# Patient Record
Sex: Male | Born: 1970
Health system: Southern US, Community
[De-identification: ages and names within clinical notes are randomized; demographics above are authoritative.]

## PROBLEM LIST (undated history)

## (undated) DIAGNOSIS — K219 Gastro-esophageal reflux disease without esophagitis: Secondary | ICD-10-CM

## (undated) DIAGNOSIS — E785 Hyperlipidemia, unspecified: Secondary | ICD-10-CM

## (undated) DIAGNOSIS — E669 Obesity, unspecified: Secondary | ICD-10-CM

## (undated) DIAGNOSIS — K5790 Diverticulosis of intestine, part unspecified, without perforation or abscess without bleeding: Secondary | ICD-10-CM

## (undated) HISTORY — DX: Hyperlipidemia, unspecified: E78.5

## (undated) HISTORY — DX: Gastro-esophageal reflux disease without esophagitis: K21.9

## (undated) HISTORY — DX: Obesity, unspecified: E66.9

## (undated) HISTORY — DX: Diverticulosis of intestine, part unspecified, without perforation or abscess without bleeding: K57.90

---

## 1997-05-28 ENCOUNTER — Emergency Department (HOSPITAL_COMMUNITY): Admission: EM | Admit: 1997-05-28 | Discharge: 1997-05-28 | Payer: Self-pay | Admitting: Emergency Medicine

## 1997-10-09 ENCOUNTER — Emergency Department (HOSPITAL_COMMUNITY): Admission: EM | Admit: 1997-10-09 | Discharge: 1997-10-09 | Payer: Self-pay | Admitting: Emergency Medicine

## 1997-11-03 ENCOUNTER — Emergency Department (HOSPITAL_COMMUNITY): Admission: EM | Admit: 1997-11-03 | Discharge: 1997-11-03 | Payer: Self-pay | Admitting: Emergency Medicine

## 1998-07-13 ENCOUNTER — Emergency Department (HOSPITAL_COMMUNITY): Admission: EM | Admit: 1998-07-13 | Discharge: 1998-07-13 | Payer: Self-pay | Admitting: Emergency Medicine

## 1998-12-21 ENCOUNTER — Emergency Department (HOSPITAL_COMMUNITY): Admission: EM | Admit: 1998-12-21 | Discharge: 1998-12-21 | Payer: Self-pay | Admitting: Emergency Medicine

## 1998-12-21 ENCOUNTER — Encounter: Payer: Self-pay | Admitting: Emergency Medicine

## 2000-01-08 ENCOUNTER — Encounter: Payer: Self-pay | Admitting: Emergency Medicine

## 2000-01-08 ENCOUNTER — Emergency Department (HOSPITAL_COMMUNITY): Admission: EM | Admit: 2000-01-08 | Discharge: 2000-01-08 | Payer: Self-pay | Admitting: Emergency Medicine

## 2000-02-10 ENCOUNTER — Emergency Department (HOSPITAL_COMMUNITY): Admission: EM | Admit: 2000-02-10 | Discharge: 2000-02-10 | Payer: Self-pay | Admitting: Emergency Medicine

## 2001-04-08 ENCOUNTER — Emergency Department (HOSPITAL_COMMUNITY): Admission: EM | Admit: 2001-04-08 | Discharge: 2001-04-08 | Payer: Self-pay | Admitting: Emergency Medicine

## 2001-12-03 ENCOUNTER — Emergency Department (HOSPITAL_COMMUNITY): Admission: EM | Admit: 2001-12-03 | Discharge: 2001-12-03 | Payer: Self-pay | Admitting: *Deleted

## 2002-10-25 ENCOUNTER — Emergency Department (HOSPITAL_COMMUNITY): Admission: EM | Admit: 2002-10-25 | Discharge: 2002-10-25 | Payer: Self-pay | Admitting: Emergency Medicine

## 2003-04-02 ENCOUNTER — Emergency Department (HOSPITAL_COMMUNITY): Admission: EM | Admit: 2003-04-02 | Discharge: 2003-04-02 | Payer: Self-pay | Admitting: Emergency Medicine

## 2003-06-26 ENCOUNTER — Emergency Department (HOSPITAL_COMMUNITY): Admission: EM | Admit: 2003-06-26 | Discharge: 2003-06-26 | Payer: Self-pay | Admitting: Emergency Medicine

## 2003-06-28 ENCOUNTER — Emergency Department (HOSPITAL_COMMUNITY): Admission: EM | Admit: 2003-06-28 | Discharge: 2003-06-29 | Payer: Self-pay | Admitting: Emergency Medicine

## 2004-03-17 ENCOUNTER — Emergency Department (HOSPITAL_COMMUNITY): Admission: EM | Admit: 2004-03-17 | Discharge: 2004-03-17 | Payer: Self-pay | Admitting: Emergency Medicine

## 2004-08-16 ENCOUNTER — Emergency Department (HOSPITAL_COMMUNITY): Admission: EM | Admit: 2004-08-16 | Discharge: 2004-08-16 | Payer: Self-pay | Admitting: Emergency Medicine

## 2005-07-20 ENCOUNTER — Emergency Department (HOSPITAL_COMMUNITY): Admission: EM | Admit: 2005-07-20 | Discharge: 2005-07-20 | Payer: Self-pay | Admitting: Emergency Medicine

## 2005-07-21 ENCOUNTER — Emergency Department (HOSPITAL_COMMUNITY): Admission: EM | Admit: 2005-07-21 | Discharge: 2005-07-22 | Payer: Self-pay | Admitting: *Deleted

## 2005-08-11 ENCOUNTER — Emergency Department (HOSPITAL_COMMUNITY): Admission: EM | Admit: 2005-08-11 | Discharge: 2005-08-11 | Payer: Self-pay | Admitting: Emergency Medicine

## 2005-08-16 ENCOUNTER — Emergency Department (HOSPITAL_COMMUNITY): Admission: EM | Admit: 2005-08-16 | Discharge: 2005-08-16 | Payer: Self-pay | Admitting: Emergency Medicine

## 2005-10-14 ENCOUNTER — Emergency Department (HOSPITAL_COMMUNITY): Admission: EM | Admit: 2005-10-14 | Discharge: 2005-10-15 | Payer: Self-pay | Admitting: Emergency Medicine

## 2006-01-28 ENCOUNTER — Emergency Department (HOSPITAL_COMMUNITY): Admission: EM | Admit: 2006-01-28 | Discharge: 2006-01-28 | Payer: Self-pay | Admitting: Emergency Medicine

## 2006-04-19 ENCOUNTER — Ambulatory Visit: Payer: Self-pay | Admitting: Internal Medicine

## 2006-05-01 ENCOUNTER — Ambulatory Visit: Payer: Self-pay | Admitting: *Deleted

## 2006-12-09 ENCOUNTER — Emergency Department (HOSPITAL_COMMUNITY): Admission: EM | Admit: 2006-12-09 | Discharge: 2006-12-09 | Payer: Self-pay | Admitting: Emergency Medicine

## 2010-10-26 ENCOUNTER — Emergency Department (HOSPITAL_COMMUNITY): Payer: Self-pay

## 2010-10-26 ENCOUNTER — Emergency Department (HOSPITAL_COMMUNITY)
Admission: EM | Admit: 2010-10-26 | Discharge: 2010-10-26 | Disposition: A | Discharge status: Home / Self Care | Payer: Self-pay | Attending: Emergency Medicine | Admitting: Emergency Medicine

## 2010-10-26 DIAGNOSIS — M79609 Pain in unspecified limb: Secondary | ICD-10-CM | POA: Insufficient documentation

## 2010-10-26 DIAGNOSIS — J45909 Unspecified asthma, uncomplicated: Secondary | ICD-10-CM | POA: Insufficient documentation

## 2010-11-24 LAB — GC/CHLAMYDIA PROBE AMP, GENITAL
Chlamydia, DNA Probe: NEGATIVE
GC Probe Amp, Genital: NEGATIVE

## 2010-11-24 LAB — URINALYSIS, ROUTINE W REFLEX MICROSCOPIC
Nitrite: NEGATIVE
Protein, ur: NEGATIVE
Specific Gravity, Urine: 1.027
Urobilinogen, UA: 1

## 2011-03-07 ENCOUNTER — Encounter (HOSPITAL_COMMUNITY): Payer: Self-pay | Admitting: Emergency Medicine

## 2011-03-07 ENCOUNTER — Emergency Department (HOSPITAL_COMMUNITY)
Admission: EM | Admit: 2011-03-07 | Discharge: 2011-03-07 | Disposition: A | Discharge status: Home / Self Care | Payer: Self-pay | Attending: Emergency Medicine | Admitting: Emergency Medicine

## 2011-03-07 DIAGNOSIS — N419 Inflammatory disease of prostate, unspecified: Secondary | ICD-10-CM | POA: Insufficient documentation

## 2011-03-07 DIAGNOSIS — R10819 Abdominal tenderness, unspecified site: Secondary | ICD-10-CM | POA: Insufficient documentation

## 2011-03-07 LAB — URINALYSIS, ROUTINE W REFLEX MICROSCOPIC
Bilirubin Urine: NEGATIVE
Glucose, UA: NEGATIVE mg/dL
Hgb urine dipstick: NEGATIVE
Ketones, ur: NEGATIVE mg/dL
Protein, ur: NEGATIVE mg/dL

## 2011-03-07 MED ORDER — CEFTRIAXONE SODIUM 250 MG IJ SOLR
250.0000 mg | Freq: Once | INTRAMUSCULAR | Status: AC
  Administered 2011-03-07: 250 mg via INTRAMUSCULAR
  Filled 2011-03-07: qty 250

## 2011-03-07 MED ORDER — LEVOFLOXACIN 750 MG PO TABS: 750.0000 mg | ORAL_TABLET | Freq: Every day | ORAL | Status: AC

## 2011-03-07 MED ORDER — LIDOCAINE HCL 1 % IJ SOLN
INTRAMUSCULAR | Status: AC
  Administered 2011-03-07: 20 mL
  Filled 2011-03-07: qty 20

## 2011-03-07 MED ORDER — AZITHROMYCIN 250 MG PO TABS
1000.0000 mg | ORAL_TABLET | Freq: Once | ORAL | Status: AC
  Administered 2011-03-07: 1000 mg via ORAL
  Filled 2011-03-07: qty 4

## 2011-03-07 NOTE — ED Notes (Signed)
Pt states his girlfriend tested positive for std and he wants to be checked  Pt states he has pain in his private area  Denies any discharge

## 2011-03-07 NOTE — ED Notes (Signed)
MD at bedside. EDP Ray

## 2011-03-07 NOTE — ED Notes (Signed)
Pt presents to ED w/concerns of possible STD, reports his ex girlfriend recently dx w/STD, pt reports small amt of d/c, burning w/urination, and lower abd pain. Pt unable to get in the STD clinic.

## 2011-03-07 NOTE — ED Provider Notes (Signed)
History     CSN: [id number]  Arrival date & time 03/07/11  7829   First MD Initiated Contact with Patient 03/07/11 2130      Chief Complaint  Patient presents with  . SEXUALLY TRANSMITTED DISEASE    (Consider location/radiation/quality/duration/timing/severity/associated sxs/prior treatment) HPI  Patient states she has had suprapubic pain for about 3 weeks. He has a history of prostatitis and feels that it is like this. He states that his girlfriend may have on tetanus TD and he is very concerned that he has an STD. He has not had any urethral discharge. He states that he has had some pain when he urinates. He has not had increased frequency of urination.  Past Medical History  Diagnosis Date  . Asthma     History reviewed. No pertinent past surgical history.  Family History  Problem Relation Age of Onset  . Hypertension Mother   . Diabetes Mother     History  Substance Use Topics  . Smoking status: Current Everyday Smoker    Types: Cigars  . Smokeless tobacco: Not on file  . Alcohol Use: Yes     occassional      Review of Systems  All other systems reviewed and are negative.    Allergies  Review of patient's allergies indicates no known allergies.  Home Medications  No current outpatient prescriptions on file.  BP 129/84  Pulse 78  Temp(Src) 98.4 F (36.9 C) (Oral)  Resp 18  SpO2 98%  Physical Exam  Nursing note and vitals reviewed. Constitutional: He is oriented to person, place, and time. He appears well-developed and well-nourished.  HENT:  Head: Normocephalic and atraumatic.  Eyes: Conjunctivae are normal. Pupils are equal, round, and reactive to light.  Neck: Normal range of motion.  Cardiovascular: Normal rate.   Pulmonary/Chest: Effort normal and breath sounds normal.  Abdominal: Soft. Bowel sounds are normal. There is tenderness.       Mild suprapubic tenderness  Genitourinary: Rectum normal and penis normal. Prostate is tender.    Musculoskeletal: Normal range of motion.  Neurological: He is alert and oriented to person, place, and time.  Skin: Skin is warm and dry.  Psychiatric: He has a normal mood and affect.    ED Course  Procedures (including critical care time)   Labs Reviewed  URINALYSIS, ROUTINE W REFLEX MICROSCOPIC   No results found.   No diagnosis found.    MDM  Plan to cover patient for STDs even possible exposure. He'll also be covered with Levaquin for 3 weeks given that he is 41 years old. A urine is sent and will be cultured. Will be referred to urology       Hilario Quarry, MD 03/09/11 [phone]

## 2011-03-10 NOTE — ED Notes (Signed)
Prescription called in to [facility] at [phone] for ciprofloxacin 500mg  po bid x28days per [facility], [facility]; no refills.

## 2011-12-08 ENCOUNTER — Emergency Department (HOSPITAL_COMMUNITY)
Admission: EM | Admit: 2011-12-08 | Discharge: 2011-12-09 | Disposition: A | Discharge status: Home / Self Care | Payer: Self-pay | Attending: Emergency Medicine | Admitting: Emergency Medicine

## 2011-12-08 ENCOUNTER — Emergency Department (HOSPITAL_COMMUNITY): Payer: Self-pay

## 2011-12-08 ENCOUNTER — Encounter (HOSPITAL_COMMUNITY): Payer: Self-pay | Admitting: Emergency Medicine

## 2011-12-08 DIAGNOSIS — J45901 Unspecified asthma with (acute) exacerbation: Secondary | ICD-10-CM

## 2011-12-08 DIAGNOSIS — F17200 Nicotine dependence, unspecified, uncomplicated: Secondary | ICD-10-CM | POA: Insufficient documentation

## 2011-12-08 MED ORDER — ALBUTEROL SULFATE HFA 108 (90 BASE) MCG/ACT IN AERS
1.0000 | INHALATION_SPRAY | Freq: Once | RESPIRATORY_TRACT | Status: AC
  Administered 2011-12-08: 1 via RESPIRATORY_TRACT
  Filled 2011-12-08: qty 6.7

## 2011-12-08 MED ORDER — ALBUTEROL SULFATE (5 MG/ML) 0.5% IN NEBU
5.0000 mg | INHALATION_SOLUTION | Freq: Once | RESPIRATORY_TRACT | Status: AC
  Administered 2011-12-08: 5 mg via RESPIRATORY_TRACT
  Filled 2011-12-08: qty 1

## 2011-12-08 MED ORDER — IPRATROPIUM BROMIDE 0.02 % IN SOLN
0.5000 mg | Freq: Once | RESPIRATORY_TRACT | Status: AC
  Administered 2011-12-08: 0.5 mg via RESPIRATORY_TRACT
  Filled 2011-12-08: qty 2.5

## 2011-12-08 MED ORDER — ALBUTEROL SULFATE HFA 108 (90 BASE) MCG/ACT IN AERS: 1.0000 | INHALATION_SPRAY | Freq: Four times a day (QID) | RESPIRATORY_TRACT | Status: DC | PRN

## 2011-12-08 NOTE — ED Provider Notes (Addendum)
History     CSN: [id number]  Arrival date & time 12/08/11  2049   First MD Initiated Contact with Patient 12/08/11 2100      Chief Complaint  Patient presents with  . Asthma    (Consider location/radiation/quality/duration/timing/severity/associated sxs/prior treatment) HPI Comments: Pt comes in with cc of wheezing. He has hx of asthma, but states that he hasnt had to use a breathing tx for years now. About 2 hours ago, he started having wheezing all of a sudden, with no chest pain or dib. He has certain seasonal allergies, but cant recall any specific exposures. There is no chest pain, no substance abuse, no cough.  Patient is a 41 y.o. male presenting with asthma. The history is provided by the patient.  Asthma Pertinent negatives include no chest pain, no abdominal pain and no shortness of breath.    Past Medical History  Diagnosis Date  . Asthma     History reviewed. No pertinent past surgical history.  Family History  Problem Relation Age of Onset  . Hypertension Mother   . Diabetes Mother     History  Substance Use Topics  . Smoking status: Current Every Day Smoker    Types: Cigars  . Smokeless tobacco: Not on file  . Alcohol Use: Yes     occassional      Review of Systems  Constitutional: Negative for activity change and appetite change.  Respiratory: Positive for wheezing. Negative for cough and shortness of breath.   Cardiovascular: Negative for chest pain.  Gastrointestinal: Negative for abdominal pain.  Genitourinary: Negative for dysuria.    Allergies  Review of patient's allergies indicates no known allergies.  Home Medications  No current outpatient prescriptions on file.  BP 167/98  Pulse 87  Temp 98.3 F (36.8 C) (Oral)  Resp 16  SpO2 95%  Physical Exam  Nursing note and vitals reviewed. Constitutional: He is oriented to person, place, and time. He appears well-developed.  HENT:  Head: Normocephalic and atraumatic.  Eyes:  Conjunctivae normal and EOM are normal. Pupils are equal, round, and reactive to light.  Neck: Normal range of motion. Neck supple.  Cardiovascular: Normal rate and regular rhythm.   Pulmonary/Chest: Effort normal. He has wheezes.       Bilateral lower lobe wheezing s/p 1 breathing treatment  Abdominal: Soft. Bowel sounds are normal. He exhibits no distension. There is no tenderness. There is no rebound and no guarding.  Neurological: He is alert and oriented to person, place, and time.  Skin: Skin is warm.    ED Course  Procedures (including critical care time)  Labs Reviewed - No data to display Dg Chest 2 View  12/08/2011  *RADIOLOGY REPORT*  Clinical Data: Asthma, chest pain, cough and wheezing.  CHEST - 2 VIEW  Comparison: 06/26/2003  Findings: Hypoaeration with interstitial and vascular crowding.  No confluent airspace opacity.  No pleural effusion or pneumothorax. Cardiomediastinal contours within normal range.  No acute osseous finding.  IMPRESSION: Hypoaerated lungs without focal consolidation.   Original Report Authenticated By: Waneta Martins, M.D.      No diagnosis found.    MDM  DDX: Asthma exacerbation Allergy Bronchitis CHF Pneumonia PE  Pt comes in with cc of wheezing. Pt is s/p 1 treatment, and he feels a lot better. He still has mild wheezing. Given that he never took any treatment, and his exam shows no extremis, IF HE IS WHEEZING FREE AFTER 2 TREATMENTS, we will not call him asthma exacerbation  and he will not get steroids. PE ruled out per [facility] criteria.  Derwood Kaplan, MD 12/08/11 2240  Xray negative, post 2nd neb treatment, he is wheezing free. Has no insurance, will give him albuterol inhaler.  Derwood Kaplan, MD 12/08/11 2242

## 2011-12-08 NOTE — ED Notes (Signed)
PT. REPORTS ASTHMA ATTACK TODAY WITH SOB AND OCCASIONAL DRY COUGH / FAINT WHEEZING .

## 2011-12-12 ENCOUNTER — Encounter (HOSPITAL_COMMUNITY): Payer: Self-pay | Admitting: Cardiology

## 2011-12-12 ENCOUNTER — Emergency Department (HOSPITAL_COMMUNITY)
Admission: EM | Admit: 2011-12-12 | Discharge: 2011-12-12 | Disposition: A | Discharge status: Home / Self Care | Payer: Self-pay | Attending: Emergency Medicine | Admitting: Emergency Medicine

## 2011-12-12 DIAGNOSIS — F17200 Nicotine dependence, unspecified, uncomplicated: Secondary | ICD-10-CM | POA: Insufficient documentation

## 2011-12-12 DIAGNOSIS — J45901 Unspecified asthma with (acute) exacerbation: Secondary | ICD-10-CM | POA: Insufficient documentation

## 2011-12-12 MED ORDER — IPRATROPIUM BROMIDE 0.02 % IN SOLN
0.5000 mg | Freq: Once | RESPIRATORY_TRACT | Status: AC
  Administered 2011-12-12: 0.5 mg via RESPIRATORY_TRACT
  Filled 2011-12-12: qty 2.5

## 2011-12-12 MED ORDER — DEXAMETHASONE 4 MG PO TABS: 16.0000 mg | ORAL_TABLET | Freq: Once | ORAL | Status: DC

## 2011-12-12 MED ORDER — ALBUTEROL SULFATE (5 MG/ML) 0.5% IN NEBU
5.0000 mg | INHALATION_SOLUTION | Freq: Once | RESPIRATORY_TRACT | Status: AC
  Administered 2011-12-12: 5 mg via RESPIRATORY_TRACT
  Filled 2011-12-12: qty 1

## 2011-12-12 MED ORDER — ALBUTEROL SULFATE HFA 108 (90 BASE) MCG/ACT IN AERS
2.0000 | INHALATION_SPRAY | Freq: Once | RESPIRATORY_TRACT | Status: AC
  Administered 2011-12-12: 2 via RESPIRATORY_TRACT
  Filled 2011-12-12: qty 6.7

## 2011-12-12 MED ORDER — DEXAMETHASONE 2 MG PO TABS
16.0000 mg | ORAL_TABLET | Freq: Once | ORAL | Status: AC
  Administered 2011-12-12: 16 mg via ORAL
  Filled 2011-12-12: qty 8

## 2011-12-12 NOTE — ED Notes (Signed)
Pt reports increased SOB that started over the night. Reports he uses an inhaler but it has not been working. Pt speaking in complete sentences. No distress noted. Pt ambulatory at triage.

## 2011-12-12 NOTE — ED Provider Notes (Signed)
History  This chart was scribed for Raeford Razor, MD by Shari Heritage. The patient was seen in room TR10C/TR10C. Patient's care was started at 1716.    CSN: [id number]  Arrival date & time 12/12/11  1716   First MD Initiated Contact with Patient 12/12/11 1716      Chief Complaint  Patient presents with  . Asthma    Patient is a 41 y.o. male presenting with shortness of breath. The history is provided by the patient. No language interpreter was used.  Shortness of Breath  The current episode started yesterday. The onset was sudden. The problem occurs frequently. The problem is moderate. Associated symptoms include shortness of breath. Pertinent negatives include no fever. His past medical history is significant for asthma.    Jacob Williamson is a 41 y.o. male with a history of asthma who presents to the Emergency Department complaining of moderate, intermittent SOB. He denies fever, chills, cough, leg pain, or leg swelling onset last night. He states that he has not had asthmatic symptoms in the past several  years. He was seen here on 10/24 for complaints of wheezing and was given an inhaler.. After treatment in the ED patient had relief for a few hours but symptoms returned. He reports no other significant past medical history. Patient has a history of smoking.       Past Medical History  Diagnosis Date  . Asthma     History reviewed. No pertinent past surgical history.  Family History  Problem Relation Age of Onset  . Hypertension Mother   . Diabetes Mother     History  Substance Use Topics  . Smoking status: Current Every Day Smoker    Types: Cigars  . Smokeless tobacco: Not on file  . Alcohol Use: Yes     occassional      Review of Systems  Constitutional: Negative for fever, chills and fatigue.  Respiratory: Positive for shortness of breath.   All other systems reviewed and are negative.    Allergies  Catfish and Other  Home Medications   Current  Outpatient Rx  Name Route Sig Dispense Refill  . ALBUTEROL SULFATE HFA 108 (90 BASE) MCG/ACT IN AERS Inhalation Inhale 1-2 puffs into the lungs every 6 (six) hours as needed. For shortness of breath/wheezing      BP 125/102  Pulse 110  Temp 98 F (36.7 C) (Oral)  Resp 18  SpO2 100%  Physical Exam  Nursing note and vitals reviewed. Constitutional: He appears well-developed and well-nourished. No distress.  HENT:  Head: Normocephalic and atraumatic.  Eyes: Conjunctivae normal are normal. Right eye exhibits no discharge. Left eye exhibits no discharge.  Neck: Neck supple.  Cardiovascular: Normal rate, regular rhythm and normal heart sounds.  Exam reveals no gallop and no friction rub.   No murmur heard. Pulmonary/Chest: Effort normal. No respiratory distress. He has wheezes (Mild ).       Mild expiratory wheezes. Patient is speaking normally.   Abdominal: Soft. He exhibits no distension. There is no tenderness.  Musculoskeletal: He exhibits no edema and no tenderness.  Neurological: He is alert.  Skin: Skin is warm and dry.  Psychiatric: He has a normal mood and affect. His behavior is normal. Thought content normal.    ED Course  Procedures (including critical care time) DIAGNOSTIC STUDIES: Oxygen Saturation is 100% on room air, normal by my interpretation.    COORDINATION OF CARE:  5:30pm-Patient informed of current plan for treatment and evaluation and  agrees with plan at this time.    Labs Reviewed - No data to display No results found.   1. Asthma exacerbation       MDM  41yM with mild dyspnea and wheezing on exam. Consistent with mild asthma exacerbation. Seen recently for same on not started on steroids. Will give course given ongoing symptoms. Afebrile and no respiratory distress. Imaging and labs deferred. PE considered but doubt. Pt somewhat anxious because wheezing not completely cleared. Remains in no respiratory distress on repeat exam. Given reassurance.  Return precautions discussed.  I personally preformed the services scribed in my presence. The recorded information has been reviewed and considered. Raeford Razor, MD.       Raeford Razor, MD 12/12/11 [phone]

## 2012-02-28 ENCOUNTER — Emergency Department (HOSPITAL_COMMUNITY)
Admission: EM | Admit: 2012-02-28 | Discharge: 2012-02-28 | Disposition: A | Discharge status: Home / Self Care | Payer: Self-pay | Attending: Emergency Medicine | Admitting: Emergency Medicine

## 2012-02-28 ENCOUNTER — Encounter (HOSPITAL_COMMUNITY): Payer: Self-pay | Admitting: *Deleted

## 2012-02-28 DIAGNOSIS — J45901 Unspecified asthma with (acute) exacerbation: Secondary | ICD-10-CM | POA: Insufficient documentation

## 2012-02-28 DIAGNOSIS — R062 Wheezing: Secondary | ICD-10-CM

## 2012-02-28 DIAGNOSIS — F17200 Nicotine dependence, unspecified, uncomplicated: Secondary | ICD-10-CM | POA: Insufficient documentation

## 2012-02-28 DIAGNOSIS — R0602 Shortness of breath: Secondary | ICD-10-CM | POA: Insufficient documentation

## 2012-02-28 MED ORDER — ALBUTEROL SULFATE (5 MG/ML) 0.5% IN NEBU
INHALATION_SOLUTION | RESPIRATORY_TRACT | Status: AC
  Administered 2012-02-28: 21:00:00
  Filled 2012-02-28: qty 1

## 2012-02-28 MED ORDER — ALBUTEROL SULFATE (5 MG/ML) 0.5% IN NEBU
5.0000 mg | INHALATION_SOLUTION | Freq: Once | RESPIRATORY_TRACT | Status: AC
  Administered 2012-02-28: 5 mg via RESPIRATORY_TRACT

## 2012-02-28 MED ORDER — ALBUTEROL SULFATE HFA 108 (90 BASE) MCG/ACT IN AERS: 1.0000 | INHALATION_SPRAY | Freq: Four times a day (QID) | RESPIRATORY_TRACT | Status: DC | PRN

## 2012-02-28 NOTE — ED Notes (Signed)
Called in lobby at 19:40 with no response.

## 2012-02-28 NOTE — ED Notes (Signed)
Pt with hx of asthma to ED c/o acute onset sob and wheezing while at work d/t dust.  Pt o2 sats of 97, but with audible wheezing.

## 2012-02-28 NOTE — ED Provider Notes (Signed)
History   This chart was scribed for non-physician practitioner working with Dione Booze, MD by Sofie Rower, ED Scribe. This patient was seen in room TR11C/TR11C and the patient's care was started at 9:06PM.   CSN: [id number]  Arrival date & time 02/28/12  1821   First MD Initiated Contact with Patient 02/28/12 2106      Chief Complaint  Patient presents with  . Asthma    (Consider location/radiation/quality/duration/timing/severity/associated sxs/prior treatment) Patient is a 42 y.o. male presenting with asthma and general illness. The history is provided by the patient. No language interpreter was used.  Asthma This is a recurrent problem. The problem has been gradually worsening. Associated symptoms include shortness of breath. The symptoms are aggravated by exertion. The symptoms are relieved by medications (benadryl). He has tried nothing for the symptoms. The treatment provided no relief.  Illness  The current episode started today. The onset was sudden. The problem occurs rarely. The problem has been gradually worsening. The problem is moderate. The symptoms are relieved by one or more OTC medications. The symptoms are aggravated by activity. Associated symptoms include wheezing.    Jacob Williamson is a 42 y.o. male , with a hx of asthma, who presents to the Emergency Department complaining of sudden, progressively worsening, asthma, onset today (02/28/12).  Associated symptoms include wheezing. The pt reports he was at his place of employment this afternoon, where he believes he experienced an asthma attack. This event prompted the pt's concern and desire to recieve medical evaluation at [facility] this evening. The pt has taken benadryl in the past, which he informs, he believes has prevented his asthma attacks.   The pt denies taking any applications of albuterol inhaler rescue treatments due to financial restrictions.   The pt is a current everyday smoker, in addition to drinking  alcohol occasionally.   PCP is Dr. Bruna Potter.     Past Medical History  Diagnosis Date  . Asthma     History reviewed. No pertinent past surgical history.  Family History  Problem Relation Age of Onset  . Hypertension Mother   . Diabetes Mother     History  Substance Use Topics  . Smoking status: Current Every Day Smoker    Types: Cigars  . Smokeless tobacco: Not on file  . Alcohol Use: Yes     Comment: occassional      Review of Systems  Respiratory: Positive for shortness of breath and wheezing.   All other systems reviewed and are negative.    Allergies  Shellfish allergy; Catfish; and Other  Home Medications   Current Outpatient Rx  Name  Route  Sig  Dispense  Refill  . DIPHENHYDRAMINE HCL 25 MG PO TABS   Oral   Take 25 mg by mouth 2 (two) times daily as needed. For reaction-inducing asthma           BP 138/110  Pulse 93  Temp 98.5 F (36.9 C) (Oral)  Resp 24  SpO2 97%  Physical Exam  Nursing note and vitals reviewed. Constitutional: He is oriented to person, place, and time. He appears well-developed and well-nourished.  HENT:  Head: Atraumatic.  Nose: Nose normal.  Neck: Normal range of motion.  Cardiovascular: Normal rate, regular rhythm and normal heart sounds.   Pulmonary/Chest: Effort normal and breath sounds normal. He has no wheezes.  Abdominal: Soft. Bowel sounds are normal.  Musculoskeletal: Normal range of motion.  Neurological: He is alert and oriented to person, place, and time.  Skin: Skin is warm and dry.  Psychiatric: He has a normal mood and affect. His behavior is normal.    ED Course  Procedures (including critical care time)  DIAGNOSTIC STUDIES: Oxygen Saturation is 97% on room air, normal by my interpretation.    COORDINATION OF CARE:  9:14 PM- Treatment plan concerning application of albuterol inhaler discussed with patient. Pt agrees with treatment.      Labs Reviewed - No data to display No results  found.   No diagnosis found.  Episode of wheezing after exposure to dust at work.  Initial symptoms have totally resolve.  Patient is followed by the [facility] for ongoing care.  Is currently out of his inhaler.  Refill prescription provided.  MDM    I personally performed the services described in this documentation, which was scribed in my presence. The recorded information has been reviewed and is accurate.        Jimmye Norman, NP 02/29/12 0010

## 2012-02-28 NOTE — ED Notes (Signed)
Pt returned from getting food.  Pt ambulatory back to [facility]

## 2012-02-29 NOTE — ED Provider Notes (Signed)
Medical screening examination/treatment/procedure(s) were performed by non-physician practitioner and as supervising physician I was immediately available for consultation/collaboration.   Dione Booze, MD 02/29/12 [phone]

## 2012-03-01 ENCOUNTER — Emergency Department (HOSPITAL_COMMUNITY): Payer: Self-pay

## 2012-03-01 ENCOUNTER — Encounter (HOSPITAL_COMMUNITY): Payer: Self-pay | Admitting: Adult Health

## 2012-03-01 ENCOUNTER — Emergency Department (HOSPITAL_COMMUNITY)
Admission: EM | Admit: 2012-03-01 | Discharge: 2012-03-01 | Disposition: A | Discharge status: Home / Self Care | Payer: Self-pay | Attending: Emergency Medicine | Admitting: Emergency Medicine

## 2012-03-01 DIAGNOSIS — R062 Wheezing: Secondary | ICD-10-CM | POA: Insufficient documentation

## 2012-03-01 DIAGNOSIS — Z79899 Other long term (current) drug therapy: Secondary | ICD-10-CM | POA: Insufficient documentation

## 2012-03-01 DIAGNOSIS — J45901 Unspecified asthma with (acute) exacerbation: Secondary | ICD-10-CM | POA: Insufficient documentation

## 2012-03-01 DIAGNOSIS — F17200 Nicotine dependence, unspecified, uncomplicated: Secondary | ICD-10-CM | POA: Insufficient documentation

## 2012-03-01 MED ORDER — ALBUTEROL (5 MG/ML) CONTINUOUS INHALATION SOLN: 20.0000 mg | INHALATION_SOLUTION | Freq: Once | RESPIRATORY_TRACT | Status: DC

## 2012-03-01 MED ORDER — ALBUTEROL SULFATE (5 MG/ML) 0.5% IN NEBU: 2.5000 mg | INHALATION_SOLUTION | Freq: Once | RESPIRATORY_TRACT | Status: DC

## 2012-03-01 MED ORDER — IPRATROPIUM BROMIDE 0.02 % IN SOLN
0.5000 mg | Freq: Once | RESPIRATORY_TRACT | Status: AC
  Administered 2012-03-01: 0.5 mg via RESPIRATORY_TRACT

## 2012-03-01 MED ORDER — ALBUTEROL (5 MG/ML) CONTINUOUS INHALATION SOLN
20.0000 mg | INHALATION_SOLUTION | Freq: Once | RESPIRATORY_TRACT | Status: AC
  Administered 2012-03-01: 20 mg via RESPIRATORY_TRACT
  Filled 2012-03-01: qty 20

## 2012-03-01 MED ORDER — METHYLPREDNISOLONE SODIUM SUCC 125 MG IJ SOLR
125.0000 mg | Freq: Once | INTRAMUSCULAR | Status: AC
  Administered 2012-03-01: 125 mg via INTRAVENOUS
  Filled 2012-03-01: qty 2

## 2012-03-01 MED ORDER — IPRATROPIUM BROMIDE 0.02 % IN SOLN
RESPIRATORY_TRACT | Status: AC
  Filled 2012-03-01: qty 2.5

## 2012-03-01 MED ORDER — ALBUTEROL SULFATE (5 MG/ML) 0.5% IN NEBU
INHALATION_SOLUTION | RESPIRATORY_TRACT | Status: AC
  Filled 2012-03-01: qty 1

## 2012-03-01 MED ORDER — ALBUTEROL SULFATE HFA 108 (90 BASE) MCG/ACT IN AERS
2.0000 | INHALATION_SPRAY | Freq: Once | RESPIRATORY_TRACT | Status: AC
  Administered 2012-03-01: 2 via RESPIRATORY_TRACT
  Filled 2012-03-01: qty 6.7

## 2012-03-01 MED ORDER — ALBUTEROL SULFATE (5 MG/ML) 0.5% IN NEBU
5.0000 mg | INHALATION_SOLUTION | Freq: Once | RESPIRATORY_TRACT | Status: AC
  Administered 2012-03-01: 5 mg via RESPIRATORY_TRACT

## 2012-03-01 MED ORDER — PREDNISONE 20 MG PO TABS: ORAL_TABLET | ORAL | Status: DC

## 2012-03-01 NOTE — ED Provider Notes (Signed)
History     CSN: [id number]  Arrival date & time 03/01/12  0054   First MD Initiated Contact with Patient 03/01/12 [phone]      Chief Complaint  Patient presents with  . Asthma    (Consider location/radiation/quality/duration/timing/severity/associated sxs/prior treatment) Patient is a 42 y.o. male presenting with asthma. The history is provided by the patient. No language interpreter was used.  Asthma This is a recurrent problem. The current episode started 3 to 5 hours ago. The problem occurs constantly. The problem has not changed since onset.Pertinent negatives include no chest pain, no abdominal pain and no headaches. Nothing aggravates the symptoms. Nothing relieves the symptoms. He has tried nothing for the symptoms. The treatment provided no relief.    Past Medical History  Diagnosis Date  . Asthma     History reviewed. No pertinent past surgical history.  Family History  Problem Relation Age of Onset  . Hypertension Mother   . Diabetes Mother     History  Substance Use Topics  . Smoking status: Current Every Day Smoker    Types: Cigars  . Smokeless tobacco: Not on file  . Alcohol Use: Yes     Comment: occassional      Review of Systems  Constitutional: Negative for fever.  Respiratory: Positive for wheezing. Negative for cough and stridor.   Cardiovascular: Negative for chest pain.  Gastrointestinal: Negative for abdominal pain.  Neurological: Negative for headaches.  All other systems reviewed and are negative.    Allergies  Shellfish allergy; Catfish; and Other  Home Medications   Current Outpatient Rx  Name  Route  Sig  Dispense  Refill  . DIPHENHYDRAMINE HCL 25 MG PO TABS   Oral   Take 25 mg by mouth 2 (two) times daily as needed. For reaction-inducing asthma         . ALBUTEROL SULFATE HFA 108 (90 BASE) MCG/ACT IN AERS   Inhalation   Inhale 1-2 puffs into the lungs every 6 (six) hours as needed for wheezing.   1 Inhaler   0     BP  146/103  Pulse 95  Temp 98.2 F (36.8 C) (Oral)  Resp 20  SpO2 98%  Physical Exam  Constitutional: He is oriented to person, place, and time. He appears well-developed and well-nourished.  HENT:  Head: Normocephalic and atraumatic.  Mouth/Throat: Oropharynx is clear and moist.  Eyes: Conjunctivae normal are normal. Pupils are equal, round, and reactive to light.  Neck: Normal range of motion.  Cardiovascular: Normal rate, regular rhythm and intact distal pulses.   Pulmonary/Chest: No stridor. He has wheezes. He has no rales.  Abdominal: Soft. Bowel sounds are normal. There is no tenderness. There is no rebound and no guarding.  Musculoskeletal: Normal range of motion. He exhibits no edema.  Lymphadenopathy:    He has no cervical adenopathy.  Neurological: He is alert and oriented to person, place, and time.  Skin: Skin is warm and dry. He is not diaphoretic.  Psychiatric: He has a normal mood and affect.    ED Course  Procedures (including critical care time)  Labs Reviewed - No data to display Dg Chest [facility] 1 View  03/01/2012  *RADIOLOGY REPORT*  Clinical Data: Chest tightness, cough, wheezing.  PORTABLE CHEST - 1 VIEW  Comparison: 12/08/2011  Findings: Lungs clear.  Heart size and pulmonary vascularity normal.  No effusion.  Visualized bones unremarkable.  IMPRESSION: No acute disease   Original Report Authenticated By: D. Andria Rhein, MD  No diagnosis found.    MDM  Asthma exacerbation. Clear post nebs teach and treat done with spacer.  Will d/c with same and steroids        Jefferie Holston K Madden Garron-Rasch, MD 03/01/12 [phone]

## 2012-03-01 NOTE — ED Notes (Signed)
Respiratory in with pt

## 2012-03-01 NOTE — ED Notes (Signed)
Presents with Asthma, bilateral breath sounds diminished, faint expiratory wheezes, pt is tripoding and using abdominal muscles to breath. Able to speak in short phrases.

## 2012-06-18 ENCOUNTER — Emergency Department (HOSPITAL_COMMUNITY)
Admission: EM | Admit: 2012-06-18 | Discharge: 2012-06-18 | Disposition: A | Discharge status: Home / Self Care | Payer: 59 | Attending: Emergency Medicine | Admitting: Emergency Medicine

## 2012-06-18 ENCOUNTER — Encounter (HOSPITAL_COMMUNITY): Payer: Self-pay | Admitting: Emergency Medicine

## 2012-06-18 DIAGNOSIS — J3489 Other specified disorders of nose and nasal sinuses: Secondary | ICD-10-CM | POA: Insufficient documentation

## 2012-06-18 DIAGNOSIS — F17200 Nicotine dependence, unspecified, uncomplicated: Secondary | ICD-10-CM | POA: Insufficient documentation

## 2012-06-18 DIAGNOSIS — H5789 Other specified disorders of eye and adnexa: Secondary | ICD-10-CM | POA: Insufficient documentation

## 2012-06-18 DIAGNOSIS — J45901 Unspecified asthma with (acute) exacerbation: Secondary | ICD-10-CM

## 2012-06-18 DIAGNOSIS — Z79899 Other long term (current) drug therapy: Secondary | ICD-10-CM | POA: Insufficient documentation

## 2012-06-18 DIAGNOSIS — R0602 Shortness of breath: Secondary | ICD-10-CM | POA: Insufficient documentation

## 2012-06-18 DIAGNOSIS — J302 Other seasonal allergic rhinitis: Secondary | ICD-10-CM

## 2012-06-18 DIAGNOSIS — IMO0002 Reserved for concepts with insufficient information to code with codable children: Secondary | ICD-10-CM | POA: Insufficient documentation

## 2012-06-18 MED ORDER — ALBUTEROL SULFATE (5 MG/ML) 0.5% IN NEBU
5.0000 mg | INHALATION_SOLUTION | Freq: Once | RESPIRATORY_TRACT | Status: AC
  Administered 2012-06-18: 5 mg via RESPIRATORY_TRACT

## 2012-06-18 MED ORDER — LORATADINE 10 MG PO TABS: 10.0000 mg | ORAL_TABLET | Freq: Every day | ORAL | Status: DC

## 2012-06-18 MED ORDER — PREDNISONE 20 MG PO TABS
60.0000 mg | ORAL_TABLET | Freq: Once | ORAL | Status: AC
  Administered 2012-06-18: 60 mg via ORAL
  Filled 2012-06-18: qty 3

## 2012-06-18 MED ORDER — ALBUTEROL SULFATE HFA 108 (90 BASE) MCG/ACT IN AERS
2.0000 | INHALATION_SPRAY | RESPIRATORY_TRACT | Status: DC | PRN
  Administered 2012-06-18: 2 via RESPIRATORY_TRACT
  Filled 2012-06-18: qty 6.7

## 2012-06-18 MED ORDER — LORATADINE 10 MG PO TABS
10.0000 mg | ORAL_TABLET | Freq: Every day | ORAL | Status: DC
  Administered 2012-06-18: 10 mg via ORAL
  Filled 2012-06-18: qty 1

## 2012-06-18 MED ORDER — PREDNISONE 50 MG PO TABS: 50.0000 mg | ORAL_TABLET | Freq: Every day | ORAL | Status: DC

## 2012-06-18 MED ORDER — ALBUTEROL SULFATE HFA 108 (90 BASE) MCG/ACT IN AERS: 1.0000 | INHALATION_SPRAY | Freq: Four times a day (QID) | RESPIRATORY_TRACT | Status: DC | PRN

## 2012-06-18 MED ORDER — AEROCHAMBER PLUS W/MASK MISC
1.0000 | Freq: Once | Status: AC
  Administered 2012-06-18: 1
  Filled 2012-06-18: qty 1

## 2012-06-18 MED ORDER — IPRATROPIUM BROMIDE 0.02 % IN SOLN
0.5000 mg | RESPIRATORY_TRACT | Status: DC
  Administered 2012-06-18: 0.5 mg via RESPIRATORY_TRACT
  Filled 2012-06-18: qty 2.5

## 2012-06-18 MED ORDER — ALBUTEROL SULFATE (5 MG/ML) 0.5% IN NEBU
5.0000 mg | INHALATION_SOLUTION | RESPIRATORY_TRACT | Status: DC
  Administered 2012-06-18: 5 mg via RESPIRATORY_TRACT
  Filled 2012-06-18 (×2): qty 0.5

## 2012-06-18 MED ORDER — ALBUTEROL SULFATE (5 MG/ML) 0.5% IN NEBU
INHALATION_SOLUTION | RESPIRATORY_TRACT | Status: AC
  Administered 2012-06-18: 5 mg via RESPIRATORY_TRACT
  Filled 2012-06-18: qty 1

## 2012-06-18 NOTE — ED Notes (Signed)
PT. REPORTS SOB WITH AUDIBLE WHEEZING ONSET THIS EVENING , PT. STATES HE RAN OUT OF [facility] . NO COUGH OR CONGESTION , DENIES FEVER OR CHILLS.

## 2012-06-18 NOTE — ED Provider Notes (Signed)
History     CSN: [id number]  Arrival date & time 06/18/12  0025   First MD Initiated Contact with Patient 06/18/12 0231      Chief Complaint  Patient presents with  . Asthma    HPI Jacob Williamson is a 42 y.o. male with a history of asthma who says he ran out of his asthma inhaler. Patient does have a followup appointment with Dr. Bruna Potter but not for another week. Patient says he notices his symptoms are worse in seasons change he also associated with rhinorrhea, itchy watery eyes. Patient has been taking Allegra and Claritin sporadically.  Shortness of breath and wheezing acutely worsened tonight, moderate, not associated with chest pain, fevers or chills, nausea vomiting diarrhea, abdominal pain, myalgias or arthralgias.   Past Medical History  Diagnosis Date  . Asthma     History reviewed. No pertinent past surgical history.  Family History  Problem Relation Age of Onset  . Hypertension Mother   . Diabetes Mother     History  Substance Use Topics  . Smoking status: Current Every Day Smoker    Types: Cigars  . Smokeless tobacco: Not on file  . Alcohol Use: Yes     Comment: occassional      Review of Systems At least 10pt or greater review of systems completed and are negative except where specified in the HPI.  Allergies  Shellfish allergy; Catfish; and Other  Home Medications   Current Outpatient Rx  Name  Route  Sig  Dispense  Refill  . albuterol (PROVENTIL HFA;VENTOLIN HFA) 108 (90 BASE) MCG/ACT inhaler   Inhalation   Inhale 1-2 puffs into the lungs every 6 (six) hours as needed for wheezing.   1 Inhaler   0   . loratadine (CLARITIN) 10 MG tablet   Oral   Take 1 tablet (10 mg total) by mouth daily.   30 tablet   0   . predniSONE (DELTASONE) 50 MG tablet   Oral   Take 1 tablet (50 mg total) by mouth daily.   5 tablet   0     BP 157/113  Pulse 93  Temp(Src) 98 F (36.7 C) (Oral)  Resp 14  SpO2 94%  Physical Exam  Nursing notes  reviewed.  Electronic medical record reviewed. VITAL SIGNS:   Filed Vitals:   06/18/12 0054 06/18/12 0329  BP: 157/113   Pulse: 93   Temp: 98 F (36.7 C)   TempSrc: Oral   Resp: 14   SpO2: 96% 94%   CONSTITUTIONAL: Awake, oriented, appears non-toxic HENT: Atraumatic, normocephalic, oral mucosa pink and moist, airway patent. Nares patent without drainage. External ears normal. EYES: Conjunctiva clear, EOMI, PERRLA NECK: Trachea midline, non-tender, supple CARDIOVASCULAR: Normal heart rate, Normal rhythm, No murmurs, rubs, gallops PULMONARY/CHEST: Fair air movement bilaterally, wheezing throughout. Symmetrical breath sounds. Non-tender. ABDOMINAL: Non-distended, soft, non-tender - no rebound or guarding.  BS normal. NEUROLOGIC: Non-focal, moving all four extremities, no gross sensory or motor deficits. EXTREMITIES: No clubbing, cyanosis, or edema SKIN: Warm, Dry, No erythema, No rash  ED Course  Procedures (including critical care time)  Labs Reviewed - No data to display No results found.   1. Asthma exacerbation   2. Seasonal allergies       MDM  Patient presents with asthma exacerbation likely touched off by some seasonal allergies, he's also out of his medications. Advised the patient to stay on an antihistamine to prevent asthmatic cascade, we'll start him on prednisone give him  another inhaler and prescription for inhaler. Told him to discuss further treatment with Dr.[facility], as he will need to be on a long acting beta agonist, likely inhaled steroid controller medication. No evidence for pneumonia.         Jones Skene, MD 06/18/12 [phone]

## 2013-11-20 ENCOUNTER — Emergency Department (HOSPITAL_BASED_OUTPATIENT_CLINIC_OR_DEPARTMENT_OTHER)
Admission: EM | Admit: 2013-11-20 | Discharge: 2013-11-20 | Disposition: A | Discharge status: Home / Self Care | Payer: 59 | Attending: Emergency Medicine | Admitting: Emergency Medicine

## 2013-11-20 ENCOUNTER — Encounter (HOSPITAL_BASED_OUTPATIENT_CLINIC_OR_DEPARTMENT_OTHER): Payer: Self-pay | Admitting: Emergency Medicine

## 2013-11-20 DIAGNOSIS — Z720 Tobacco use: Secondary | ICD-10-CM | POA: Insufficient documentation

## 2013-11-20 DIAGNOSIS — J45901 Unspecified asthma with (acute) exacerbation: Secondary | ICD-10-CM | POA: Insufficient documentation

## 2013-11-20 DIAGNOSIS — Z7952 Long term (current) use of systemic steroids: Secondary | ICD-10-CM | POA: Insufficient documentation

## 2013-11-20 DIAGNOSIS — Z79899 Other long term (current) drug therapy: Secondary | ICD-10-CM | POA: Insufficient documentation

## 2013-11-20 MED ORDER — ALBUTEROL SULFATE HFA 108 (90 BASE) MCG/ACT IN AERS
2.0000 | INHALATION_SPRAY | RESPIRATORY_TRACT | Status: DC
  Administered 2013-11-20: 2 via RESPIRATORY_TRACT
  Filled 2013-11-20: qty 6.7

## 2013-11-20 MED ORDER — ALBUTEROL SULFATE (2.5 MG/3ML) 0.083% IN NEBU
5.0000 mg | INHALATION_SOLUTION | Freq: Once | RESPIRATORY_TRACT | Status: AC
  Administered 2013-11-20: 5 mg via RESPIRATORY_TRACT
  Filled 2013-11-20: qty 6

## 2013-11-20 MED ORDER — ALBUTEROL SULFATE HFA 108 (90 BASE) MCG/ACT IN AERS: 1.0000 | INHALATION_SPRAY | RESPIRATORY_TRACT | Status: DC | PRN

## 2013-11-20 MED ORDER — PREDNISONE 50 MG PO TABS
60.0000 mg | ORAL_TABLET | Freq: Once | ORAL | Status: AC
  Administered 2013-11-20: 60 mg via ORAL
  Filled 2013-11-20 (×2): qty 1

## 2013-11-20 MED ORDER — PREDNISONE 10 MG PO TABS: 60.0000 mg | ORAL_TABLET | Freq: Every day | ORAL | Status: DC

## 2013-11-20 NOTE — ED Notes (Signed)
Pt started developing a cough yesterday evening, reports history of asthma, does not have any refills for inhalers at home

## 2013-11-20 NOTE — Patient Instructions (Signed)
Instructed patient on the proper use of administering albuterol mdi via aerochamber patient tolerated well 

## 2013-11-20 NOTE — Discharge Instructions (Signed)
Asthma Asthma is a recurring condition in which the airways tighten and narrow. Asthma can make it difficult to breathe. It can cause coughing, wheezing, and shortness of breath. Asthma episodes, also called asthma attacks, range from minor to life-threatening. Asthma cannot be cured, but medicines and lifestyle changes can help control it. CAUSES Asthma is believed to be caused by inherited (genetic) and environmental factors, but its exact cause is unknown. Asthma may be triggered by allergens, lung infections, or irritants in the air. Asthma triggers are different for each person. Common triggers include:   Animal dander.  Dust mites.  Cockroaches.  Pollen from trees or grass.  Mold.  Smoke.  Air pollutants such as dust, household cleaners, hair sprays, aerosol sprays, paint fumes, strong chemicals, or strong odors.  Cold air, weather changes, and winds (which increase molds and pollens in the air).  Strong emotional expressions such as crying or laughing hard.  Stress.  Certain medicines (such as aspirin) or types of drugs (such as beta-blockers).  Sulfites in foods and drinks. Foods and drinks that may contain sulfites include dried fruit, potato chips, and sparkling grape juice.  Infections or inflammatory conditions such as the flu, a cold, or an inflammation of the nasal membranes (rhinitis).  Gastroesophageal reflux disease (GERD).  Exercise or strenuous activity. SYMPTOMS Symptoms may occur immediately after asthma is triggered or many hours later. Symptoms include:  Wheezing.  Excessive nighttime or early morning coughing.  Frequent or severe coughing with a common cold.  Chest tightness.  Shortness of breath. DIAGNOSIS  The diagnosis of asthma is made by a review of your medical history and a physical exam. Tests may also be performed. These may include:  Lung function studies. These tests show how much air you breathe in and out.  Allergy  tests.  Imaging tests such as X-rays. TREATMENT  Asthma cannot be cured, but it can usually be controlled. Treatment involves identifying and avoiding your asthma triggers. It also involves medicines. There are 2 classes of medicine used for asthma treatment:   Controller medicines. These prevent asthma symptoms from occurring. They are usually taken every day.  Reliever or rescue medicines. These quickly relieve asthma symptoms. They are used as needed and provide short-term relief. Your health care provider will help you create an asthma action plan. An asthma action plan is a written plan for managing and treating your asthma attacks. It includes a list of your asthma triggers and how they may be avoided. It also includes information on when medicines should be taken and when their dosage should be changed. An action plan may also involve the use of a device called a peak flow meter. A peak flow meter measures how well the lungs are working. It helps you monitor your condition. HOME CARE INSTRUCTIONS   Take medicines only as directed by your health care provider. Speak with your health care provider if you have questions about how or when to take the medicines.  Use a peak flow meter as directed by your health care provider. Record and keep track of readings.  Understand and use the action plan to help minimize or stop an asthma attack without needing to seek medical care.  Control your home environment in the following ways to help prevent asthma attacks:  Do not smoke. Avoid being exposed to secondhand smoke.  Change your heating and air conditioning filter regularly.  Limit your use of fireplaces and wood stoves.  Get rid of pests (such as roaches and   mice) and their droppings.  Throw away plants if you see mold on them.  Clean your floors and dust regularly. Use unscented cleaning products.  Try to have someone else vacuum for you regularly. Stay out of rooms while they are  being vacuumed and for a short while afterward. If you vacuum, use a dust mask from a hardware store, a double-layered or microfilter vacuum cleaner bag, or a vacuum cleaner with a HEPA filter.  Replace carpet with wood, tile, or vinyl flooring. Carpet can trap dander and dust.  Use allergy-proof pillows, mattress covers, and box spring covers.  Wash bed sheets and blankets every week in hot water and dry them in a dryer.  Use blankets that are made of polyester or cotton.  Clean bathrooms and kitchens with bleach. If possible, have someone repaint the walls in these rooms with mold-resistant paint. Keep out of the rooms that are being cleaned and painted.  Wash hands frequently. SEEK MEDICAL CARE IF:   You have wheezing, shortness of breath, or a cough even if taking medicine to prevent attacks.  The colored mucus you cough up (sputum) is thicker than usual.  Your sputum changes from clear or white to yellow, green, gray, or bloody.  You have any problems that may be related to the medicines you are taking (such as a rash, itching, swelling, or trouble breathing).  You are using a reliever medicine more than 2-3 times per week.  Your peak flow is still at 50-79% of your personal best after following your action plan for 1 hour.  You have a fever. SEEK IMMEDIATE MEDICAL CARE IF:   You seem to be getting worse and are unresponsive to treatment during an asthma attack.  You are short of breath even at rest.  You get short of breath when doing very little physical activity.  You have difficulty eating, drinking, or talking due to asthma symptoms.  You develop chest pain.  You develop a fast heartbeat.  You have a bluish color to your lips or fingernails.  You are light-headed, dizzy, or faint.  Your peak flow is less than 50% of your personal best. MAKE SURE YOU:   Understand these instructions.  Will watch your condition.  Will get help right away if you are not  doing well or get worse. Document Released: 01/31/2005 Document Revised: 06/17/2013 Document Reviewed: 08/30/2012 [employer] Patient Information 2015 [employer]. This information is not intended to replace advice given to you by your health care provider. Make sure you discuss any questions you have with your health care provider.   Asthma Attack Prevention Although there is no way to prevent asthma from starting, you can take steps to control the disease and reduce its symptoms. Learn about your asthma and how to control it. Take an active role to control your asthma by working with your health care provider to create and follow an asthma action plan. An asthma action plan guides you in:  Taking your medicines properly.  Avoiding things that set off your asthma or make your asthma worse (asthma triggers).  Tracking your level of asthma control.  Responding to worsening asthma.  Seeking emergency care when needed. To track your asthma, keep records of your symptoms, check your peak flow number using a handheld device that shows how well air moves out of your lungs (peak flow meter), and get regular asthma checkups.  WHAT ARE SOME WAYS TO PREVENT AN ASTHMA ATTACK?  Take medicines as directed by your   health care provider.  Keep track of your asthma symptoms and level of control.  With your health care provider, write a detailed plan for taking medicines and managing an asthma attack. Then be sure to follow your action plan. Asthma is an ongoing condition that needs regular monitoring and treatment.  Identify and avoid asthma triggers. Many outdoor allergens and irritants (such as pollen, mold, cold air, and air pollution) can trigger asthma attacks. Find out what your asthma triggers are and take steps to avoid them.  Monitor your breathing. Learn to recognize warning signs of an attack, such as coughing, wheezing, or shortness of breath. Your lung function may decrease before you notice  any signs or symptoms, so regularly measure and record your peak airflow with a home peak flow meter.  Identify and treat attacks early. If you act quickly, you are less likely to have a severe attack. You will also need less medicine to control your symptoms. When your peak flow measurements decrease and alert you to an upcoming attack, take your medicine as instructed and immediately stop any activity that may have triggered the attack. If your symptoms do not improve, get medical help.  Pay attention to increasing quick-relief inhaler use. If you find yourself relying on your quick-relief inhaler, your asthma is not under control. See your health care provider about adjusting your treatment. WHAT CAN MAKE MY SYMPTOMS WORSE? A number of common things can set off or make your asthma symptoms worse and cause temporary increased inflammation of your airways. Keep track of your asthma symptoms for several weeks, detailing all the environmental and emotional factors that are linked with your asthma. When you have an asthma attack, go back to your asthma diary to see which factor, or combination of factors, might have contributed to it. Once you know what these factors are, you can take steps to control many of them. If you have allergies and asthma, it is important to take asthma prevention steps at home. Minimizing contact with the substance to which you are allergic will help prevent an asthma attack. Some triggers and ways to avoid these triggers are: Animal Dander:  Some people are allergic to the flakes of skin or dried saliva from animals with fur or feathers.   There is no such thing as a hypoallergenic dog or cat breed. All dogs or cats can cause allergies, even if they don't shed.  Keep these pets out of your home.  If you are not able to keep a pet outdoors, keep the pet out of your bedroom and other sleeping areas at all times, and keep the door closed.  Remove carpets and furniture covered  with cloth from your home. If that is not possible, keep the pet away from fabric-covered furniture and carpets. Dust Mites: Many people with asthma are allergic to dust mites. Dust mites are tiny bugs that are found in every home in mattresses, pillows, carpets, fabric-covered furniture, bedcovers, clothes, stuffed toys, and other fabric-covered items.   Cover your mattress in a special dust-proof cover.  Cover your pillow in a special dust-proof cover, or wash the pillow each week in hot water. Water must be hotter than 130 F (54.4 C) to kill dust mites. Cold or warm water used with detergent and bleach can also be effective.  Wash the sheets and blankets on your bed each week in hot water.  Try not to sleep or lie on cloth-covered cushions.  Call ahead when traveling and ask for a   smoke-free hotel room. Bring your own bedding and pillows in case the hotel only supplies feather pillows and down comforters, which may contain dust mites and cause asthma symptoms.  Remove carpets from your bedroom and those laid on concrete, if you can.  Keep stuffed toys out of the bed, or wash the toys weekly in hot water or cooler water with detergent and bleach. Cockroaches: Many people with asthma are allergic to the droppings and remains of cockroaches.   Keep food and garbage in closed containers. Never leave food out.  Use poison baits, traps, powders, gels, or paste (for example, boric acid).  If a spray is used to kill cockroaches, stay out of the room until the odor goes away. Indoor Mold:  Fix leaky faucets, pipes, or other sources of water that have mold around them.  Clean floors and moldy surfaces with a fungicide or diluted bleach.  Avoid using humidifiers, vaporizers, or swamp coolers. These can spread molds through the air. Pollen and Outdoor Mold:  When pollen or mold spore counts are high, try to keep your windows closed.  Stay indoors with windows closed from late morning to  afternoon. Pollen and some mold spore counts are highest at that time.  Ask your health care provider whether you need to take anti-inflammatory medicine or increase your dose of the medicine before your allergy season starts. Other Irritants to Avoid:  Tobacco smoke is an irritant. If you smoke, ask your health care provider how you can quit. Ask family members to quit smoking, too. Do not allow smoking in your home or car.  If possible, do not use a wood-burning stove, kerosene heater, or fireplace. Minimize exposure to all sources of smoke, including incense, candles, fires, and fireworks.  Try to stay away from strong odors and sprays, such as perfume, talcum powder, hair spray, and paints.  Decrease humidity in your home and use an indoor air cleaning device. Reduce indoor humidity to below 60%. Dehumidifiers or central air conditioners can do this.  Decrease house dust exposure by changing furnace and air cooler filters frequently.  Try to have someone else vacuum for you once or twice a week. Stay out of rooms while they are being vacuumed and for a short while afterward.  If you vacuum, use a dust mask from a hardware store, a double-layered or microfilter vacuum cleaner bag, or a vacuum cleaner with a HEPA filter.  Sulfites in foods and beverages can be irritants. Do not drink beer or wine or eat dried fruit, processed potatoes, or shrimp if they cause asthma symptoms.  Cold air can trigger an asthma attack. Cover your nose and mouth with a scarf on cold or windy days.  Several health conditions can make asthma more difficult to manage, including a runny nose, sinus infections, reflux disease, psychological stress, and sleep apnea. Work with your health care provider to manage these conditions.  Avoid close contact with people who have a respiratory infection such as a cold or the flu, since your asthma symptoms may get worse if you catch the infection. Wash your hands thoroughly  after touching items that may have been handled by people with a respiratory infection.  Get a flu shot every year to protect against the flu virus, which often makes asthma worse for days or weeks. Also get a pneumonia shot if you have not previously had one. Unlike the flu shot, the pneumonia shot does not need to be given yearly. Medicines:  Talk to your   health care provider about whether it is safe for you to take aspirin or non-steroidal anti-inflammatory medicines (NSAIDs). In a small number of people with asthma, aspirin and NSAIDs can cause asthma attacks. These medicines must be avoided by people who have known aspirin-sensitive asthma. It is important that people with aspirin-sensitive asthma read labels of all over-the-counter medicines used to treat pain, colds, coughs, and fever.  Beta-blockers and ACE inhibitors are other medicines you should discuss with your health care provider. HOW CAN I FIND OUT WHAT I AM ALLERGIC TO? Ask your asthma health care provider about allergy skin testing or blood testing (the RAST test) to identify the allergens to which you are sensitive. If you are found to have allergies, the most important thing to do is to try to avoid exposure to any allergens that you are sensitive to as much as possible. Other treatments for allergies, such as medicines and allergy shots (immunotherapy) are available.  CAN I EXERCISE? Follow your health care provider's advice regarding asthma treatment before exercising. It is important to maintain a regular exercise program, but vigorous exercise or exercise in cold, humid, or dry environments can cause asthma attacks, especially for those people who have exercise-induced asthma. Document Released: 01/19/2009 Document Revised: 02/05/2013 Document Reviewed: 08/08/2012 [employer] Patient Information 2015 [employer]. This information is not intended to replace advice given to you by your health care provider. Make sure you discuss  any questions you have with your health care provider.  

## 2013-11-20 NOTE — ED Provider Notes (Signed)
CSN: [id number]     Arrival date & time 11/20/13  0117 History   First MD Initiated Contact with Patient 11/20/13 0302     Chief Complaint  Patient presents with  . Cough     (Consider location/radiation/quality/duration/timing/severity/associated sxs/prior Treatment) Patient is a 43 y.o. male presenting with cough.  Cough Cough characteristics:  Non-productive Severity:  Moderate Onset quality:  Gradual Duration:  16 hours Timing:  Constant Progression:  Worsening Chronicity:  Recurrent Context comment:  Hx of asthma, out of inhaler, this season seems to flare up pt's symptoms.  Relieved by:  Nothing Associated symptoms: chest pain (tight) and shortness of breath   Associated symptoms: no fever     Past Medical History  Diagnosis Date  . Asthma    History reviewed. No pertinent past surgical history. Family History  Problem Relation Age of Onset  . Hypertension Mother   . Diabetes Mother    History  Substance Use Topics  . Smoking status: Current Every Day Smoker    Types: Cigars  . Smokeless tobacco: Not on file  . Alcohol Use: Yes     Comment: occassional    Review of Systems  Constitutional: Negative for fever.  Respiratory: Positive for cough and shortness of breath.   Cardiovascular: Positive for chest pain (tight).  All other systems reviewed and are negative.     Allergies  Shellfish allergy; Catfish; and Other  Home Medications   Prior to Admission medications   Medication Sig Start Date End Date Taking? Authorizing Provider  albuterol (PROVENTIL HFA;VENTOLIN HFA) 108 (90 BASE) MCG/ACT inhaler Inhale 1-2 puffs into the lungs every 6 (six) hours as needed for wheezing. 06/18/12   Jacob Mode-Adam Bonk, MD  loratadine (CLARITIN) 10 MG tablet Take 1 tablet (10 mg total) by mouth daily. 06/18/12   Jacob Menard-Adam Bonk, MD  predniSONE (DELTASONE) 50 MG tablet Take 1 tablet (50 mg total) by mouth daily. 06/18/12   Jacob Drolet-Adam Bonk, MD   BP 147/100  Pulse 66  Temp(Src)  98.1 F (36.7 C) (Oral)  Resp 24  Ht 5\' 10"  (1.778 m)  Wt 230 lb (104.327 kg)  BMI 33.00 kg/m2  SpO2 98% Physical Exam  Nursing note and vitals reviewed. Constitutional: He is oriented to person, place, and time. He appears well-developed and well-nourished. No distress.  HENT:  Head: Normocephalic and atraumatic.  Mouth/Throat: Oropharynx is clear and moist.  Eyes: Conjunctivae are normal. Pupils are equal, round, and reactive to light. No scleral icterus.  Neck: Neck supple.  Cardiovascular: Normal rate, regular rhythm, normal heart sounds and intact distal pulses.   No murmur heard. Pulmonary/Chest: Effort normal and breath sounds normal. No stridor. No respiratory distress. He has no wheezes. He has no rales.  Abdominal: Soft. He exhibits no distension. There is no tenderness.  Musculoskeletal: Normal range of motion. He exhibits no edema.  Neurological: He is alert and oriented to person, place, and time.  Skin: Skin is warm and dry. No rash noted.  Psychiatric: He has a normal mood and affect. His behavior is normal.    ED Course  Procedures (including critical care time) Labs Review Labs Reviewed - No data to display  Imaging Review No results found.   EKG Interpretation None      MDM   Final diagnoses:  Acute asthma exacerbation, unspecified asthma severity    43 yo male with hx of asthma presenting with cough and SOB.  Neb treatment initiated by triage protocol resolved pt's symptoms.  On my  exam, well appearing, not hypoxic, no respiratory distress, no wheezing.  Will treat as mild asthma exacerbation.  He will follow up closely with PCP.      Warnell Forester, MD 11/20/13 0400

## 2013-12-28 ENCOUNTER — Emergency Department (HOSPITAL_COMMUNITY): Admission: EM | Admit: 2013-12-28 | Discharge: 2013-12-28 | Discharge status: Absent without leave | Payer: 59
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
## 2013-12-28 NOTE — ED Notes (Signed)
Bed: WTR5 Expected date:  Expected time:  Means of arrival:  Comments: 

## 2014-02-17 ENCOUNTER — Emergency Department (HOSPITAL_BASED_OUTPATIENT_CLINIC_OR_DEPARTMENT_OTHER): Payer: Self-pay

## 2014-02-17 ENCOUNTER — Encounter (HOSPITAL_BASED_OUTPATIENT_CLINIC_OR_DEPARTMENT_OTHER): Payer: Self-pay | Admitting: *Deleted

## 2014-02-17 DIAGNOSIS — J45901 Unspecified asthma with (acute) exacerbation: Secondary | ICD-10-CM | POA: Insufficient documentation

## 2014-02-17 DIAGNOSIS — Z79899 Other long term (current) drug therapy: Secondary | ICD-10-CM | POA: Insufficient documentation

## 2014-02-17 DIAGNOSIS — Z720 Tobacco use: Secondary | ICD-10-CM | POA: Insufficient documentation

## 2014-02-17 NOTE — ED Notes (Signed)
Pt reports shortness of breath that started today.  Hx of asthma.  Reports that his inhaler is not helping.

## 2014-02-18 ENCOUNTER — Emergency Department (HOSPITAL_BASED_OUTPATIENT_CLINIC_OR_DEPARTMENT_OTHER)
Admission: EM | Admit: 2014-02-18 | Discharge: 2014-02-18 | Disposition: A | Discharge status: Home / Self Care | Payer: Self-pay | Attending: Emergency Medicine | Admitting: Emergency Medicine

## 2014-02-18 DIAGNOSIS — R0602 Shortness of breath: Secondary | ICD-10-CM

## 2014-02-18 DIAGNOSIS — J45901 Unspecified asthma with (acute) exacerbation: Secondary | ICD-10-CM

## 2014-02-18 MED ORDER — FLUTICASONE-SALMETEROL 250-50 MCG/DOSE IN AEPB: 1.0000 | INHALATION_SPRAY | Freq: Two times a day (BID) | RESPIRATORY_TRACT | Status: DC

## 2014-02-18 MED ORDER — IPRATROPIUM BROMIDE 0.02 % IN SOLN
0.5000 mg | Freq: Once | RESPIRATORY_TRACT | Status: AC
  Administered 2014-02-18: 0.5 mg via RESPIRATORY_TRACT
  Filled 2014-02-18: qty 2.5

## 2014-02-18 MED ORDER — ALBUTEROL SULFATE (2.5 MG/3ML) 0.083% IN NEBU
5.0000 mg | INHALATION_SOLUTION | Freq: Once | RESPIRATORY_TRACT | Status: AC
  Administered 2014-02-18: 5 mg via RESPIRATORY_TRACT
  Filled 2014-02-18: qty 6

## 2014-02-18 MED ORDER — DEXAMETHASONE SODIUM PHOSPHATE 10 MG/ML IJ SOLN
10.0000 mg | Freq: Once | INTRAMUSCULAR | Status: DC
  Filled 2014-02-18: qty 1

## 2014-02-18 MED ORDER — ALBUTEROL SULFATE HFA 108 (90 BASE) MCG/ACT IN AERS
2.0000 | INHALATION_SPRAY | RESPIRATORY_TRACT | Status: DC | PRN
  Administered 2014-02-18: 2 via RESPIRATORY_TRACT
  Filled 2014-02-18: qty 6.7

## 2014-02-18 NOTE — ED Notes (Signed)
States has been wheezing today,  Had cleared up but now is coming back  Pt in no distress

## 2014-02-18 NOTE — Discharge Instructions (Signed)

## 2014-02-18 NOTE — ED Provider Notes (Signed)
CSN: [id number]     Arrival date & time 02/17/14  2235 History  This chart was scribed for Hanley SeamenJohn L Elan Mcelvain, MD by [place] Peace, ED Scribe. The patient was seen in [facility]09/[facility]09. The patient's care was started at 12:56 AM.      Chief Complaint  Patient presents with  . Shortness of Breath    Patient is a 44 y.o. male presenting with shortness of breath. The history is provided by the patient. No language interpreter was used.  Shortness of Breath Onset quality:  Sudden Duration:  1 day Progression:  Unchanged Chronicity:  New Associated symptoms: wheezing   Associated symptoms: no vomiting     HPI Comments: Marcina MillardMichael L Dingus is a 44 y.o. male who presents to the Emergency Department complaining of SOB onset yesterday with wheezing.  Pt reports transient relief with inhaler but adds he thinks he has exhausted his inhaler. No complaints of nausea, vomiting, or diarrhea. History of asthma. Symptoms have been moderate to severe at times.  Past Medical History  Diagnosis Date  . Asthma    History reviewed. No pertinent past surgical history. Family History  Problem Relation Age of Onset  . Hypertension Mother   . Diabetes Mother    History  Substance Use Topics  . Smoking status: Current Every Day Smoker    Types: Cigars  . Smokeless tobacco: Not on file  . Alcohol Use: Yes     Comment: occassional    Review of Systems  Respiratory: Positive for shortness of breath and wheezing.   Gastrointestinal: Negative for nausea, vomiting and diarrhea.  All other systems reviewed and are negative.   Allergies  Shellfish allergy; Catfish; and Other  Home Medications   Prior to Admission medications   Medication Sig Start Date End Date Taking? Authorizing Provider  albuterol (PROVENTIL HFA;VENTOLIN HFA) 108 (90 BASE) MCG/ACT inhaler Inhale 1-2 puffs into the lungs every 6 (six) hours as needed for wheezing. 06/18/12   Sarahann Horrell-Adam Bonk, MD  albuterol (PROVENTIL HFA;VENTOLIN HFA) 108 (90 BASE)  MCG/ACT inhaler Inhale 1-2 puffs into the lungs every 4 (four) hours as needed for wheezing or shortness of breath. 11/20/13   Candyce ChurnJohn David Wofford III, MD  loratadine (CLARITIN) 10 MG tablet Take 1 tablet (10 mg total) by mouth daily. 06/18/12   Shaleigh Laubscher-Adam Bonk, MD   BP 126/98 mmHg  Pulse 82  Temp(Src) 97.7 F (36.5 C) (Oral)  Resp 18  Ht 5\' 10"  (1.778 m)  Wt 235 lb (106.595 kg)  BMI 33.72 kg/m2  SpO2 96%  Physical Exam  General: Well-developed, well-nourished male in no acute distress; appearance consistent with age of record HENT: normocephalic; atraumatic. Pharynx normal.  Eyes: pupils equal, round and reactive to light; extraocular muscles intact Neck: supple Heart: regular rate and rhythm; no murmurs, rubs or gallops Lungs: clear to auscultation bilaterally; diminished air movement bilaterally; faint expiratory wheezes anteriorly Abdomen: soft; nondistended; nontender; no masses or hepatosplenomegaly; bowel sounds present Extremities: No deformity; full range of motion; pulses normal Neurologic: Awake, alert and oriented; motor function intact in all extremities and symmetric; no facial droop Skin: Warm and dry Psychiatric: Normal mood and affect    ED Course  Procedures (including critical care time)  12:59 AM- Treatment plan was discussed with patient who verbalizes understanding and agrees.   MDM  Nursing notes and vitals signs, including pulse oximetry, reviewed.  Summary of this visit's results, reviewed by myself:  Imaging Studies: Dg Chest 2 View  02/17/2014   CLINICAL DATA:  Wheezing since this morning. Shortness of breath. History of asthma.  EXAM: CHEST  2 VIEW  COMPARISON:  03/01/2012  FINDINGS: The heart size and mediastinal contours are within normal limits. Both lungs are clear. The visualized skeletal structures are unremarkable.  IMPRESSION: No active cardiopulmonary disease.   Electronically Signed   By: Burman Nieves M.D.   On: 02/17/2014 23:09   2:09  AM Patient feels better after albuterol and Atrovent neb treatment. He is requesting a refill of his Advair Diskus.   I personally performed the services described in this documentation, which was scribed in my presence. The recorded information has been reviewed and is accurate.  Hanley Seamen, MD 02/18/14 [id number]

## 2014-02-18 NOTE — ED Notes (Signed)
Pt refused decadron  States has bad side effect

## 2014-05-14 ENCOUNTER — Encounter (HOSPITAL_BASED_OUTPATIENT_CLINIC_OR_DEPARTMENT_OTHER): Payer: Self-pay | Admitting: Emergency Medicine

## 2014-05-14 ENCOUNTER — Emergency Department (HOSPITAL_BASED_OUTPATIENT_CLINIC_OR_DEPARTMENT_OTHER)
Admission: EM | Admit: 2014-05-14 | Discharge: 2014-05-14 | Disposition: A | Discharge status: Home / Self Care | Payer: Self-pay | Attending: Emergency Medicine | Admitting: Emergency Medicine

## 2014-05-14 ENCOUNTER — Emergency Department (HOSPITAL_BASED_OUTPATIENT_CLINIC_OR_DEPARTMENT_OTHER): Payer: Self-pay

## 2014-05-14 DIAGNOSIS — J45901 Unspecified asthma with (acute) exacerbation: Secondary | ICD-10-CM | POA: Insufficient documentation

## 2014-05-14 DIAGNOSIS — Z79899 Other long term (current) drug therapy: Secondary | ICD-10-CM | POA: Insufficient documentation

## 2014-05-14 DIAGNOSIS — Z7951 Long term (current) use of inhaled steroids: Secondary | ICD-10-CM | POA: Insufficient documentation

## 2014-05-14 DIAGNOSIS — Z87891 Personal history of nicotine dependence: Secondary | ICD-10-CM | POA: Insufficient documentation

## 2014-05-14 MED ORDER — ALBUTEROL SULFATE HFA 108 (90 BASE) MCG/ACT IN AERS
2.0000 | INHALATION_SPRAY | RESPIRATORY_TRACT | Status: DC | PRN
  Administered 2014-05-14: 2 via RESPIRATORY_TRACT
  Filled 2014-05-14: qty 6.7

## 2014-05-14 MED ORDER — PREDNISONE 20 MG PO TABS: 60.0000 mg | ORAL_TABLET | Freq: Every day | ORAL | Status: DC

## 2014-05-14 MED ORDER — FLUTICASONE-SALMETEROL 250-50 MCG/DOSE IN AEPB: 1.0000 | INHALATION_SPRAY | Freq: Two times a day (BID) | RESPIRATORY_TRACT | Status: DC

## 2014-05-14 MED ORDER — PREDNISONE 50 MG PO TABS
60.0000 mg | ORAL_TABLET | Freq: Once | ORAL | Status: AC
  Administered 2014-05-14: 60 mg via ORAL
  Filled 2014-05-14 (×2): qty 1

## 2014-05-14 MED ORDER — ALBUTEROL SULFATE HFA 108 (90 BASE) MCG/ACT IN AERS: 1.0000 | INHALATION_SPRAY | RESPIRATORY_TRACT | Status: DC | PRN

## 2014-05-14 MED ORDER — ALBUTEROL SULFATE (2.5 MG/3ML) 0.083% IN NEBU
5.0000 mg | INHALATION_SOLUTION | Freq: Once | RESPIRATORY_TRACT | Status: AC
  Administered 2014-05-14: 5 mg via RESPIRATORY_TRACT
  Filled 2014-05-14: qty 6

## 2014-05-14 NOTE — Discharge Instructions (Signed)
Asthma °Asthma is a recurring condition in which the airways tighten and narrow. Asthma can make it difficult to breathe. It can cause coughing, wheezing, and shortness of breath. Asthma episodes, also called asthma attacks, range from minor to life-threatening. Asthma cannot be cured, but medicines and lifestyle changes can help control it. °CAUSES °Asthma is believed to be caused by inherited (genetic) and environmental factors, but its exact cause is unknown. Asthma may be triggered by allergens, lung infections, or irritants in the air. Asthma triggers are different for each person. Common triggers include:  °· Animal dander. °· Dust mites. °· Cockroaches. °· Pollen from trees or grass. °· Mold. °· Smoke. °· Air pollutants such as dust, household cleaners, hair sprays, aerosol sprays, paint fumes, strong chemicals, or strong odors. °· Cold air, weather changes, and winds (which increase molds and pollens in the air). °· Strong emotional expressions such as crying or laughing hard. °· Stress. °· Certain medicines (such as aspirin) or types of drugs (such as beta-blockers). °· Sulfites in foods and drinks. Foods and drinks that may contain sulfites include dried fruit, potato chips, and sparkling grape juice. °· Infections or inflammatory conditions such as the flu, a cold, or an inflammation of the nasal membranes (rhinitis). °· Gastroesophageal reflux disease (GERD). °· Exercise or strenuous activity. °SYMPTOMS °Symptoms may occur immediately after asthma is triggered or many hours later. Symptoms include: °· Wheezing. °· Excessive nighttime or early morning coughing. °· Frequent or severe coughing with a common cold. °· Chest tightness. °· Shortness of breath. °DIAGNOSIS  °The diagnosis of asthma is made by a review of your medical history and a physical exam. Tests may also be performed. These may include: °· Lung function studies. These tests show how much air you breathe in and out. °· Allergy  tests. °· Imaging tests such as X-rays. °TREATMENT  °Asthma cannot be cured, but it can usually be controlled. Treatment involves identifying and avoiding your asthma triggers. It also involves medicines. There are 2 classes of medicine used for asthma treatment:  °· Controller medicines. These prevent asthma symptoms from occurring. They are usually taken every day. °· Reliever or rescue medicines. These quickly relieve asthma symptoms. They are used as needed and provide short-term relief. °Your health care provider will help you create an asthma action plan. An asthma action plan is a written plan for managing and treating your asthma attacks. It includes a list of your asthma triggers and how they may be avoided. It also includes information on when medicines should be taken and when their dosage should be changed. An action plan may also involve the use of a device called a peak flow meter. A peak flow meter measures how well the lungs are working. It helps you monitor your condition. °HOME CARE INSTRUCTIONS  °· Take medicines only as directed by your health care provider. Speak with your health care provider if you have questions about how or when to take the medicines. °· Use a peak flow meter as directed by your health care provider. Record and keep track of readings. °· Understand and use the action plan to help minimize or stop an asthma attack without needing to seek medical care. °· Control your home environment in the following ways to help prevent asthma attacks: °¨ Do not smoke. Avoid being exposed to secondhand smoke. °¨ Change your heating and air conditioning filter regularly. °¨ Limit your use of fireplaces and wood stoves. °¨ Get rid of pests (such as roaches and   mice) and their droppings. °¨ Throw away plants if you see mold on them. °¨ Clean your floors and dust regularly. Use unscented cleaning products. °¨ Try to have someone else vacuum for you regularly. Stay out of rooms while they are  being vacuumed and for a short while afterward. If you vacuum, use a dust mask from a hardware store, a double-layered or microfilter vacuum cleaner bag, or a vacuum cleaner with a HEPA filter. °¨ Replace carpet with wood, tile, or vinyl flooring. Carpet can trap dander and dust. °¨ Use allergy-proof pillows, mattress covers, and box spring covers. °¨ Wash bed sheets and blankets every week in hot water and dry them in a dryer. °¨ Use blankets that are made of polyester or cotton. °¨ Clean bathrooms and kitchens with bleach. If possible, have someone repaint the walls in these rooms with mold-resistant paint. Keep out of the rooms that are being cleaned and painted. °¨ Wash hands frequently. °SEEK MEDICAL CARE IF:  °· You have wheezing, shortness of breath, or a cough even if taking medicine to prevent attacks. °· The colored mucus you cough up (sputum) is thicker than usual. °· Your sputum changes from clear or white to yellow, green, gray, or bloody. °· You have any problems that may be related to the medicines you are taking (such as a rash, itching, swelling, or trouble breathing). °· You are using a reliever medicine more than 2-3 times per week. °· Your peak flow is still at 50-79% of your personal best after following your action plan for 1 hour. °· You have a fever. °SEEK IMMEDIATE MEDICAL CARE IF:  °· You seem to be getting worse and are unresponsive to treatment during an asthma attack. °· You are short of breath even at rest. °· You get short of breath when doing very little physical activity. °· You have difficulty eating, drinking, or talking due to asthma symptoms. °· You develop chest pain. °· You develop a fast heartbeat. °· You have a bluish color to your lips or fingernails. °· You are light-headed, dizzy, or faint. °· Your peak flow is less than 50% of your personal best. °MAKE SURE YOU:  °· Understand these instructions. °· Will watch your condition. °· Will get help right away if you are not  doing well or get worse. °Document Released: 01/31/2005 Document Revised: 06/17/2013 Document Reviewed: 08/30/2012 °[employer]® Patient Information ©2015 [employer]. This information is not intended to replace advice given to you by your health care provider. Make sure you discuss any questions you have with your health care provider. ° °Prednisone tablets °What is this medicine? °PREDNISONE (PRED ni sone) is a corticosteroid. It is commonly used to treat inflammation of the skin, joints, lungs, and other organs. Common conditions treated include asthma, allergies, and arthritis. It is also used for other conditions, such as blood disorders and diseases of the adrenal glands. °This medicine may be used for other purposes; ask your health care provider or pharmacist if you have questions. °COMMON BRAND NAME(S): Deltasone, Predone, Sterapred, Sterapred DS °What should I tell my health care provider before I take this medicine? °They need to know if you have any of these conditions: °-Cushing's syndrome °-diabetes °-glaucoma °-heart disease °-high blood pressure °-infection (especially a virus infection such as chickenpox, cold sores, or herpes) °-kidney disease °-liver disease °-mental illness °-myasthenia gravis °-osteoporosis °-seizures °-stomach or intestine problems °-thyroid disease °-an unusual or allergic reaction to lactose, prednisone, other medicines, foods, dyes, or preservatives °-pregnant   or trying to get pregnant -breast-feeding How should I use this medicine? Take this medicine by mouth with a glass of water. Follow the directions on the prescription label. Take this medicine with food. If you are taking this medicine once a day, take it in the morning. Do not take more medicine than you are told to take. Do not suddenly stop taking your medicine because you may develop a severe reaction. Your doctor will tell you how much medicine to take. If your doctor wants you to stop the medicine, the dose may  be slowly lowered over time to avoid any side effects. Talk to your pediatrician regarding the use of this medicine in children. Special care may be needed. Overdosage: If you think you have taken too much of this medicine contact a poison control center or emergency room at once. NOTE: This medicine is only for you. Do not share this medicine with others. What if I miss a dose? If you miss a dose, take it as soon as you can. If it is almost time for your next dose, talk to your doctor or health care professional. You may need to miss a dose or take an extra dose. Do not take double or extra doses without advice. What may interact with this medicine? Do not take this medicine with any of the following medications: -metyrapone -mifepristone This medicine may also interact with the following medications: -aminoglutethimide -amphotericin B -aspirin and aspirin-like medicines -barbiturates -certain medicines for diabetes, like glipizide or glyburide -cholestyramine -cholinesterase inhibitors -cyclosporine -digoxin -diuretics -ephedrine -male hormones, like estrogens and birth control pills -isoniazid -ketoconazole -NSAIDS, medicines for pain and inflammation, like ibuprofen or naproxen -phenytoin -rifampin -toxoids -vaccines -warfarin This list may not describe all possible interactions. Give your health care provider a list of all the medicines, herbs, non-prescription drugs, or dietary supplements you use. Also tell them if you smoke, drink alcohol, or use illegal drugs. Some items may interact with your medicine. What should I watch for while using this medicine? Visit your doctor or health care professional for regular checks on your progress. If you are taking this medicine over a prolonged period, carry an identification card with your name and address, the type and dose of your medicine, and your doctor's name and address. This medicine may increase your risk of getting an  infection. Tell your doctor or health care professional if you are around anyone with measles or chickenpox, or if you develop sores or blisters that do not heal properly. If you are going to have surgery, tell your doctor or health care professional that you have taken this medicine within the last twelve months. Ask your doctor or health care professional about your diet. You may need to lower the amount of salt you eat. This medicine may affect blood sugar levels. If you have diabetes, check with your doctor or health care professional before you change your diet or the dose of your diabetic medicine. What side effects may I notice from receiving this medicine? Side effects that you should report to your doctor or health care professional as soon as possible: -allergic reactions like skin rash, itching or hives, swelling of the face, lips, or tongue -changes in emotions or moods -changes in vision -depressed mood -eye pain -fever or chills, cough, sore throat, pain or difficulty passing urine -increased thirst -swelling of ankles, feet Side effects that usually do not require medical attention (report to your doctor or health care professional if they continue or are  bothersome): -confusion, excitement, restlessness -headache -nausea, vomiting -skin problems, acne, thin and shiny skin -trouble sleeping -weight gain This list may not describe all possible side effects. Call your doctor for medical advice about side effects. You may report side effects to FDA at 1-800-FDA-1088. Where should I keep my medicine? Keep out of the reach of children. Store at room temperature between 15 and 30 degrees C (59 and 86 degrees F). Protect from light. Keep container tightly closed. Throw away any unused medicine after the expiration date. NOTE: This sheet is a summary. It may not cover all possible information. If you have questions about this medicine, talk to your doctor, pharmacist, or health care  provider.  2015, [employer]. (2010-09-16 10:57:14)  Albuterol inhalation aerosol What is this medicine? ALBUTEROL (al [place]) is a bronchodilator. It helps open up the airways in your lungs to make it easier to breathe. This medicine is used to treat and to prevent bronchospasm. This medicine may be used for other purposes; ask your health care provider or pharmacist if you have questions. COMMON BRAND NAME(S): Proair HFA, Proventil, Proventil HFA, Respirol, Ventolin, Ventolin HFA What should I tell my health care provider before I take this medicine? They need to know if you have any of the following conditions: -diabetes -heart disease or irregular heartbeat -high blood pressure -pheochromocytoma -seizures -thyroid disease -an unusual or allergic reaction to albuterol, levalbuterol, sulfites, other medicines, foods, dyes, or preservatives -pregnant or trying to get pregnant -breast-feeding How should I use this medicine? This medicine is for inhalation through the mouth. Follow the directions on your prescription label. Take your medicine at regular intervals. Do not use more often than directed. Make sure that you are using your inhaler correctly. Ask you doctor or health care provider if you have any questions. Talk to your pediatrician regarding the use of this medicine in children. Special care may be needed. Overdosage: If you think you have taken too much of this medicine contact a poison control center or emergency room at once. NOTE: This medicine is only for you. Do not share this medicine with others. What if I miss a dose? If you miss a dose, use it as soon as you can. If it is almost time for your next dose, use only that dose. Do not use double or extra doses. What may interact with this medicine? -anti-infectives like chloroquine and pentamidine -caffeine -cisapride -diuretics -medicines for colds -medicines for depression or for emotional or  psychotic conditions -medicines for weight loss including some herbal products -methadone -some antibiotics like clarithromycin, erythromycin, levofloxacin, and linezolid -some heart medicines -steroid hormones like dexamethasone, cortisone, hydrocortisone -theophylline -thyroid hormones This list may not describe all possible interactions. Give your health care provider a list of all the medicines, herbs, non-prescription drugs, or dietary supplements you use. Also tell them if you smoke, drink alcohol, or use illegal drugs. Some items may interact with your medicine. What should I watch for while using this medicine? Tell your doctor or health care professional if your symptoms do not improve. Do not use extra albuterol. If your asthma or bronchitis gets worse while you are using this medicine, call your doctor right away. If your mouth gets dry try chewing sugarless gum or sucking hard candy. Drink water as directed. What side effects may I notice from receiving this medicine? Side effects that you should report to your doctor or health care professional as soon as possible: -allergic reactions like skin rash, itching  or hives, swelling of the face, lips, or tongue -breathing problems -chest pain -feeling faint or lightheaded, falls -high blood pressure -irregular heartbeat -fever -muscle cramps or weakness -pain, tingling, numbness in the hands or feet -vomiting Side effects that usually do not require medical attention (report to your doctor or health care professional if they continue or are bothersome): -cough -difficulty sleeping -headache -nervousness or trembling -stomach upset -stuffy or runny nose -throat irritation -unusual taste This list may not describe all possible side effects. Call your doctor for medical advice about side effects. You may report side effects to FDA at 1-800-FDA-1088. Where should I keep my medicine? Keep out of the reach of children. Store at  room temperature between 15 and 30 degrees C (59 and 86 degrees F). The contents are under pressure and may burst when exposed to heat or flame. Do not freeze. This medicine does not work as well if it is too cold. Throw away any unused medicine after the expiration date. Inhalers need to be thrown away after the labeled number of puffs have been used or by the expiration date; whichever comes first. Ventolin HFA should be thrown away 12 months after removing from foil pouch. Check the instructions that come with your medicine. NOTE: This sheet is a summary. It may not cover all possible information. If you have questions about this medicine, talk to your doctor, pharmacist, or health care provider.  2015, [employer]. (2012-07-19 10:57:17)  Fluticasone; Salmeterol inhalation aerosol What is this medicine? FLUTICASONE; SALMETEROL (floo TIK a sone; sal ME te role) inhalation is a combination of two medicines that decrease inflammation and help to open up the airways of your lungs. It is used to treat asthma. Do NOT use for an acute asthma attack. This medicine may be used for other purposes; ask your health care provider or pharmacist if you have questions. COMMON BRAND NAME(S): Advair HFA What should I tell my health care provider before I take this medicine? They need to know if you have any of these conditions: -bone problems -immune system problems -diabetes -heart disease or irregular heartbeat -high blood pressure -infection -pheochromocytoma -seizures -thyroid disease -worsening asthma -an unusual or allergic reaction to fluticasone; salmeterol, other corticosteroids, other medicines, foods, dyes, or preservatives -pregnant or trying to get pregnant -breast-feeding How should I use this medicine? This medicine is inhaled through the mouth. Follow the directions on the prescription label. Shake well for 5 seconds before each use. After using the inhaler, rinse your mouth  with water. Make sure not to swallow the water. Take your medicine at regular intervals. Do not take your medicine more often than directed. Do not stop taking except on your doctor's advice. Make sure that you are using your inhaler correctly. Ask you doctor or health care provider if you have any questions. A special MedGuide will be given to you by the pharmacist with each prescription and refill. Be sure to read this information carefully each time. Talk to your pediatrician regarding the use of this medicine in children. While this drug may be prescribed for children as young as 34 years of age for selected conditions, precautions do apply. Overdosage: If you think you have taken too much of this medicine contact a poison control center or emergency room at once. NOTE: This medicine is only for you. Do not share this medicine with others. What if I miss a dose? If you miss a dose, use it as soon as you remember. If it is  almost time for your next dose, use only that dose and continue with your regular schedule, spacing doses evenly. Do not use double or extra doses. What may interact with this medicine? Do not take this medicine with any of the following medications: -MAOIs like Carbex, Eldepryl, Marplan, Nardil, and Parnate This medicine may also interact with the following medications: -aminophylline or theophylline -antiviral medicines for HIV or AIDS -diuretics -medicines for colds -medicines for depression or emotional conditions -medicines for fungal infections like ketoconazole and itraconazole -medicines for the heart like metoprolol, propanolol -medicines for weight loss including some herbal products -other medicine for breathing problems -pimozide -some antibiotics like clarithromycin, erythromycin, levofloxacin, linezolid, and telithromycin -vaccines This list may not describe all possible interactions. Give your health care provider a list of all the medicines, herbs,  non-prescription drugs, or dietary supplements you use. Also tell them if you smoke, drink alcohol, or use illegal drugs. Some items may interact with your medicine. What should I watch for while using this medicine? Visit your doctor for regular check ups. Tell your doctor or health care professional if your symptoms do not get better. If your symptoms get worse or if you need your short-acting inhalers more often, call your doctor right away. Do not use this medicine more than every 12 hours. If you have asthma, be aware that using this medicine may increase your risk of dying from asthma-related problems. Talk to your doctor about the risks and benefits of taking this medicine. NEVER use this medicine for an acute asthma attack. This medicine may increase your risk of getting an infection. Tell your doctor or health care professional if you are around anyone with measles or chickenpox, or if you develop sores or blisters that do not heal properly. What side effects may I notice from receiving this medicine? Side effects that you should report to your doctor or health care professional as soon as possible: -allergic reactions like skin rash or hives, swelling of the face, lips, or tongue -changes in vision -chest pain -feeling faint or lightheaded, falls -fever or chills -irregular heartbeat Side effects that usually do not require medical attention (report to your doctor or health care professional if they continue or are bothersome): -coughing, hoarseness, throat irritation -headache -nervousness -stomach problems -stuffy nose -tremors This list may not describe all possible side effects. Call your doctor for medical advice about side effects. You may report side effects to FDA at 1-800-FDA-1088. Where should I keep my medicine? Keep out of the reach of children. Store at room temperature between 15 and 30 degrees C (59 and 86 degrees F). Store inhaler with the mouthpiece down. Keep track  of the number of doses used. Throw away the inhaler after 120 inhalations or after the expiration date, whichever comes first. NOTE: This sheet is a summary. It may not cover all possible information. If you have questions about this medicine, talk to your doctor, pharmacist, or health care provider.  2015, [employer]. (2012-06-06 08:09:33)

## 2014-05-14 NOTE — ED Notes (Signed)
MD at bedside. 

## 2014-05-14 NOTE — ED Notes (Signed)
Patient states that he ran out of his inhaler - has been SOB all day

## 2014-05-14 NOTE — ED Provider Notes (Signed)
CSN: [id number]     Arrival date & time 05/14/14  0058 History   First MD Initiated Contact with Patient 05/14/14 0224     Chief Complaint  Patient presents with  . Shortness of Breath     (Consider location/radiation/quality/duration/timing/severity/associated sxs/prior Treatment) Patient is a 44 y.o. male presenting with shortness of breath. The history is provided by the patient.  Shortness of Breath He has a history of asthma and has noted increasing wheezing over the last week. There's been a cough productive of some clear sputum. He denies fever, chills, sweats. He denies any chest pain. There's been no nausea or vomiting. He had an albuterol inhaler but has run out. He also had been on Advair in the past and stated that it seemed to work very well but he had run out of that as well.  Past Medical History  Diagnosis Date  . Asthma    History reviewed. No pertinent past surgical history. Family History  Problem Relation Age of Onset  . Hypertension Mother   . Diabetes Mother    History  Substance Use Topics  . Smoking status: Former Games developermoker  . Smokeless tobacco: Not on file  . Alcohol Use: Yes     Comment: occassional    Review of Systems  Respiratory: Positive for shortness of breath.   All other systems reviewed and are negative.     Allergies  Shellfish allergy; Catfish; and Other  Home Medications   Prior to Admission medications   Medication Sig Start Date End Date Taking? Authorizing Provider  albuterol (PROVENTIL HFA;VENTOLIN HFA) 108 (90 BASE) MCG/ACT inhaler Inhale 1-2 puffs into the lungs every 6 (six) hours as needed for wheezing. 06/18/12   John-Adam Bonk, MD  albuterol (PROVENTIL HFA;VENTOLIN HFA) 108 (90 BASE) MCG/ACT inhaler Inhale 1-2 puffs into the lungs every 4 (four) hours as needed for wheezing or shortness of breath. 11/20/13   Blake DivineJohn Wofford, MD  Fluticasone-Salmeterol (ADVAIR DISKUS) 250-50 MCG/DOSE AEPB Inhale 1 puff into the lungs 2 (two) times  daily. 02/18/14   John Molpus, MD  loratadine (CLARITIN) 10 MG tablet Take 1 tablet (10 mg total) by mouth daily. 06/18/12   John-Adam Bonk, MD   BP 142/91 mmHg  Pulse 90  Temp(Src) 98.5 F (36.9 C) (Oral)  Resp 26  Ht 5\' 10"  (1.778 m)  Wt 240 lb (108.863 kg)  BMI 34.44 kg/m2  SpO2 95% Physical Exam  Nursing note and vitals reviewed.  44 year old male, resting comfortably and in no acute distress. Vital signs are significant for mild hypertension, and tachypnea. Oxygen saturation is 95%, which is normal. Head is normocephalic and atraumatic. PERRLA, EOMI. Oropharynx is clear. Neck is nontender and supple without adenopathy or JVD. Back is nontender and there is no CVA tenderness. Lungs are clear without rales, wheezes, or rhonchi. This exam was done following his getting a nebulizer treatment in the ED. Chest is nontender. Heart has regular rate and rhythm without murmur. Abdomen is soft, flat, nontender without masses or hepatosplenomegaly and peristalsis is normoactive. Extremities have no cyanosis or edema, full range of motion is present. Skin is warm and dry without rash. Neurologic: Mental status is normal, cranial nerves are intact, there are no motor or sensory deficits.  ED Course  Procedures (including critical care time)  Imaging Review Dg Chest 2 View  05/14/2014   CLINICAL DATA:  Acute onset of shortness of breath and mid chest discomfort. Initial encounter.  EXAM: CHEST  2 VIEW  COMPARISON:  Chest radiograph performed 02/17/2014  FINDINGS: The lungs are well-aerated and clear. There is no evidence of focal opacification, pleural effusion or pneumothorax.  The heart is normal in size; the mediastinal contour is within normal limits. No acute osseous abnormalities are seen.  IMPRESSION: No acute cardiopulmonary process seen.   Electronically Signed   By: Roanna Raider M.D.   On: 05/14/2014 01:40     MDM   Final diagnoses:  Asthma exacerbation    Asthma exacerbation  which is probably related to allergies. He is doing much better in the ED following a nebulizer treatment, which had been given prior to my evaluation. He is given a dose of prednisone and is discharged with prescriptions for prednisone, albuterol inhaler, and fluticasone-salmeterol inhaler. He has requested referral to a pulmonologist and he is referred to [facility]. Old records are reviewed confirming several prior ED visits for asthma.    Dione Booze, MD 05/14/14 [phone]

## 2016-04-16 IMAGING — CR DG CHEST 2V
2 series · 2 of 2 positions shown · non-contrast
Comparison: Chest radiograph performed 02/17/2014

CLINICAL DATA: Acute onset of shortness of breath and mid chest
discomfort. Initial encounter.

EXAM:
CHEST  2 VIEW

[w chest pa]
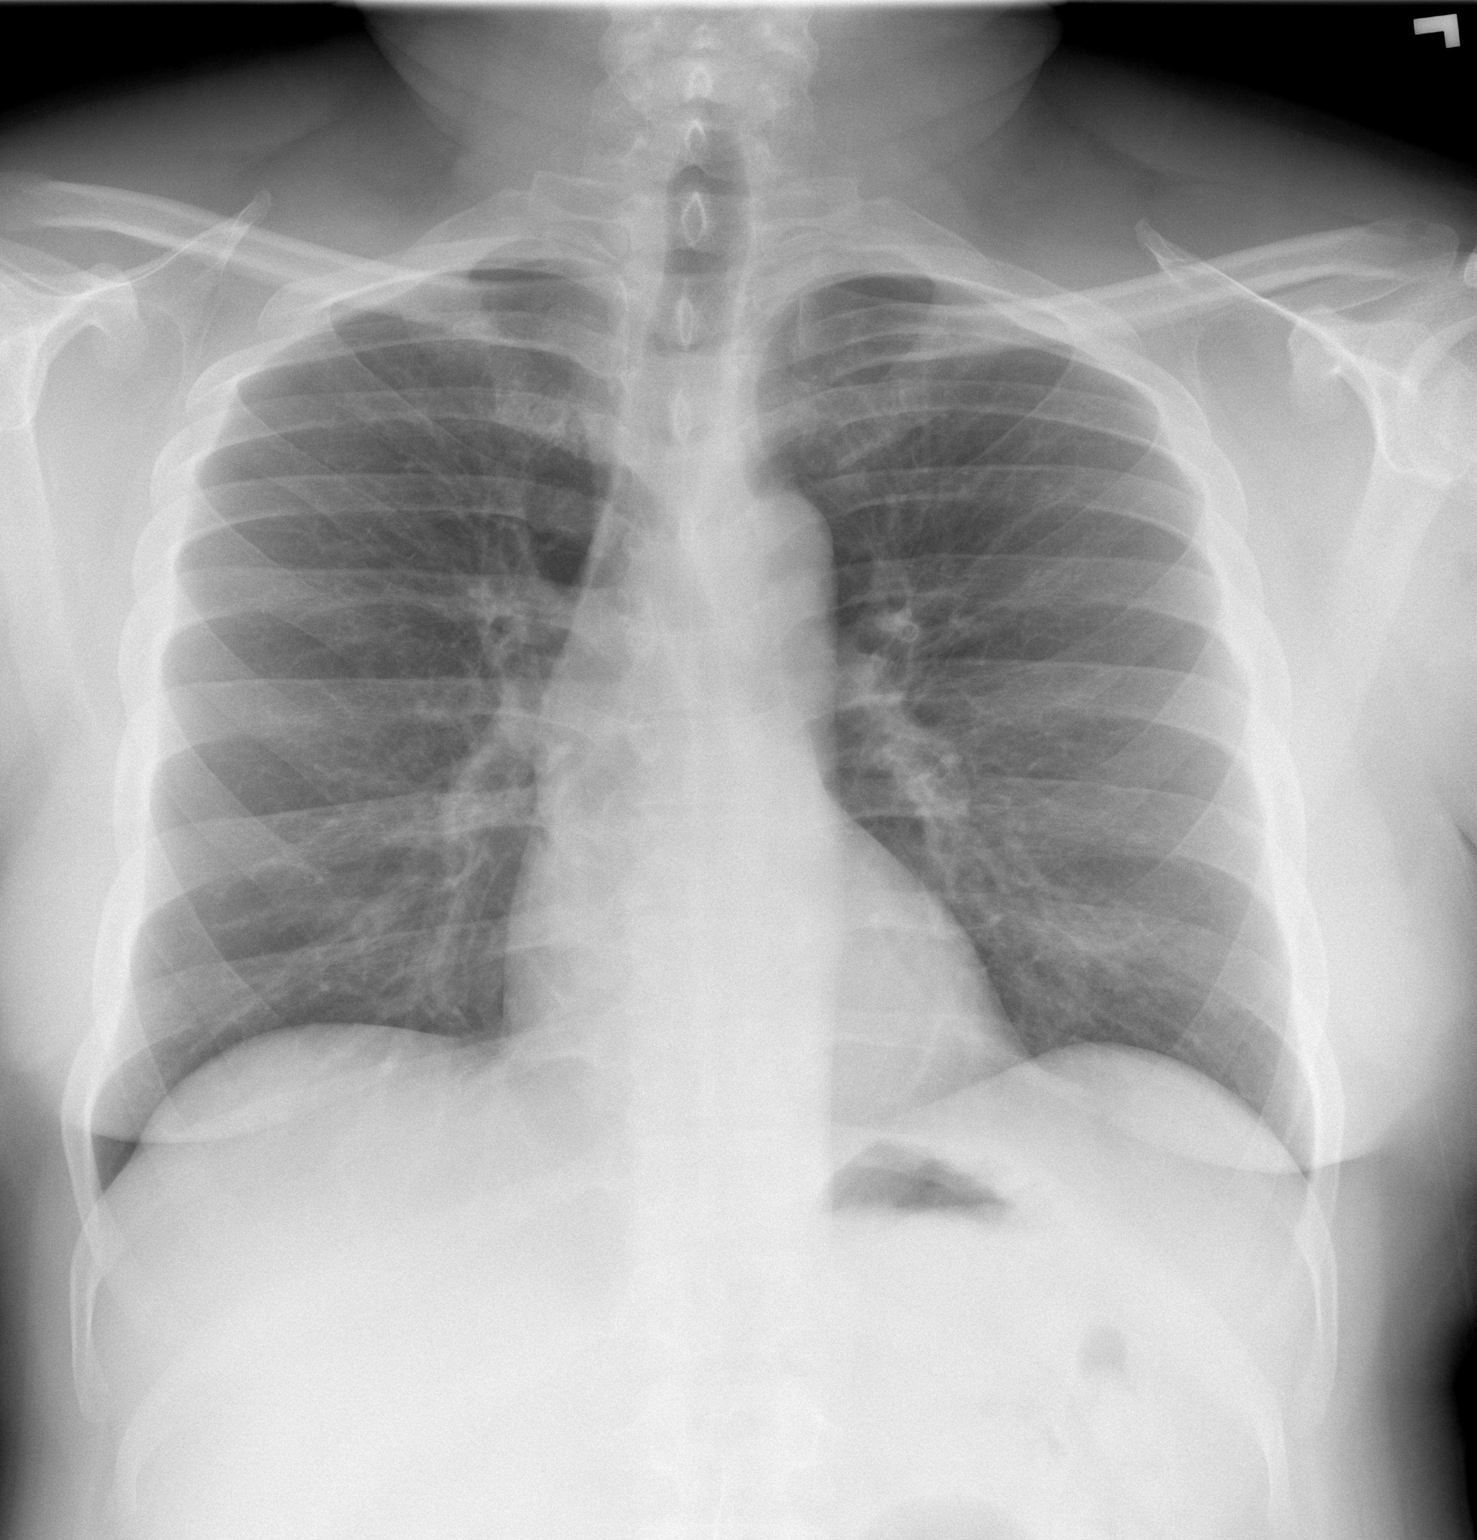

[w chest lat]
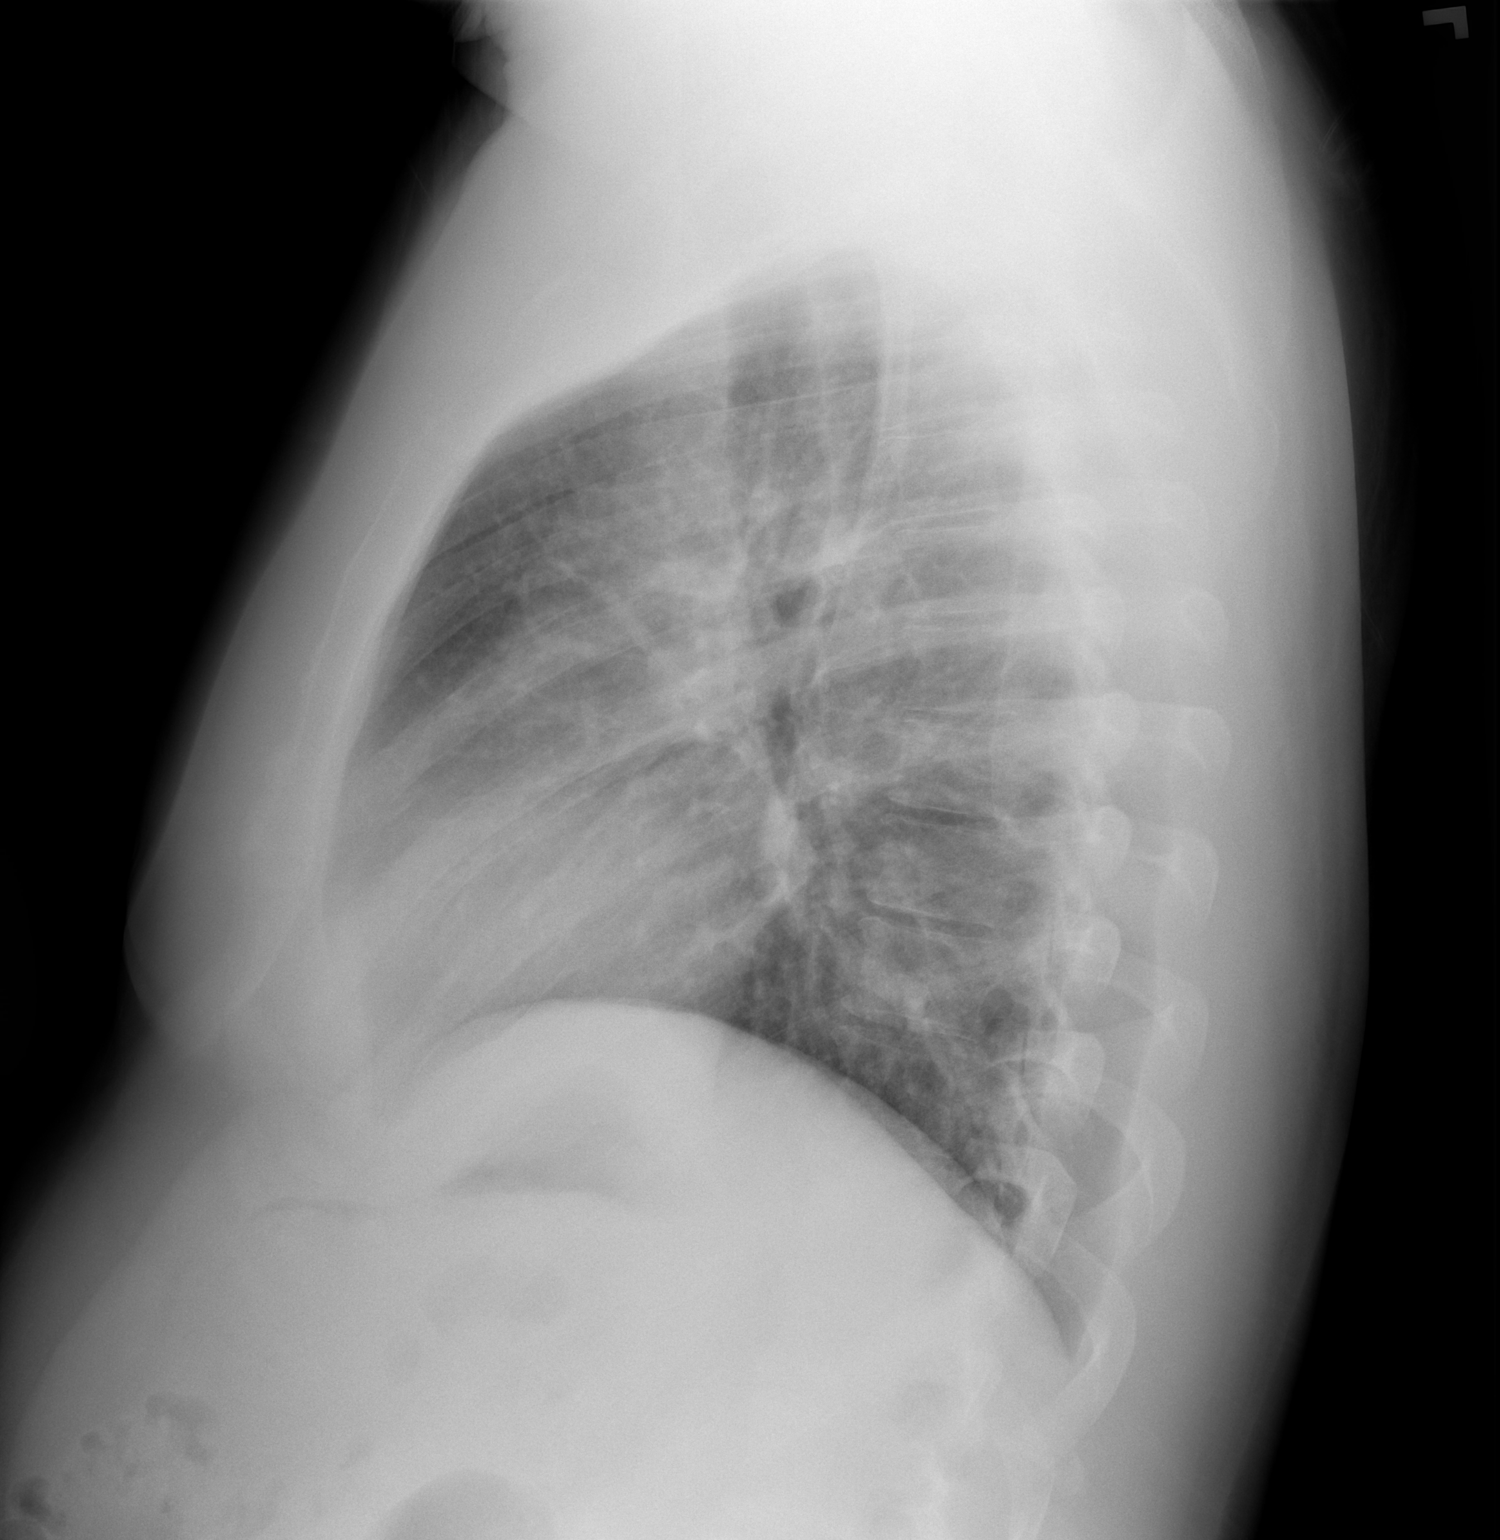

[2 of 2 positions shown; findings below may reference images not displayed]

FINDINGS: The lungs are well-aerated and clear. There is no evidence of focal
opacification, pleural effusion or pneumothorax.

The heart is normal in size; the mediastinal contour is within
normal limits. No acute osseous abnormalities are seen.
IMPRESSION: No acute cardiopulmonary process seen.

## 2019-03-07 ENCOUNTER — Telehealth: Payer: Self-pay | Admitting: *Deleted

## 2019-03-07 NOTE — Telephone Encounter (Signed)
Copied from CRM [id number]. Topic: Quick Communication - See Telephone Encounter >> Mar 07, 2019  9:45 AM Aretta Nip wrote: CRM for notification. See Telephone encounter for: 03/07/19. New pt papers

## 2019-05-02 ENCOUNTER — Other Ambulatory Visit: Payer: Self-pay

## 2019-05-03 ENCOUNTER — Ambulatory Visit (INDEPENDENT_AMBULATORY_CARE_PROVIDER_SITE_OTHER): Payer: BC Managed Care – PPO | Admitting: Internal Medicine

## 2019-05-03 ENCOUNTER — Encounter: Payer: Self-pay | Admitting: Internal Medicine

## 2019-05-03 ENCOUNTER — Encounter: Payer: Self-pay | Admitting: Gastroenterology

## 2019-05-03 VITALS — BP 102/64 | HR 75 | Temp 97.6°F | Ht 70.0 in | Wt 254.7 lb

## 2019-05-03 DIAGNOSIS — Z0000 Encounter for general adult medical examination without abnormal findings: Secondary | ICD-10-CM | POA: Diagnosis not present

## 2019-05-03 DIAGNOSIS — Z1211 Encounter for screening for malignant neoplasm of colon: Secondary | ICD-10-CM | POA: Diagnosis not present

## 2019-05-03 DIAGNOSIS — E669 Obesity, unspecified: Secondary | ICD-10-CM | POA: Diagnosis not present

## 2019-05-03 DIAGNOSIS — K219 Gastro-esophageal reflux disease without esophagitis: Secondary | ICD-10-CM

## 2019-05-03 DIAGNOSIS — H6123 Impacted cerumen, bilateral: Secondary | ICD-10-CM

## 2019-05-03 NOTE — Progress Notes (Signed)
New Patient Office Visit     This visit occurred during the SARS-CoV-2 public health emergency.  Safety protocols were in place, including screening questions prior to the visit, additional usage of staff PPE, and extensive cleaning of exam room while observing appropriate contact time as indicated for disinfecting solutions.    CC/Reason for Visit: Establish care, annual preventive exam Previous PCP: None Last Visit: Unknown  HPI: Jacob Williamson is a 49 y.o. male who is coming in today for the above mentioned reasons. Past Medical History is significant for: Obesity with a BMI of 36.5, GERD not currently on medications that improved since he quit drinking alcohol, asthma that is not currently active.  He works in [employer], does a lot of heavy lifting.  He is a single 10 his children are ages 65, 30 and 38.  He has no allergies, he was a smoker of half pack a day but quit in 2013, he used to be a binge drinker but quit in 2013, he has no known drug allergies.  His family history is significant for a father with hypertension and asthma, mother, maternal grandmother and maternal aunt with diabetes he has no acute complaints today and is here solely to establish care.   Past Medical/Surgical History: Past Medical History:  Diagnosis Date  . Asthma   . GERD (gastroesophageal reflux disease)   . Obesity (BMI 35.0-39.9 without comorbidity)     History reviewed. No pertinent surgical history.  Social History:  reports that he has quit smoking. He has never used smokeless tobacco. He reports current alcohol use. He reports that he does not use drugs.  Allergies: Allergies  Allergen Reactions  . Shellfish Allergy Anaphylaxis, Itching and Swelling  . Catfish [Fish Allergy] Itching and Other (See Comments)    Mainly focused on throat and inside mouth, itching   . Other     [place] nuts    Family History:  Family History  Problem Relation Age of Onset  .  Hypertension Mother   . Diabetes Mother   . Hypertension Father   . Asthma Father   . Diabetes Maternal Grandmother     No current outpatient medications on file.  Review of Systems:  Constitutional: Denies fever, chills, diaphoresis, appetite change and fatigue.  HEENT: Denies photophobia, eye pain, redness, hearing loss, ear pain, congestion, sore throat, rhinorrhea, sneezing, mouth sores, trouble swallowing, neck pain, neck stiffness and tinnitus.   Respiratory: Denies SOB, DOE, cough, chest tightness,  and wheezing.   Cardiovascular: Denies chest pain, palpitations and leg swelling.  Gastrointestinal: Denies nausea, vomiting, abdominal pain, diarrhea, constipation, blood in stool and abdominal distention.  Genitourinary: Denies dysuria, urgency, frequency, hematuria, flank pain and difficulty urinating.  Endocrine: Denies: hot or cold intolerance, sweats, changes in hair or nails, polyuria, polydipsia. Musculoskeletal: Denies myalgias, back pain, joint swelling, arthralgias and gait problem.  Skin: Denies pallor, rash and wound.  Neurological: Denies dizziness, seizures, syncope, weakness, light-headedness, numbness and headaches.  Hematological: Denies adenopathy. Easy bruising, personal or family bleeding history  Psychiatric/Behavioral: Denies suicidal ideation, mood changes, confusion, nervousness, sleep disturbance and agitation    Physical Exam: Vitals:   05/03/19 0804  BP: 102/64  Pulse: 75  Temp: 97.6 F (36.4 C)  TempSrc: Temporal  SpO2: 96%  Weight: 254 lb 11.2 oz (115.5 kg)  Height: 5' 10"  (1.778 m)   Body mass index is 36.55 kg/m.  Constitutional: NAD, calm, comfortable Eyes: PERRL, lids and conjunctivae normal, wears corrective lenses  ENMT: Mucous membranes are moist.Tympanic membrane is obstructed by cerumen bilaterally.   Neck: normal, supple, no masses, no thyromegaly Respiratory: clear to auscultation bilaterally, no wheezing, no crackles. Normal  respiratory effort. No accessory muscle use.  Cardiovascular: Regular rate and rhythm, no murmurs / rubs / gallops. No extremity edema. 2+ pedal pulses. No carotid bruits.  Abdomen: no tenderness, no masses palpated. No hepatosplenomegaly. Bowel sounds positive.  Musculoskeletal: no clubbing / cyanosis. No joint deformity upper and lower extremities. Good ROM, no contractures. Normal muscle tone.  Skin: no rashes, lesions, ulcers. No induration Neurologic: CN 2-12 grossly intact. Sensation intact, DTR normal. Strength 5/5 in all 4.  Psychiatric: Normal judgment and insight. Alert and oriented x 3. Normal mood.     Office Visit from 05/03/2019 in [place] [facility]  PHQ-9 Total Score  0       Impression and Plan:  Encounter for preventive health examination  -He has routine eye care, have advised routine dental care. -He is due for flu and tetanus vaccines today which he is declining at this time. -He will return for screening labs as he is nonfasting today. -PSA for prostate cancer screening. -GI referral for colon cancer screening. -Discussed healthy lifestyle detail.  Screening for malignant neoplasm of colon  - Plan: Ambulatory referral to Gastroenterology  Obesity (BMI 35.0-39.9 without comorbidity) -Discussed healthy lifestyle, including increased physical activity and better food choices to promote weight loss.  Gastroesophageal reflux disease without esophagitis -Well-controlled, not currently on medications.  Impacted cerumen, bilateral -Cerumen Desimpaction  After obtaining patient consent, warm water was applied and gentle ear lavage performed on bilateral ears. There were no complications and following the desimpaction the tympanic membranes were visible. Tympanic membranes are intact following the procedure. Auditory canals are normal. The patient reported relief of symptoms after removal of cerumen.      Patient Instructions  -Nice seeing you  today!!  -Lab work today; will notify you once results are available.  -You should get your tetanus vaccines.  -Will refer you to see GI for your screening colonoscopy.  -Schedule follow up in 1 year.   Preventive Care 8-56 Years Old, Male Preventive care refers to lifestyle choices and visits with your health care provider that can promote health and wellness. This includes:  A yearly physical exam. This is also called an annual well check.  Regular dental and eye exams.  Immunizations.  Screening for certain conditions.  Healthy lifestyle choices, such as eating a healthy diet, getting regular exercise, not using drugs or products that contain nicotine and tobacco, and limiting alcohol use. What can I expect for my preventive care visit? Physical exam Your health care provider will check:  Height and weight. These may be used to calculate body mass index (BMI), which is a measurement that tells if you are at a healthy weight.  Heart rate and blood pressure.  Your skin for abnormal spots. Counseling Your health care provider may ask you questions about:  Alcohol, tobacco, and drug use.  Emotional well-being.  Home and relationship well-being.  Sexual activity.  Eating habits.  Work and work Statistician. What immunizations do I need?  Influenza (flu) vaccine  This is recommended every year. Tetanus, diphtheria, and pertussis (Tdap) vaccine  You may need a Td booster every 10 years. Varicella (chickenpox) vaccine  You may need this vaccine if you have not already been vaccinated. Zoster (shingles) vaccine  You may need this after age 75. Measles, mumps, and rubella (MMR)  vaccine  You may need at least one dose of MMR if you were born in 1957 or later. You may also need a second dose. Pneumococcal conjugate (PCV13) vaccine  You may need this if you have certain conditions and were not previously vaccinated. Pneumococcal polysaccharide (PPSV23)  vaccine  You may need one or two doses if you smoke cigarettes or if you have certain conditions. Meningococcal conjugate (MenACWY) vaccine  You may need this if you have certain conditions. Hepatitis A vaccine  You may need this if you have certain conditions or if you travel or work in places where you may be exposed to hepatitis A. Hepatitis B vaccine  You may need this if you have certain conditions or if you travel or work in places where you may be exposed to hepatitis B. Haemophilus influenzae type b (Hib) vaccine  You may need this if you have certain risk factors. Human papillomavirus (HPV) vaccine  If recommended by your health care provider, you may need three doses over 6 months. You may receive vaccines as individual doses or as more than one vaccine together in one shot (combination vaccines). Talk with your health care provider about the risks and benefits of combination vaccines. What tests do I need? Blood tests  Lipid and cholesterol levels. These may be checked every 5 years, or more frequently if you are over 33 years old.  Hepatitis C test.  Hepatitis B test. Screening  Lung cancer screening. You may have this screening every year starting at age 80 if you have a 30-pack-year history of smoking and currently smoke or have quit within the past 15 years.  Prostate cancer screening. Recommendations will vary depending on your family history and other risks.  Colorectal cancer screening. All adults should have this screening starting at age 28 and continuing until age 51. Your health care provider may recommend screening at age 79 if you are at increased risk. You will have tests every 1-10 years, depending on your results and the type of screening test.  Diabetes screening. This is done by checking your blood sugar (glucose) after you have not eaten for a while (fasting). You may have this done every 1-3 years.  Sexually transmitted disease (STD)  testing. Follow these instructions at home: Eating and drinking  Eat a diet that includes fresh fruits and vegetables, whole grains, lean protein, and low-fat dairy products.  Take vitamin and mineral supplements as recommended by your health care provider.  Do not drink alcohol if your health care provider tells you not to drink.  If you drink alcohol: ? Limit how much you have to 0-2 drinks a day. ? Be aware of how much alcohol is in your drink. In the [place] one drink equals one 12 oz bottle of beer (355 mL), one 5 oz glass of wine (148 mL), or one 1 oz glass of hard liquor (44 mL). Lifestyle  Take daily care of your teeth and gums.  Stay active. Exercise for at least 30 minutes on 5 or more days each week.  Do not use any products that contain nicotine or tobacco, such as cigarettes, e-cigarettes, and chewing tobacco. If you need help quitting, ask your health care provider.  If you are sexually active, practice safe sex. Use a condom or other form of protection to prevent STIs (sexually transmitted infections).  Talk with your health care provider about taking a low-dose aspirin every day starting at age 3. What's next?  Go to your health  care provider once a year for a well check visit.  Ask your health care provider how often you should have your eyes and teeth checked.  Stay up to date on all vaccines. This information is not intended to replace advice given to you by your health care provider. Make sure you discuss any questions you have with your health care provider. Document Revised: 01/25/2018 Document Reviewed: 01/25/2018 Elsevier Patient Education  2020 [place], MD [facility]

## 2019-05-03 NOTE — Patient Instructions (Addendum)
-Nice seeing you today!!  -Lab work today; will notify you once results are available.  -You should get your tetanus vaccines.  -Will refer you to see GI for your screening colonoscopy.  -Schedule follow up in 1 year.   Preventive Care 84-47 Years Old, Male Preventive care refers to lifestyle choices and visits with your health care provider that can promote health and wellness. This includes:  A yearly physical exam. This is also called an annual well check.  Regular dental and eye exams.  Immunizations.  Screening for certain conditions.  Healthy lifestyle choices, such as eating a healthy diet, getting regular exercise, not using drugs or products that contain nicotine and tobacco, and limiting alcohol use. What can I expect for my preventive care visit? Physical exam Your health care provider will check:  Height and weight. These may be used to calculate body mass index (BMI), which is a measurement that tells if you are at a healthy weight.  Heart rate and blood pressure.  Your skin for abnormal spots. Counseling Your health care provider may ask you questions about:  Alcohol, tobacco, and drug use.  Emotional well-being.  Home and relationship well-being.  Sexual activity.  Eating habits.  Work and work Statistician. What immunizations do I need?  Influenza (flu) vaccine  This is recommended every year. Tetanus, diphtheria, and pertussis (Tdap) vaccine  You may need a Td booster every 10 years. Varicella (chickenpox) vaccine  You may need this vaccine if you have not already been vaccinated. Zoster (shingles) vaccine  You may need this after age 50. Measles, mumps, and rubella (MMR) vaccine  You may need at least one dose of MMR if you were born in 1957 or later. You may also need a second dose. Pneumococcal conjugate (PCV13) vaccine  You may need this if you have certain conditions and were not previously vaccinated. Pneumococcal polysaccharide  (PPSV23) vaccine  You may need one or two doses if you smoke cigarettes or if you have certain conditions. Meningococcal conjugate (MenACWY) vaccine  You may need this if you have certain conditions. Hepatitis A vaccine  You may need this if you have certain conditions or if you travel or work in places where you may be exposed to hepatitis A. Hepatitis B vaccine  You may need this if you have certain conditions or if you travel or work in places where you may be exposed to hepatitis B. Haemophilus influenzae type b (Hib) vaccine  You may need this if you have certain risk factors. Human papillomavirus (HPV) vaccine  If recommended by your health care provider, you may need three doses over 6 months. You may receive vaccines as individual doses or as more than one vaccine together in one shot (combination vaccines). Talk with your health care provider about the risks and benefits of combination vaccines. What tests do I need? Blood tests  Lipid and cholesterol levels. These may be checked every 5 years, or more frequently if you are over 31 years old.  Hepatitis C test.  Hepatitis B test. Screening  Lung cancer screening. You may have this screening every year starting at age 74 if you have a 30-pack-year history of smoking and currently smoke or have quit within the past 15 years.  Prostate cancer screening. Recommendations will vary depending on your family history and other risks.  Colorectal cancer screening. All adults should have this screening starting at age 74 and continuing until age 46. Your health care provider may recommend screening at  age 11 if you are at increased risk. You will have tests every 1-10 years, depending on your results and the type of screening test.  Diabetes screening. This is done by checking your blood sugar (glucose) after you have not eaten for a while (fasting). You may have this done every 1-3 years.  Sexually transmitted disease (STD)  testing. Follow these instructions at home: Eating and drinking  Eat a diet that includes fresh fruits and vegetables, whole grains, lean protein, and low-fat dairy products.  Take vitamin and mineral supplements as recommended by your health care provider.  Do not drink alcohol if your health care provider tells you not to drink.  If you drink alcohol: ? Limit how much you have to 0-2 drinks a day. ? Be aware of how much alcohol is in your drink. In the [place] one drink equals one 12 oz bottle of beer (355 mL), one 5 oz glass of wine (148 mL), or one 1 oz glass of hard liquor (44 mL). Lifestyle  Take daily care of your teeth and gums.  Stay active. Exercise for at least 30 minutes on 5 or more days each week.  Do not use any products that contain nicotine or tobacco, such as cigarettes, e-cigarettes, and chewing tobacco. If you need help quitting, ask your health care provider.  If you are sexually active, practice safe sex. Use a condom or other form of protection to prevent STIs (sexually transmitted infections).  Talk with your health care provider about taking a low-dose aspirin every day starting at age 11. What's next?  Go to your health care provider once a year for a well check visit.  Ask your health care provider how often you should have your eyes and teeth checked.  Stay up to date on all vaccines. This information is not intended to replace advice given to you by your health care provider. Make sure you discuss any questions you have with your health care provider. Document Revised: 01/25/2018 Document Reviewed: 01/25/2018 Elsevier Patient Education  2020 [employer].

## 2019-05-06 ENCOUNTER — Other Ambulatory Visit (INDEPENDENT_AMBULATORY_CARE_PROVIDER_SITE_OTHER): Payer: BC Managed Care – PPO

## 2019-05-06 ENCOUNTER — Ambulatory Visit: Payer: BC Managed Care – PPO | Admitting: *Deleted

## 2019-05-06 ENCOUNTER — Other Ambulatory Visit: Payer: Self-pay

## 2019-05-06 DIAGNOSIS — Z23 Encounter for immunization: Secondary | ICD-10-CM

## 2019-05-06 DIAGNOSIS — Z0000 Encounter for general adult medical examination without abnormal findings: Secondary | ICD-10-CM | POA: Diagnosis not present

## 2019-05-06 LAB — COMPREHENSIVE METABOLIC PANEL
ALT: 19 U/L (ref 0–53)
AST: 14 U/L (ref 0–37)
Albumin: 4.2 g/dL (ref 3.5–5.2)
Alkaline Phosphatase: 69 U/L (ref 39–117)
BUN: 19 mg/dL (ref 6–23)
CO2: 30 mEq/L (ref 19–32)
Calcium: 9.9 mg/dL (ref 8.4–10.5)
Chloride: 104 mEq/L (ref 96–112)
Creatinine, Ser: 1.26 mg/dL (ref 0.40–1.50)
GFR: 74.33 mL/min (ref >60.00)
Glucose, Bld: 111 mg/dL — ABNORMAL HIGH (ref 70–99)
Potassium: 4.4 mEq/L (ref 3.5–5.1)
Sodium: 140 mEq/L (ref 135–145)
Total Bilirubin: 0.3 mg/dL (ref 0.2–1.2)
Total Protein: 6.9 g/dL (ref 6.0–8.3)

## 2019-05-06 LAB — CBC WITH DIFFERENTIAL/PLATELET
Basophils Absolute: 0 10*3/uL (ref 0.0–0.1)
Basophils Relative: 0.4 % (ref 0.0–3.0)
Eosinophils Absolute: 0.2 10*3/uL (ref 0.0–0.7)
Eosinophils Relative: 2.2 % (ref 0.0–5.0)
HCT: 43.9 % (ref 39.0–52.0)
Hemoglobin: 14.2 g/dL (ref 13.0–17.0)
Lymphocytes Relative: 51.6 % — ABNORMAL HIGH (ref 12.0–46.0)
Lymphs Abs: 3.8 10*3/uL (ref 0.7–4.0)
MCHC: 32.2 g/dL (ref 30.0–36.0)
MCV: 81.3 fl (ref 78.0–100.0)
Monocytes Absolute: 0.4 10*3/uL (ref 0.1–1.0)
Monocytes Relative: 5.9 % (ref 3.0–12.0)
Neutro Abs: 3 10*3/uL (ref 1.4–7.7)
Neutrophils Relative %: 39.9 % — ABNORMAL LOW (ref 43.0–77.0)
Platelets: 251 10*3/uL (ref 150.0–400.0)
RBC: 5.4 Mil/uL (ref 4.22–5.81)
RDW: 14.5 % (ref 11.5–15.5)
WBC: 7.4 10*3/uL (ref 4.0–10.5)

## 2019-05-06 LAB — VITAMIN D 25 HYDROXY (VIT D DEFICIENCY, FRACTURES): VITD: 11.7 ng/mL — ABNORMAL LOW (ref 30.00–100.00)

## 2019-05-06 LAB — LIPID PANEL
Cholesterol: 189 mg/dL (ref 0–200)
HDL: 40 mg/dL (ref >39.00)
LDL Cholesterol: 120 mg/dL — ABNORMAL HIGH (ref 0–99)
NonHDL: 148.5
Total CHOL/HDL Ratio: 5
Triglycerides: 145 mg/dL (ref 0.0–149.0)
VLDL: 29 mg/dL (ref 0.0–40.0)

## 2019-05-06 LAB — PSA: PSA: 0.69 ng/mL (ref 0.10–4.00)

## 2019-05-06 LAB — VITAMIN B12: Vitamin B-12: 155 pg/mL — ABNORMAL LOW (ref 211–911)

## 2019-05-06 LAB — TSH: TSH: 1.22 u[IU]/mL (ref 0.35–4.50)

## 2019-05-06 LAB — HEMOGLOBIN A1C: Hgb A1c MFr Bld: 6.1 % (ref 4.6–6.5)

## 2019-05-07 ENCOUNTER — Other Ambulatory Visit: Payer: Self-pay | Admitting: Internal Medicine

## 2019-05-07 ENCOUNTER — Encounter: Payer: Self-pay | Admitting: Internal Medicine

## 2019-05-07 DIAGNOSIS — E559 Vitamin D deficiency, unspecified: Secondary | ICD-10-CM | POA: Insufficient documentation

## 2019-05-07 DIAGNOSIS — E538 Deficiency of other specified B group vitamins: Secondary | ICD-10-CM | POA: Insufficient documentation

## 2019-05-07 DIAGNOSIS — E785 Hyperlipidemia, unspecified: Secondary | ICD-10-CM | POA: Insufficient documentation

## 2019-05-07 DIAGNOSIS — R7302 Impaired glucose tolerance (oral): Secondary | ICD-10-CM | POA: Insufficient documentation

## 2019-05-07 MED ORDER — CYANOCOBALAMIN 1000 MCG/ML IJ SOLN: INTRAMUSCULAR | 1 refills | Status: DC

## 2019-05-07 MED ORDER — "BD SAFETYGLIDE SYRINGE/NEEDLE 25G X 1"" 3 ML MISC": 11 refills | Status: DC

## 2019-05-07 MED ORDER — VITAMIN D (ERGOCALCIFEROL) 1.25 MG (50000 UNIT) PO CAPS: 50000.0000 [IU] | ORAL_CAPSULE | ORAL | 0 refills | Status: AC

## 2019-05-08 ENCOUNTER — Telehealth (INDEPENDENT_AMBULATORY_CARE_PROVIDER_SITE_OTHER): Payer: BC Managed Care – PPO | Admitting: Internal Medicine

## 2019-05-08 ENCOUNTER — Ambulatory Visit (AMBULATORY_SURGERY_CENTER): Payer: Self-pay | Admitting: *Deleted

## 2019-05-08 ENCOUNTER — Other Ambulatory Visit: Payer: Self-pay

## 2019-05-08 VITALS — Temp 96.8°F | Ht 70.0 in | Wt 250.0 lb

## 2019-05-08 DIAGNOSIS — N529 Male erectile dysfunction, unspecified: Secondary | ICD-10-CM

## 2019-05-08 DIAGNOSIS — E538 Deficiency of other specified B group vitamins: Secondary | ICD-10-CM | POA: Diagnosis not present

## 2019-05-08 DIAGNOSIS — Z01818 Encounter for other preprocedural examination: Secondary | ICD-10-CM

## 2019-05-08 DIAGNOSIS — E559 Vitamin D deficiency, unspecified: Secondary | ICD-10-CM

## 2019-05-08 DIAGNOSIS — E785 Hyperlipidemia, unspecified: Secondary | ICD-10-CM | POA: Diagnosis not present

## 2019-05-08 DIAGNOSIS — E669 Obesity, unspecified: Secondary | ICD-10-CM

## 2019-05-08 DIAGNOSIS — Z1211 Encounter for screening for malignant neoplasm of colon: Secondary | ICD-10-CM

## 2019-05-08 DIAGNOSIS — R7302 Impaired glucose tolerance (oral): Secondary | ICD-10-CM

## 2019-05-08 NOTE — Progress Notes (Signed)
Virtual Visit via Telephone Note  I connected with Jacob Williamson on 05/08/19 at  3:00 PM EDT by telephone and verified that I am speaking with the correct person using two identifiers.   I discussed the limitations, risks, security and privacy concerns of performing an evaluation and management service by telephone and the availability of in person appointments. I also discussed with the patient that there may be a patient responsible charge related to this service. The patient expressed understanding and agreed to proceed.  Location patient: home Location provider: work office Participants present for the call: patient, provider Patient did not have a visit in the prior 7 days to address this/these issue(s).   History of Present Illness:  He has scheduled this visit to follow-up on his lab results from last week.  During his physical lab work he was found to have vitamin D and vitamin B12 deficiency, impaired glucose tolerance and hyperlipidemia.  He also like to talk about some mild erectile dysfunction and whether any of these nutritional deficiencies could be contributing.  Erectile dysfunction does not happen all the time.  He is a little bit hesitant to do intramuscular vitamin B12 supplementation.   Observations/Objective: Patient sounds cheerful and well on the phone. I do not appreciate any increased work of breathing. Speech and thought processing are grossly intact. Patient reported vitals: None reported   Current Outpatient Medications:  .  cyanocobalamin (,VITAMIN B-12,) 1000 MCG/ML injection, Inject 57ml in deltoid once weekly for 4 weeks, then inject 1 ml once a month thereafter (Patient not taking: Reported on 05/08/2019), Disp: 6 mL, Rfl: 1 .  SYRINGE-NEEDLE, DISP, 3 ML (BD SAFETYGLIDE SYRINGE/NEEDLE) 25G X 1" 3 ML MISC, Use for B12 injections (Patient not taking: Reported on 05/08/2019), Disp: 100 each, Rfl: 11 .  Vitamin D, Ergocalciferol, (DRISDOL) 1.25 MG  (50000 UNIT) CAPS capsule, Take 1 capsule (50,000 Units total) by mouth every 7 (seven) days for 12 doses. (Patient not taking: Reported on 05/08/2019), Disp: 12 capsule, Rfl: 0 .  Multiple Vitamin (MULTIVITAMIN) tablet, Take 1 tablet by mouth daily. Alive, Disp: , Rfl:   Review of Systems:  Constitutional: Denies fever, chills, diaphoresis, appetite change and fatigue.  HEENT: Denies photophobia, eye pain, redness, hearing loss, ear pain, congestion, sore throat, rhinorrhea, sneezing, mouth sores, trouble swallowing, neck pain, neck stiffness and tinnitus.   Respiratory: Denies SOB, DOE, cough, chest tightness,  and wheezing.   Cardiovascular: Denies chest pain, palpitations and leg swelling.  Gastrointestinal: Denies nausea, vomiting, abdominal pain, diarrhea, constipation, blood in stool and abdominal distention.  Genitourinary: Denies dysuria, urgency, frequency, hematuria, flank pain and difficulty urinating.  Endocrine: Denies: hot or cold intolerance, sweats, changes in hair or nails, polyuria, polydipsia. Musculoskeletal: Denies myalgias, back pain, joint swelling, arthralgias and gait problem.  Skin: Denies pallor, rash and wound.  Neurological: Denies dizziness, seizures, syncope, weakness, light-headedness, numbness and headaches.  Hematological: Denies adenopathy. Easy bruising, personal or family bleeding history  Psychiatric/Behavioral: Denies suicidal ideation, mood changes, confusion, nervousness, sleep disturbance and agitation   Assessment and Plan:  Vitamin D deficiency -He agrees to take 50,000 units for weekly for 12 weeks with follow-up anticoagulation.  Vitamin B12 deficiency -He will come in office to do his first B12 injection, he agrees to do at least 6 months of iron supplementation after discussion with him today.  IGT (impaired glucose tolerance) -A1c was 6.1, we have discussed lifestyle modifications including weight loss, diet and exercise in order to prevent  progression to future diabetes.  Hyperlipidemia, unspecified hyperlipidemia type -LDL is 120, as above lifestyle modifications, no need for medications yet.  Obesity (BMI 35.0-39.9 without comorbidity) -Discussed healthy lifestyle, including increased physical activity and better food choices to promote weight loss.  Erectile dysfunction, unspecified erectile dysfunction type -Next time he is in office, will do a cardiac work-up with EKG, continue to work on lifestyle modifications which I suspect will improve his overall cardiovascular and sexual health.    I discussed the assessment and treatment plan with the patient. The patient was provided an opportunity to ask questions and all were answered. The patient agreed with the plan and demonstrated an understanding of the instructions.   The patient was advised to call back or seek an in-person evaluation if the symptoms worsen or if the condition fails to improve as anticipated.  I provided 22 minutes of non-face-to-face time during this encounter.   Lelon Frohlich, MD [facility]

## 2019-05-08 NOTE — Progress Notes (Signed)
No egg or soy allergy known to patient  No past  Sedation or surgeries  or procedures, no past  intubation problems  No diet pills per patient No home 02 use per patient  No blood thinners per patient  Pt denies issues with constipation  No A fib or A flutter  EMMI video sent to pt's e mail   Due to the COVID-19 pandemic we are asking patients to follow these guidelines. Please only bring one care partner. Please be aware that your care partner may wait in the car in the parking lot or if they feel like they will be too hot to wait in the car, they may wait in the lobby on the 4th floor. All care partners are required to wear a mask the entire time (we do not have any that we can provide them), they need to practice social distancing, and we will do a Covid check for all patient's and care partners when you arrive. Also we will check their temperature and your temperature. If the care partner waits in their car they need to stay in the parking lot the entire time and we will call them on their cell phone when the patient is ready for discharge so they can bring the car to the front of the building. Also all patient's will need to wear a mask into building.  Suprep not covered when Suprep placed in orders- changed to Miralax prep

## 2019-05-10 ENCOUNTER — Other Ambulatory Visit: Payer: Self-pay

## 2019-05-13 ENCOUNTER — Ambulatory Visit (INDEPENDENT_AMBULATORY_CARE_PROVIDER_SITE_OTHER): Payer: BC Managed Care – PPO | Admitting: *Deleted

## 2019-05-13 ENCOUNTER — Other Ambulatory Visit: Payer: Self-pay

## 2019-05-13 DIAGNOSIS — E538 Deficiency of other specified B group vitamins: Secondary | ICD-10-CM

## 2019-05-13 MED ORDER — CYANOCOBALAMIN 1000 MCG/ML IJ SOLN
1000.0000 ug | Freq: Once | INTRAMUSCULAR | Status: AC
  Administered 2019-05-13: 15:00:00 1000 ug via INTRAMUSCULAR

## 2019-05-13 NOTE — Progress Notes (Signed)
Per orders of Dr. Burchette, injection of Cyanocobalamin 1000mcg given by Gunda Maqueda A. Patient tolerated injection well.  

## 2019-05-15 ENCOUNTER — Ambulatory Visit (INDEPENDENT_AMBULATORY_CARE_PROVIDER_SITE_OTHER): Payer: BC Managed Care – PPO

## 2019-05-15 ENCOUNTER — Other Ambulatory Visit: Payer: Self-pay | Admitting: Gastroenterology

## 2019-05-15 DIAGNOSIS — Z1159 Encounter for screening for other viral diseases: Secondary | ICD-10-CM

## 2019-05-15 DIAGNOSIS — Z1152 Encounter for screening for COVID-19: Secondary | ICD-10-CM | POA: Diagnosis not present

## 2019-05-15 LAB — SARS CORONAVIRUS 2 (TAT 6-24 HRS): SARS Coronavirus 2: NEGATIVE

## 2019-05-16 ENCOUNTER — Encounter: Payer: Self-pay | Admitting: Gastroenterology

## 2019-05-17 ENCOUNTER — Ambulatory Visit: Payer: Self-pay | Admitting: Internal Medicine

## 2019-05-20 ENCOUNTER — Ambulatory Visit (AMBULATORY_SURGERY_CENTER): Payer: BC Managed Care – PPO | Admitting: Gastroenterology

## 2019-05-20 ENCOUNTER — Other Ambulatory Visit: Payer: Self-pay

## 2019-05-20 ENCOUNTER — Encounter: Payer: Self-pay | Admitting: Gastroenterology

## 2019-05-20 VITALS — BP 125/88 | HR 76 | Resp 20 | Ht 70.0 in | Wt 250.0 lb

## 2019-05-20 DIAGNOSIS — Z1211 Encounter for screening for malignant neoplasm of colon: Secondary | ICD-10-CM | POA: Diagnosis not present

## 2019-05-20 HISTORY — PX: COLONOSCOPY: SHX174

## 2019-05-20 MED ORDER — SODIUM CHLORIDE 0.9 % IV SOLN: 500.0000 mL | Freq: Once | INTRAVENOUS | Status: DC

## 2019-05-20 NOTE — Progress Notes (Signed)
To PACU, VSS. Report to [email or web address] 

## 2019-05-20 NOTE — Patient Instructions (Signed)
Handouts provided on diverticulosis and high fiber diet.   High fiber diet recommended. Drink at least 64 ounces of water daily. Add a daily stool bulking agent such a psyllium (an example would be Metamucil).  YOU HAD AN ENDOSCOPIC PROCEDURE TODAY AT THE [facility]:   Refer to the procedure report that was given to you for any specific questions about what was found during the examination.  If the procedure report does not answer your questions, please call your gastroenterologist to clarify.  If you requested that your care partner not be given the details of your procedure findings, then the procedure report has been included in a sealed envelope for you to review at your convenience later.  YOU SHOULD EXPECT: Some feelings of bloating in the abdomen. Passage of more gas than usual.  Walking can help get rid of the air that was put into your GI tract during the procedure and reduce the bloating. If you had a lower endoscopy (such as a colonoscopy or flexible sigmoidoscopy) you may notice spotting of blood in your stool or on the toilet paper. If you underwent a bowel prep for your procedure, you may not have a normal bowel movement for a few days.  Please Note:  You might notice some irritation and congestion in your nose or some drainage.  This is from the oxygen used during your procedure.  There is no need for concern and it should clear up in a day or so.  SYMPTOMS TO REPORT IMMEDIATELY:   Following lower endoscopy (colonoscopy or flexible sigmoidoscopy):  Excessive amounts of blood in the stool  Significant tenderness or worsening of abdominal pains  Swelling of the abdomen that is new, acute  Fever of 100F or higher   For urgent or emergent issues, a gastroenterologist can be reached at any hour by calling [phone]. Do not use MyChart messaging for urgent concerns.    DIET:  We do recommend a small meal at first, but then you may proceed to your regular diet.   Drink plenty of fluids but you should avoid alcoholic beverages for 24 hours.  ACTIVITY:  You should plan to take it easy for the rest of today and you should NOT DRIVE or use heavy machinery until tomorrow (because of the sedation medicines used during the test).    FOLLOW UP: Our staff will call the number listed on your records 48-72 hours following your procedure to check on you and address any questions or concerns that you may have regarding the information given to you following your procedure. If we do not reach you, we will leave a message.  We will attempt to reach you two times.  During this call, we will ask if you have developed any symptoms of COVID 19. If you develop any symptoms (ie: fever, flu-like symptoms, shortness of breath, cough etc.) before then, please call [phone].  If you test positive for Covid 19 in the 2 weeks post procedure, please call and report this information to [place].    If any biopsies were taken you will be contacted by phone or by letter within the next 1-3 weeks.  Please call us at [phone] if you have not heard about the biopsies in 3 weeks.    SIGNATURES/CONFIDENTIALITY: You and/or your care partner have signed paperwork which will be entered into your electronic medical record.  These signatures attest to the fact that that the information above on your After Visit Summary has been reviewed  and is understood.  Full responsibility of the confidentiality of this discharge information lies with you and/or your care-partner.

## 2019-05-20 NOTE — Progress Notes (Signed)
Temp check by: LC Vital check by:CW  The patient states no changes in medical or surgical history since pre-visit screening on 05/08/2019.

## 2019-05-20 NOTE — Op Note (Signed)
[facility] Patient Name: Jacob Williamson Procedure Date: 05/20/2019 2:50 PM MRN: [id number] Endoscopist: Thornton Park MD, MD Age: 49 Referring MD:  Date of Birth: 07-04-1970 Gender: Male Account #: [id number] Procedure:                Colonoscopy Indications:              Screening for colorectal malignant neoplasm, This                            is the patient's first colonoscopy                           No known family history of colon cancer or polyps. Medicines:                Monitored Anesthesia Care Procedure:                Pre-Anesthesia Assessment:                           - Prior to the procedure, a History and Physical                            was performed, and patient medications and                            allergies were reviewed. The patient's tolerance of                            previous anesthesia was also reviewed. The risks                            and benefits of the procedure and the sedation                            options and risks were discussed with the patient.                            All questions were answered, and informed consent                            was obtained. Prior Anticoagulants: The patient has                            taken no previous anticoagulant or antiplatelet                            agents. ASA Grade Assessment: II - A patient with                            mild systemic disease. After reviewing the risks                            and benefits, the patient was deemed in  satisfactory condition to undergo the procedure.                           After obtaining informed consent, the colonoscope                            was passed under direct vision. Throughout the                            procedure, the patient's blood pressure, pulse, and                            oxygen saturations were monitored continuously. The                            Colonoscope was  introduced through the anus and                            advanced to the the cecum, identified by                            appendiceal orifice and ileocecal valve. A second                            forward view of the right colon was performed. The                            colonoscopy was performed without difficulty. The                            patient tolerated the procedure well. The quality                            of the bowel preparation was good. The ileocecal                            valve, appendiceal orifice, and rectum were                            photographed. Scope In: 3:07:38 PM Scope Out: 3:22:36 PM Scope Withdrawal Time: 0 hours 10 minutes 37 seconds  Total Procedure Duration: 0 hours 14 minutes 58 seconds  Findings:                 Many small-mouthed diverticula were found in the                            sigmoid colon and descending colon.                           The exam was otherwise without abnormality on                            direct and retroflexion views. No polyps were  identified. Complications:            No immediate complications. Estimated Blood Loss:     Estimated blood loss: none. Impression:               - Diverticulosis in the sigmoid colon and in the                            descending colon.                           - The examination was otherwise normal on direct                            and retroflexion views.                           - No specimens collected. Recommendation:           - Patient has a contact number available for                            emergencies. The signs and symptoms of potential                            delayed complications were discussed with the                            patient. Return to normal activities tomorrow.                            Written discharge instructions were provided to the                            patient.                           -  Resume previous diet. High fiber diet                            recommended. Drink at least 64 ounces of water                            daily. Add a daily stool bulking agent such as                            psyllium.                           - Continue present medications.                           - Repeat colonoscopy in 10 years for screening                            purposes, earlier with any new symptoms.                           -  Emerging evidence supports eating a diet of                            fruits, vegetables, grains, calcium, and yogurt                            while reducing red meat and alcohol may reduce the                            risk of colon cancer.                           - Thank you for allowing me to be involved in your                            colon cancer prevention. Tressia Danas MD, MD 05/20/2019 3:35:18 PM This report has been signed electronically.

## 2019-05-20 NOTE — Progress Notes (Signed)
Patient did not take second half of prep. Rescheduled to come back in today at 330pm .

## 2019-05-22 ENCOUNTER — Telehealth: Payer: Self-pay

## 2019-05-22 ENCOUNTER — Telehealth: Payer: Self-pay | Admitting: Gastroenterology

## 2019-05-22 NOTE — Telephone Encounter (Signed)
Second attempt follow up call to pt, lm on vm for pt to only call if having problems.

## 2019-05-22 NOTE — Telephone Encounter (Signed)
Patient returned call states he is feeling well no questions at this time.

## 2019-05-22 NOTE — Telephone Encounter (Signed)
Left message on follow up call. 

## 2019-07-17 ENCOUNTER — Ambulatory Visit (INDEPENDENT_AMBULATORY_CARE_PROVIDER_SITE_OTHER)
Admission: RE | Admit: 2019-07-17 | Discharge: 2019-07-17 | Disposition: A | Discharge status: Home / Self Care | Payer: BC Managed Care – PPO | Source: Ambulatory Visit | Attending: Adult Health | Admitting: Adult Health

## 2019-07-17 ENCOUNTER — Ambulatory Visit (INDEPENDENT_AMBULATORY_CARE_PROVIDER_SITE_OTHER): Payer: BC Managed Care – PPO | Admitting: Adult Health

## 2019-07-17 ENCOUNTER — Encounter: Payer: Self-pay | Admitting: Adult Health

## 2019-07-17 ENCOUNTER — Other Ambulatory Visit: Payer: Self-pay

## 2019-07-17 ENCOUNTER — Telehealth: Payer: Self-pay | Admitting: Internal Medicine

## 2019-07-17 ENCOUNTER — Other Ambulatory Visit: Payer: Self-pay | Admitting: Adult Health

## 2019-07-17 VITALS — BP 110/72 | HR 76 | Temp 98.0°F | Ht 70.0 in | Wt 256.0 lb

## 2019-07-17 DIAGNOSIS — M25562 Pain in left knee: Secondary | ICD-10-CM

## 2019-07-17 DIAGNOSIS — M25571 Pain in right ankle and joints of right foot: Secondary | ICD-10-CM

## 2019-07-17 MED ORDER — NAPROXEN 500 MG PO TABS: 500.0000 mg | ORAL_TABLET | Freq: Two times a day (BID) | ORAL | 0 refills | Status: DC

## 2019-07-17 NOTE — Progress Notes (Addendum)
Subjective:    Patient ID: Jacob Williamson, male    DOB: Mar 04, 1970, 49 y.o.   MRN: [id number]  HPI 49 year old male who  has a past medical history of Asthma, GERD (gastroesophageal reflux disease), Hyperlipidemia, and Obesity (BMI 35.0-39.9 without comorbidity).  He is a patient of Dr. Ardyth Harps who I am seeing today for an acute issue of left  knee pain and right ankle pain.   Patient report he has had right ankle pain for the last month.  Pain is worse with weightbearing and is located along the back of his heel.  Denies aggravating injury or trauma but believes that it came from his steel toe work boots.  Most recently he reports 1 day of left knee pain.  Reports that he was doing knee stretches yesterday evening and had no discomfort but when he woke up this morning it was hard for him to bear weight or bend his knee.  He denies trauma or aggravating injury yesterday.  He has not noticed any redness or swelling.  Pain is worse with weightbearing and denies pain with rest.  He iced his knee this morning which did not provide any relief in discomfort   Review of Systems See HPI   Past Medical History:  Diagnosis Date   Asthma    past hx - not current    GERD (gastroesophageal reflux disease)    Hyperlipidemia    no medications    Obesity (BMI 35.0-39.9 without comorbidity)     Social History   Socioeconomic History   Marital status: Single    Spouse name: Not on file   Number of children: Not on file   Years of education: Not on file   Highest education level: Not on file  Occupational History   Not on file  Tobacco Use   Smoking status: Former Smoker   Smokeless tobacco: Never Used  Substance and Sexual Activity   Alcohol use: Not Currently    Comment: occasional   Drug use: No   Sexual activity: Yes  Other Topics Concern   Not on file  Social History Narrative   Not on file   Social Determinants of Health   Financial Resource Strain:     Difficulty of Paying Living Expenses:   Food Insecurity:    Worried About Programme researcher, broadcasting/film/video in the Last Year:    Barista in the Last Year:   Transportation Needs:    Freight forwarder (Medical):    Lack of Transportation (Non-Medical):   Physical Activity:    Days of Exercise per Week:    Minutes of Exercise per Session:   Stress:    Feeling of Stress :   Social Connections:    Frequency of Communication with Friends and Family:    Frequency of Social Gatherings with Friends and Family:    Attends Religious Services:    Active Member of Clubs or Organizations:    Attends Engineer, structural:    Marital Status:   Intimate Partner Violence:    Fear of Current or Ex-Partner:    Emotionally Abused:    Physically Abused:    Sexually Abused:     Past Surgical History:  Procedure Laterality Date   NO PAST SURGERIES      Family History  Problem Relation Age of Onset   Hypertension Mother    Diabetes Mother    Hypertension Father    Asthma Father    Diabetes  Maternal Grandmother    Colon cancer Neg Hx    Colon polyps Neg Hx    Esophageal cancer Neg Hx    Rectal cancer Neg Hx    Stomach cancer Neg Hx     Allergies  Allergen Reactions   Shellfish Allergy Anaphylaxis, Itching and Swelling   Catfish [Fish Allergy] Itching and Other (See Comments)    Mainly focused on throat and inside mouth, itching    Other     [place] nuts    Current Outpatient Medications on File Prior to Visit  Medication Sig Dispense Refill   cyanocobalamin (,VITAMIN B-12,) 1000 MCG/ML injection Inject 79ml in deltoid once weekly for 4 weeks, then inject 1 ml once a month thereafter 6 mL 1   Multiple Vitamin (MULTIVITAMIN) tablet Take 1 tablet by mouth daily. Alive     SYRINGE-NEEDLE, DISP, 3 ML (BD SAFETYGLIDE SYRINGE/NEEDLE) 25G X 1" 3 ML MISC Use for B12 injections 100 each 11   Vitamin D, Ergocalciferol, (DRISDOL) 1.25 MG (50000 UNIT)  CAPS capsule Take 1 capsule (50,000 Units total) by mouth every 7 (seven) days for 12 doses. 12 capsule 0   No current facility-administered medications on file prior to visit.    BP 110/72    Pulse 76    Temp 98 F (36.7 C)    Ht 5\' 10"  (1.778 m)    Wt 256 lb (116.1 kg)    SpO2 97%    BMI 36.73 kg/m       Objective:   Physical Exam Vitals and nursing note reviewed.  Constitutional:      Appearance: Normal appearance. He is obese.  Musculoskeletal:     Left knee: No swelling, deformity, effusion, erythema, ecchymosis, bony tenderness or crepitus. Normal range of motion. Tenderness present. No LCL laxity, MCL laxity, ACL laxity or PCL laxity.Normal alignment, normal meniscus and normal patellar mobility. Normal pulse.     Instability Tests: Negative medial McMurray test and negative lateral McMurray test.     Right ankle: Normal. No swelling, deformity or ecchymosis. No tenderness. Normal range of motion. Normal pulse.     Right Achilles Tendon: Normal. No tenderness or defects. Thompson's test negative.       Legs:     Comments: Negative anterior and posterior drawer. No discomfort with straight leg raise, knee to chest, or internal.external rotation. No calf pain, redness, or warmth. No bakers cyst  Skin:    General: Skin is warm and dry.  Neurological:     General: No focal deficit present.     Mental Status: He is alert and oriented to person, place, and time.     Gait: Gait abnormal (limping ).  Psychiatric:        Mood and Affect: Mood normal.        Behavior: Behavior normal.        Thought Content: Thought content normal.        Judgment: Judgment normal.       Assessment & Plan:  1. Acute pain of left knee -Pain seem to be more located along the tibialis anterior muscle.  Possible ligament or tendon injury from increased weight distribution due to right ankle pain?  Will get x-ray.  Send in naproxen advise rest, ice, compression, and elevation. Will follow up once  xrays have resulted  - DG Knee 3 Views Left; Future - naproxen (NAPROSYN) 500 MG tablet; Take 1 tablet (500 mg total) by mouth 2 (two) times daily with a meal.  Dispense:  30 tablet; Refill: 0  2. Acute right ankle pain - normal ankle. Will check xray for possible calcification  - DG Ankle Complete Right; Future - naproxen (NAPROSYN) 500 MG tablet; Take 1 tablet (500 mg total) by mouth 2 (two) times daily with a meal.  Dispense: 30 tablet; Refill: 0   Dorothyann Peng, NP   Time spent on time with patient; discussion of orthopedic injuries, home treatment plan, follow up plan, and documentation 30 minutes

## 2019-07-17 NOTE — Patient Instructions (Signed)
I am going to get xrays of your right ankle and left knee.   I have also sent in an antiinflammatory   We will follow up with you once the xrays are back

## 2019-07-17 NOTE — Telephone Encounter (Signed)
The patient is calling back to get X-Ray results from today's visit.  Please advise

## 2019-07-23 NOTE — Telephone Encounter (Signed)
Kandee Keen, since you saw him, do you mind dealing with this? Thanks! :)

## 2019-07-23 NOTE — Telephone Encounter (Signed)
You're the best!

## 2019-07-23 NOTE — Telephone Encounter (Signed)
Patient notified of Xray results on 6/2

## 2019-07-23 NOTE — Telephone Encounter (Signed)
Noted  

## 2019-07-29 ENCOUNTER — Telehealth: Payer: Self-pay | Admitting: Internal Medicine

## 2019-07-29 NOTE — Telephone Encounter (Signed)
Pt would like to speak to someone regarding his coloscopy results. Thanks

## 2019-07-30 NOTE — Telephone Encounter (Signed)
Spoke with patient and reviewed results.

## 2019-07-30 NOTE — Telephone Encounter (Signed)
Pt stated he goes to Lunch at 11am and he can be reached at [phone]. If calling prior to 11 or after 12pm then he can be reached at [phone]

## 2019-08-20 ENCOUNTER — Other Ambulatory Visit: Payer: Self-pay

## 2019-08-20 ENCOUNTER — Emergency Department (HOSPITAL_COMMUNITY): Payer: BC Managed Care – PPO

## 2019-08-20 ENCOUNTER — Emergency Department (HOSPITAL_COMMUNITY)
Admission: EM | Admit: 2019-08-20 | Discharge: 2019-08-20 | Disposition: A | Discharge status: Home / Self Care | Payer: BC Managed Care – PPO | Attending: Emergency Medicine | Admitting: Emergency Medicine

## 2019-08-20 ENCOUNTER — Encounter (HOSPITAL_COMMUNITY): Payer: Self-pay | Admitting: Emergency Medicine

## 2019-08-20 DIAGNOSIS — J45909 Unspecified asthma, uncomplicated: Secondary | ICD-10-CM | POA: Insufficient documentation

## 2019-08-20 DIAGNOSIS — R1084 Generalized abdominal pain: Secondary | ICD-10-CM

## 2019-08-20 DIAGNOSIS — Z87891 Personal history of nicotine dependence: Secondary | ICD-10-CM | POA: Diagnosis not present

## 2019-08-20 DIAGNOSIS — R109 Unspecified abdominal pain: Secondary | ICD-10-CM | POA: Diagnosis not present

## 2019-08-20 DIAGNOSIS — K8689 Other specified diseases of pancreas: Secondary | ICD-10-CM | POA: Diagnosis not present

## 2019-08-20 DIAGNOSIS — I700 Atherosclerosis of aorta: Secondary | ICD-10-CM | POA: Diagnosis not present

## 2019-08-20 DIAGNOSIS — K5730 Diverticulosis of large intestine without perforation or abscess without bleeding: Secondary | ICD-10-CM | POA: Diagnosis not present

## 2019-08-20 DIAGNOSIS — R197 Diarrhea, unspecified: Secondary | ICD-10-CM | POA: Insufficient documentation

## 2019-08-20 LAB — URINALYSIS, ROUTINE W REFLEX MICROSCOPIC
Bilirubin Urine: NEGATIVE
Glucose, UA: NEGATIVE mg/dL
Hgb urine dipstick: NEGATIVE
Ketones, ur: NEGATIVE mg/dL
Leukocytes,Ua: NEGATIVE
Nitrite: NEGATIVE
Protein, ur: NEGATIVE mg/dL
Specific Gravity, Urine: 1.027 (ref 1.005–1.030)
pH: 5 (ref 5.0–8.0)

## 2019-08-20 LAB — COMPREHENSIVE METABOLIC PANEL
ALT: 21 U/L (ref 0–44)
AST: 24 U/L (ref 15–41)
Albumin: 4 g/dL (ref 3.5–5.0)
Alkaline Phosphatase: 56 U/L (ref 38–126)
Anion gap: 10 (ref 5–15)
BUN: 12 mg/dL (ref 6–20)
CO2: 27 mmol/L (ref 22–32)
Calcium: 9.2 mg/dL (ref 8.9–10.3)
Chloride: 101 mmol/L (ref 98–111)
Creatinine, Ser: 1.24 mg/dL (ref 0.61–1.24)
GFR calc Af Amer: >60 mL/min (ref >60)
GFR calc non Af Amer: >60 mL/min (ref >60)
Glucose, Bld: 113 mg/dL — ABNORMAL HIGH (ref 70–99)
Potassium: 4.3 mmol/L (ref 3.5–5.1)
Sodium: 138 mmol/L (ref 135–145)
Total Bilirubin: 0.5 mg/dL (ref 0.3–1.2)
Total Protein: 7.3 g/dL (ref 6.5–8.1)

## 2019-08-20 LAB — CBC
HCT: 48.4 % (ref 39.0–52.0)
Hemoglobin: 15 g/dL (ref 13.0–17.0)
MCH: 25.7 pg — ABNORMAL LOW (ref 26.0–34.0)
MCHC: 31 g/dL (ref 30.0–36.0)
MCV: 83 fL (ref 80.0–100.0)
Platelets: 228 10*3/uL (ref 150–400)
RBC: 5.83 MIL/uL — ABNORMAL HIGH (ref 4.22–5.81)
RDW: 14.4 % (ref 11.5–15.5)
WBC: 5.5 10*3/uL (ref 4.0–10.5)
nRBC: 0 % (ref 0.0–0.2)

## 2019-08-20 LAB — LIPASE, BLOOD: Lipase: 21 U/L (ref 11–51)

## 2019-08-20 MED ORDER — SODIUM CHLORIDE 0.9% FLUSH
3.0000 mL | Freq: Once | INTRAVENOUS | Status: AC
  Administered 2019-08-20: 3 mL via INTRAVENOUS

## 2019-08-20 MED ORDER — IOHEXOL 300 MG/ML  SOLN
100.0000 mL | Freq: Once | INTRAMUSCULAR | Status: AC | PRN
  Administered 2019-08-20: 100 mL via INTRAVENOUS

## 2019-08-20 NOTE — Discharge Instructions (Addendum)
You were seen today for abdominal pain.  Your blood work, urinalysis were all normal.  Your CT scan did not show any acute, emergent process.  It did not show diverticulitis.  You do have a very small 7 mm spot on your pancreas which is likely a benign fatty infiltrate.  However, this still needs to be evaluated by an MRI to further characterize it.  We are going to refer you to the GI specialist for further work-up where they may perform further imaging studies or testing. Thank you for allowing me to care for you today. Please return to the emergency department if you have new or worsening symptoms. Take your medications as instructed.

## 2019-08-20 NOTE — ED Notes (Signed)
Patient transported to CT 

## 2019-08-20 NOTE — ED Provider Notes (Signed)
[facility] EMERGENCY DEPARTMENT Provider Note   CSN: [id number] Arrival date & time: 08/20/19  0848     History Chief Complaint  Patient presents with  . Abdominal Pain    Jacob Williamson is a 49 y.o. male.  Patient is a 49 year old Jacob Williamson with past medical history of asthma, GERD, hyperlipidemia, obesity, diverticula presenting to the emergency department for crampy abdominal pain with diarrhea over the last couple of days.  Patient feels like Jacob Williamson may have diverticulitis.  Jacob Williamson has not had a flareup of diverticulitis before but Jacob Williamson is aware that Jacob Williamson has diverticula that was seen on his last colonoscopy.  Jacob Williamson reports feeling feverish and sweating at nighttime and feeling like his insides are twisting.  Reports that Jacob Williamson had really loose stool this morning which looked black.  Jacob Williamson does not drink alcohol, Jacob Williamson occasionally smokes.  Does not take NSAIDs but Jacob Williamson eats a lot of spicy foods and cayenne pepper.        Past Medical History:  Diagnosis Date  . Asthma    past hx - not current   . GERD (gastroesophageal reflux disease)   . Hyperlipidemia    no medications   . Obesity (BMI 35.0-39.9 without comorbidity)     Patient Active Problem List   Diagnosis Date Noted  . Vitamin D deficiency 05/07/2019  . Vitamin B12 deficiency 05/07/2019  . IGT (impaired glucose tolerance) 05/07/2019  . Hyperlipidemia 05/07/2019  . Obesity (BMI 35.0-39.9 without comorbidity)   . GERD (gastroesophageal reflux disease)     Past Surgical History:  Procedure Laterality Date  . NO PAST SURGERIES         Family History  Problem Relation Age of Onset  . Hypertension Mother   . Diabetes Mother   . Hypertension Father   . Asthma Father   . Diabetes Maternal Grandmother   . Colon cancer Neg Hx   . Colon polyps Neg Hx   . Esophageal cancer Neg Hx   . Rectal cancer Neg Hx   . Stomach cancer Neg Hx     Social History   Tobacco Use  . Smoking status: Former Games developer  .  Smokeless tobacco: Never Used  Vaping Use  . Vaping Use: Never used  Substance Use Topics  . Alcohol use: Not Currently    Comment: occasional  . Drug use: No    Home Medications Prior to Admission medications   Medication Sig Start Date End Date Taking? Authorizing Provider  cyanocobalamin (,VITAMIN B-12,) 1000 MCG/ML injection Inject 79ml in deltoid once weekly for 4 weeks, then inject 1 ml once a month thereafter Patient not taking: Reported on 08/20/2019 05/07/19   Philip Aspen, Limmie Patricia, MD  naproxen (NAPROSYN) 500 MG tablet Take 1 tablet (500 mg total) by mouth 2 (two) times daily with a meal. Patient not taking: Reported on 08/20/2019 07/17/19   Nafziger, Kandee Keen, NP  SYRINGE-NEEDLE, DISP, 3 ML (BD SAFETYGLIDE SYRINGE/NEEDLE) 25G X 1" 3 ML MISC Use for B12 injections 05/07/19   Philip Aspen, Limmie Patricia, MD    Allergies    Shellfish allergy, Catfish [fish allergy], and Other  Review of Systems   Review of Systems  Constitutional: Positive for appetite change, fatigue and fever.  HENT: Negative.   Respiratory: Negative for cough and shortness of breath.   Cardiovascular: Negative for chest pain.  Gastrointestinal: Positive for abdominal pain, diarrhea and nausea. Negative for rectal pain and vomiting.  Genitourinary: Negative for dysuria.  Musculoskeletal: Negative for  back pain.  Skin: Negative for rash and wound.  Allergic/Immunologic: Negative for immunocompromised state.  Neurological: Negative for dizziness and headaches.  All other systems reviewed and are negative.   Physical Exam Updated Vital Signs BP (!) 138/93 (BP Location: Right Arm)   Pulse 79   Temp 98.3 F (36.8 C) (Oral)   Resp 18   Ht 5\' 10"  (1.778 m)   Wt 108.9 kg   SpO2 98%   BMI 34.44 kg/m   Physical Exam Vitals and nursing note reviewed.  Constitutional:      General: Jacob Williamson is not in acute distress.    Appearance: Normal appearance. Jacob Williamson is well-developed. Jacob Williamson is obese. Jacob Williamson is not ill-appearing,  toxic-appearing or diaphoretic.  HENT:     Head: Normocephalic.     Mouth/Throat:     Mouth: Mucous membranes are moist.  Eyes:     Conjunctiva/sclera: Conjunctivae normal.  Cardiovascular:     Rate and Rhythm: Normal rate and regular rhythm.  Pulmonary:     Effort: Pulmonary effort is normal.  Abdominal:     General: There is distension.     Palpations: Abdomen is soft.     Tenderness: There is no abdominal tenderness.  Skin:    General: Skin is dry.  Neurological:     Mental Status: Jacob Williamson is alert.  Psychiatric:        Mood and Affect: Mood normal.     ED Results / Procedures / Treatments   Labs (all labs ordered are listed, but only abnormal results are displayed) Labs Reviewed  COMPREHENSIVE METABOLIC PANEL - Abnormal; Notable for the following components:      Result Value   Glucose, Bld 113 (*)    All other components within normal limits  CBC - Abnormal; Notable for the following components:   RBC 5.83 (*)    MCH 25.7 (*)    All other components within normal limits  LIPASE, BLOOD  URINALYSIS, ROUTINE W REFLEX MICROSCOPIC    EKG None  Radiology CT ABDOMEN PELVIS W CONTRAST  Result Date: 08/20/2019 CLINICAL DATA:  49 year old male with suspected diverticulitis. Epigastric pain for the past 2 days. EXAM: CT ABDOMEN AND PELVIS WITH CONTRAST TECHNIQUE: Multidetector CT imaging of the abdomen and pelvis was performed using the standard protocol following bolus administration of intravenous contrast. CONTRAST:  49 OMNIPAQUE IOHEXOL 300 MG/ML  SOLN COMPARISON:  No priors. FINDINGS: Lower chest: Unremarkable. Hepatobiliary: No suspicious cystic or solid hepatic lesions. No intra or extrahepatic biliary ductal dilatation. Gallbladder is normal in appearance. Pancreas: In the uncinate process of the pancreas there is a 7 mm lesion which is too small to characterize (axial image 34 of series 3), but appears potentially fatty (although there is some central soft tissue). No  other larger more suspicious appearing pancreatic mass. No pancreatic ductal dilatation. No pancreatic or peripancreatic fluid collections or inflammatory changes. Spleen: Unremarkable. Adrenals/Urinary Tract: Bilateral kidneys and adrenal glands are normal in appearance. No hydroureteronephrosis. Urinary bladder is normal in appearance. Stomach/Bowel: The appearance of the stomach is normal. No pathologic dilatation of small bowel or colon. Numerous colonic diverticulae are noted, particularly in the descending colon and proximal sigmoid colon, without surrounding inflammatory changes to suggest an acute diverticulitis at this time. Normal appendix. Vascular/Lymphatic: Aortic atherosclerosis, without evidence of aneurysm or dissection in the abdominal or pelvic vasculature. No lymphadenopathy noted in the abdomen or pelvis. Reproductive: Prostate gland and seminal vesicles are unremarkable in appearance. Other: No significant volume of ascites.  No  pneumoperitoneum. Musculoskeletal: There are no aggressive appearing lytic or blastic lesions noted in the visualized portions of the skeleton. IMPRESSION: 1. No acute findings are noted in the abdomen or pelvis to account for the patient's symptoms. 2. Specifically, although there is mild colonic diverticulosis, there is no evidence to suggest an acute diverticulitis at this time. 3. 7 mm indeterminate lesion in the uncinate process of the pancreas, too small to characterize. Although this is favored to represent some focal fatty invagination into the pancreas (which would be a benign finding), further characterization with nonemergent abdominal MRI with and without IV gadolinium is recommended in the near future to better evaluate this lesion and establish a baseline for future follow-up. 4. Aortic atherosclerosis. Electronically Signed   By: Trudie Reed M.D.   On: 08/20/2019 11:09    Procedures Procedures (including critical care time)  Medications Ordered  in ED Medications  sodium chloride flush (NS) 0.9 % injection 3 mL (3 mLs Intravenous Given 08/20/19 0956)  iohexol (OMNIPAQUE) 300 MG/ML solution 100 mL (100 mLs Intravenous Contrast Given 08/20/19 1048)    ED Course  I have reviewed the triage vital signs and the nursing notes.  Pertinent labs & imaging results that were available during my care of the patient were reviewed by me and considered in my medical decision making (see chart for details).  Clinical Course as of Aug 20 1210  Tue Aug 20, 2019  1211 Patient is seen today for generalized crampy abdominal pain and history of diverticulitis.  Patient concern for diverticulitis.  Work-up was unremarkable including labs, urinalysis and CT scan.  I discussed with the patient all the findings of the CT scan including the 7 mm spot on his pancreas.  Discussed that this is likely benign but that Jacob Williamson needs to have follow-up for an MRI.  Will refer him to GI for further work-up.  Jacob Williamson has had a colonoscopy in the past. Discussed return precautions   [KM]    Clinical Course User Index [KM] Jeral Pinch   MDM Rules/Calculators/A&P                          Based on review of vitals, medical screening exam, lab work and/or imaging, there does not appear to be an acute, emergent etiology for the patient's symptoms. Counseled pt on good return precautions and encouraged both PCP and ED follow-up as needed.  Prior to discharge, I also discussed incidental imaging findings with patient in detail and advised appropriate, recommended follow-up in detail.  Clinical Impression: 1. Generalized abdominal pain     Disposition: Discharge  Prior to providing a prescription for a controlled substance, I independently reviewed the patient's recent prescription history on the [place] Controlled Substance Reporting System. The patient had no recent or regular prescriptions and was deemed appropriate for a brief, less than 3 day prescription of  narcotic for acute analgesia.  This note was prepared with assistance of Conservation officer, historic buildings. Occasional wrong-word or sound-a-like substitutions may have occurred due to the inherent limitations of voice recognition software.  Final Clinical Impression(s) / ED Diagnoses Final diagnoses:  Generalized abdominal pain    Rx / DC Orders ED Discharge Orders    None       Jeral Pinch 08/20/19 1212    Tilden Fossa, MD 08/20/19 1240

## 2019-08-20 NOTE — ED Triage Notes (Signed)
Pt reports abd pain that began Sunday, also endorses an episode of runny, dark stools. Hx of diverticulitis.

## 2019-08-22 ENCOUNTER — Ambulatory Visit (INDEPENDENT_AMBULATORY_CARE_PROVIDER_SITE_OTHER): Payer: BC Managed Care – PPO | Admitting: Internal Medicine

## 2019-08-22 ENCOUNTER — Other Ambulatory Visit: Payer: Self-pay

## 2019-08-22 ENCOUNTER — Encounter: Payer: Self-pay | Admitting: Internal Medicine

## 2019-08-22 VITALS — BP 110/80 | HR 94 | Temp 98.3°F | Wt 245.9 lb

## 2019-08-22 DIAGNOSIS — R739 Hyperglycemia, unspecified: Secondary | ICD-10-CM | POA: Diagnosis not present

## 2019-08-22 DIAGNOSIS — R7302 Impaired glucose tolerance (oral): Secondary | ICD-10-CM | POA: Diagnosis not present

## 2019-08-22 DIAGNOSIS — E559 Vitamin D deficiency, unspecified: Secondary | ICD-10-CM | POA: Diagnosis not present

## 2019-08-22 DIAGNOSIS — K219 Gastro-esophageal reflux disease without esophagitis: Secondary | ICD-10-CM

## 2019-08-22 DIAGNOSIS — Z09 Encounter for follow-up examination after completed treatment for conditions other than malignant neoplasm: Secondary | ICD-10-CM

## 2019-08-22 DIAGNOSIS — E538 Deficiency of other specified B group vitamins: Secondary | ICD-10-CM

## 2019-08-22 DIAGNOSIS — K862 Cyst of pancreas: Secondary | ICD-10-CM

## 2019-08-22 DIAGNOSIS — E669 Obesity, unspecified: Secondary | ICD-10-CM

## 2019-08-22 DIAGNOSIS — R1084 Generalized abdominal pain: Secondary | ICD-10-CM

## 2019-08-22 DIAGNOSIS — E785 Hyperlipidemia, unspecified: Secondary | ICD-10-CM

## 2019-08-22 LAB — POCT GLYCOSYLATED HEMOGLOBIN (HGB A1C): Hemoglobin A1C: 5.8 % — AB (ref 4.0–5.6)

## 2019-08-22 MED ORDER — CYANOCOBALAMIN 1000 MCG/ML IJ SOLN
1000.0000 ug | Freq: Once | INTRAMUSCULAR | Status: AC
  Administered 2019-08-22: 1000 ug via INTRAMUSCULAR

## 2019-08-22 NOTE — Patient Instructions (Signed)
-  Nice seeing you today!!  -First B12 injection today. Then daily for 6 more days, then monthly.  -Fill out prescription for Vit D.  -Schedule a 3 month follow up.

## 2019-08-22 NOTE — Progress Notes (Signed)
Established Patient Office Visit     This visit occurred during the SARS-CoV-2 public health emergency.  Safety protocols were in place, including screening questions prior to the visit, additional usage of staff PPE, and extensive cleaning of exam room while observing appropriate contact time as indicated for disinfecting solutions.    CC/Reason for Visit: ED follow-up, follow-up chronic conditions  HPI: Jacob Williamson is a 49 y.o. male who is coming in today for the above mentioned reasons. Past Medical History is significant for: Hyperlipidemia not on medications, obesity, impaired glucose tolerance, vitamin D and vitamin B12 deficiency.  On 7/6 he visited the emergency department due to severe, generalized cramping abdominal pain with copious amounts of watery diarrhea.  He states the diarrhea was green in color, no blood, no nausea, no vomiting.  No fever.  He had a CT scan that did not show intra-abdominal pathology, however there was a small 7 mm pancreatic mass that the radiologist is requesting MRI follow-up for.  He states he is feeling better but he is still having occasional diarrhea, the ED made an appointment with him to see GI on 7/27.  He has not yet started taking vitamin B12 or vitamin D as instructed during his last visit.   Past Medical/Surgical History: Past Medical History:  Diagnosis Date  . Asthma    past hx - not current   . GERD (gastroesophageal reflux disease)   . Hyperlipidemia    no medications   . Obesity (BMI 35.0-39.9 without comorbidity)     Past Surgical History:  Procedure Laterality Date  . NO PAST SURGERIES      Social History:  reports that he has quit smoking. He has never used smokeless tobacco. He reports previous alcohol use. He reports that he does not use drugs.  Allergies: Allergies  Allergen Reactions  . Shellfish Allergy Anaphylaxis, Itching and Swelling  . Catfish [Fish Allergy] Itching and Other (See Comments)     Mainly focused on throat and inside mouth, itching   . Other     [place] nuts    Family History:  Family History  Problem Relation Age of Onset  . Hypertension Mother   . Diabetes Mother   . Hypertension Father   . Asthma Father   . Diabetes Maternal Grandmother   . Colon cancer Neg Hx   . Colon polyps Neg Hx   . Esophageal cancer Neg Hx   . Rectal cancer Neg Hx   . Stomach cancer Neg Hx      Current Outpatient Medications:  .  cyanocobalamin (,VITAMIN B-12,) 1000 MCG/ML injection, Inject 60ml in deltoid once weekly for 4 weeks, then inject 1 ml once a month thereafter (Patient not taking: Reported on 08/22/2019), Disp: 6 mL, Rfl: 1 .  naproxen (NAPROSYN) 500 MG tablet, Take 1 tablet (500 mg total) by mouth 2 (two) times daily with a meal. (Patient not taking: Reported on 08/22/2019), Disp: 30 tablet, Rfl: 0 .  SYRINGE-NEEDLE, DISP, 3 ML (BD SAFETYGLIDE SYRINGE/NEEDLE) 25G X 1" 3 ML MISC, Use for B12 injections (Patient not taking: Reported on 08/22/2019), Disp: 100 each, Rfl: 11  Review of Systems:  Constitutional: Denies fever, chills, diaphoresis, appetite change and fatigue.  HEENT: Denies photophobia, eye pain, redness, hearing loss, ear pain, congestion, sore throat, rhinorrhea, sneezing, mouth sores, trouble swallowing, neck pain, neck stiffness and tinnitus.   Respiratory: Denies SOB, DOE, cough, chest tightness,  and wheezing.   Cardiovascular: Denies chest pain, palpitations and  leg swelling.  Gastrointestinal: Denies nausea, vomiting, abdominal pain, diarrhea, constipation, blood in stool and abdominal distention.  Genitourinary: Denies dysuria, urgency, frequency, hematuria, flank pain and difficulty urinating.  Endocrine: Denies: hot or cold intolerance, sweats, changes in hair or nails, polyuria, polydipsia. Musculoskeletal: Denies myalgias, back pain, joint swelling, arthralgias and gait problem.  Skin: Denies pallor, rash and wound.  Neurological: Denies dizziness,  seizures, syncope, weakness, light-headedness, numbness and headaches.  Hematological: Denies adenopathy. Easy bruising, personal or family bleeding history  Psychiatric/Behavioral: Denies suicidal ideation, mood changes, confusion, nervousness, sleep disturbance and agitation    Physical Exam: Vitals:   08/22/19 1314  BP: 110/80  Pulse: 94  Temp: 98.3 F (36.8 C)  TempSrc: Temporal  SpO2: 97%  Weight: 245 lb 14.4 oz (111.5 kg)    Body mass index is 35.28 kg/m.   Constitutional: NAD, calm, comfortable, obese Eyes: PERRL, lids and conjunctivae normal, wears corrective lenses ENMT: Mucous membranes are moist.  Respiratory: clear to auscultation bilaterally, no wheezing, no crackles. Normal respiratory effort. No accessory muscle use.  Cardiovascular: Regular rate and rhythm, no murmurs / rubs / gallops. No extremity edema.  Abdomen: no tenderness, no masses palpated. No hepatosplenomegaly. Bowel sounds positive.  Neurologic: Grossly intact and nonfocal Psychiatric: Normal judgment and insight. Alert and oriented x 3. Normal mood.    Impression and Plan:   Vitamin D deficiency -He has not yet started high-dose weekly supplementation was prescribed, he will pick up from pharmacy today. -Follow-up levels in 3 months.  Vitamin B12 deficiency -First IM injection in office today.  IGT (impaired glucose tolerance) -Well-controlled, A1c is 5.8.  Hyperlipidemia, unspecified hyperlipidemia type -Recent LDL was 120, he is attempting lifestyle modifications.  Obesity (BMI 35.0-39.9 without comorbidity) -Discussed healthy lifestyle, including increased physical activity and better food choices to promote weight loss. -He has recently lost 11 pounds. Hospital discharge follow-up  Generalized abdominal pain ED Follow-up -Based on how his symptoms are now resolving, I suspect that this was likely acute viral gastroenteritis. -He has follow-up with GI scheduled on  7/27. -Negative ED work-up is reassuring including CT scan of the abdomen.  Small pancreatic mass -As per radiology recommendations, nonemergent MRI abdomen requested today for follow-up.    Patient Instructions  -Nice seeing you today!!  -First B12 injection today. Then daily for 6 more days, then monthly.  -Fill out prescription for Vit D.  -Schedule a 3 month follow up.     Chaya Jan, MD [facility]

## 2019-08-22 NOTE — Addendum Note (Signed)
Addended by: Kern Reap B on: 08/22/2019 01:52 PM   Modules accepted: Orders

## 2019-09-10 ENCOUNTER — Encounter: Payer: Self-pay | Admitting: Gastroenterology

## 2019-09-10 ENCOUNTER — Ambulatory Visit (INDEPENDENT_AMBULATORY_CARE_PROVIDER_SITE_OTHER): Payer: BC Managed Care – PPO | Admitting: Gastroenterology

## 2019-09-10 VITALS — BP 116/66 | HR 84 | Ht 69.5 in | Wt 247.2 lb

## 2019-09-10 DIAGNOSIS — K862 Cyst of pancreas: Secondary | ICD-10-CM

## 2019-09-10 DIAGNOSIS — R109 Unspecified abdominal pain: Secondary | ICD-10-CM

## 2019-09-10 DIAGNOSIS — R935 Abnormal findings on diagnostic imaging of other abdominal regions, including retroperitoneum: Secondary | ICD-10-CM | POA: Diagnosis not present

## 2019-09-10 NOTE — Progress Notes (Signed)
Referring Provider: Philip Aspen, Almira Bar* Primary Care Physician:  Philip Aspen, Limmie Patricia, MD  Reason for Consultation:  Abdominal pain   IMPRESSION:  Acute abdominal pain x 3 days, now resolved Possible melena with patient concern for PUD 58mm indeterminate lesion in the uncinate process of the pancreas Left-sided diverticulosis on CT and colonoscopy   Acute abdominal pain, although improved, there has been a change in bowel habits with possible melena: CT in the ED as well as liver enzymes and lipase were normal. EGD recommended to evaluate for PUD, esophagitis, gastritis, and H pylori.   14mm indeterminate lesion in the uncinate process of the pancreas: MRI with gadolinium recommended.  Diverticulosis: Reviewed diagnosis. No evidence for diverticulitis during acute symptoms. High fiber diet, daily psyllium or methylcellulose, increase water intake, and avoid NSAIDs recommended.       PLAN: Add a daily stool bulking agent such as Metamucil, Benefiber, or Citrucel High fiber diet, drink at least 64 ounces of water daily Avoid all NSAIDs EGD MRI with and without gadolinium to follow-up on the pancreas cyst  Please see the "Patient Instructions" section for addition details about the plan.  HPI: Jacob Williamson is a 49 y.o. male known to me for a screening colonoscopy in April. He is seen now in ED follow-up. The history is obtained through the patient and review of his electronic health record.  He has hyperlipidemia not on medications, obesity, impaired glucose tolerance, vitamin D and vitamin B12 deficiency.    On 7/6 he visited the emergency department due to severe, generalized cramping abdominal pain with copious amounts of watery diarrhea. Abdominal pain lasted 3 days. Stools were dark and worried him for being black.  No nausea, vomiting, or fever. No blood or mucous in the stool.  Stools studies were not obtained. CBC, LFTs, and lipase were normal. He had a CT  scan that did not show intra-abdominal pathology, however there was a small 7 mm pancreatic cyst for which the radiologist recommended follow-up MRI imaging.    His symptoms have largely resolved without intervention. He is concerned that he has a ulcer. The ER told him that he needs an EGD. The ED scared him given the pancreas findings. He has change his diet to eat more plant based, raw foods.   Naproxen is on his medication list, but, he is not taking it. He denies taking any NSAIDs.   No personal history of family history of pancreatitis.   No known family history of colon cancer or polyps. No family history of uterine/endometrial cancer, pancreatic cancer or gastric/stomach cancer.   Colonoscopy 05/20/19: - Diverticulosis in the sigmoid colon and in the descending colon. - The examination was otherwise normal on direct and retroflexion views. - No specimens collected.  CT 08/20/19:  IMPRESSION: 1. No acute findings are noted in the abdomen or pelvis to account for the patient's symptoms. 2. Specifically, although there is mild colonic diverticulosis, there is no evidence to suggest an acute diverticulitis at this time. 3. 7 mm indeterminate lesion in the uncinate process of the pancreas, too small to characterize. Although this is favored to represent some focal fatty invagination into the pancreas (which would be a benign finding), further characterization with nonemergent abdominal MRI with and without IV gadolinium is recommended in the near future to better evaluate this lesion and establish a baseline for future follow-up. 4. Aortic atherosclerosis.   Past Medical History:  Diagnosis Date  . Asthma    past hx -  not current   . Diverticulosis    sigmoid colon and descending colon  . GERD (gastroesophageal reflux disease)   . Hyperlipidemia    no medications   . Obesity (BMI 35.0-39.9 without comorbidity)     Past Surgical History:  Procedure Laterality Date  .  COLONOSCOPY  05/20/2019    Current Outpatient Medications  Medication Sig Dispense Refill  . cyanocobalamin (,VITAMIN B-12,) 1000 MCG/ML injection Inject 32ml in deltoid once weekly for 4 weeks, then inject 1 ml once a month thereafter (Patient not taking: Reported on 08/22/2019) 6 mL 1  . naproxen (NAPROSYN) 500 MG tablet Take 1 tablet (500 mg total) by mouth 2 (two) times daily with a meal. (Patient not taking: Reported on 08/22/2019) 30 tablet 0  . SYRINGE-NEEDLE, DISP, 3 ML (BD SAFETYGLIDE SYRINGE/NEEDLE) 25G X 1" 3 ML MISC Use for B12 injections (Patient not taking: Reported on 08/22/2019) 100 each 11   No current facility-administered medications for this visit.    Allergies as of 09/10/2019 - Review Complete 08/22/2019  Allergen Reaction Noted  . Shellfish allergy Anaphylaxis, Itching, and Swelling 02/28/2012  . Catfish [fish allergy] Itching and Other (See Comments) 12/12/2011  . Other  12/12/2011    Family History  Problem Relation Age of Onset  . Hypertension Mother   . Diabetes Mother   . Hypertension Father   . Asthma Father   . Diabetes Maternal Grandmother   . Colon cancer Neg Hx   . Colon polyps Neg Hx   . Esophageal cancer Neg Hx   . Rectal cancer Neg Hx   . Stomach cancer Neg Hx     Social History   Socioeconomic History  . Marital status: Single    Spouse name: Not on file  . Number of children: Not on file  . Years of education: Not on file  . Highest education level: Not on file  Occupational History  . Not on file  Tobacco Use  . Smoking status: Former Games developer  . Smokeless tobacco: Never Used  Vaping Use  . Vaping Use: Never used  Substance and Sexual Activity  . Alcohol use: Not Currently    Comment: occasional  . Drug use: No  . Sexual activity: Yes  Other Topics Concern  . Not on file  Social History Narrative  . Not on file   Social Determinants of Health   Financial Resource Strain:   . Difficulty of Paying Living Expenses:   Food  Insecurity:   . Worried About Programme researcher, broadcasting/film/video in the Last Year:   . Barista in the Last Year:   Transportation Needs:   . Freight forwarder (Medical):   Marland Kitchen Lack of Transportation (Non-Medical):   Physical Activity:   . Days of Exercise per Week:   . Minutes of Exercise per Session:   Stress:   . Feeling of Stress :   Social Connections:   . Frequency of Communication with Friends and Family:   . Frequency of Social Gatherings with Friends and Family:   . Attends Religious Services:   . Active Member of Clubs or Organizations:   . Attends Banker Meetings:   Marland Kitchen Marital Status:   Intimate Partner Violence:   . Fear of Current or Ex-Partner:   . Emotionally Abused:   Marland Kitchen Physically Abused:   . Sexually Abused:     Review of Systems: 12 system ROS is negative except as noted above.   Physical Exam: General:  Alert,  well-nourished, pleasant and cooperative in NAD Head:  Normocephalic and atraumatic. Eyes:  Sclera clear, no icterus.   Conjunctiva pink. Ears:  Normal auditory acuity. Nose:  No deformity, discharge,  or lesions. Mouth:  No deformity or lesions.   Neck:  Supple; no masses or thyromegaly. Lungs:  Clear throughout to auscultation.   No wheezes. Heart:  Regular rate and rhythm; no murmurs. Abdomen:  Soft,nontender, nondistended, normal bowel sounds, no rebound or guarding. No hepatosplenomegaly.   Rectal:  Deferred  Msk:  Symmetrical. No boney deformities LAD: No inguinal or umbilical LAD Extremities:  No clubbing or edema. Neurologic:  Alert and  oriented x4;  grossly nonfocal Skin:  Intact without significant lesions or rashes. Psych:  Alert and cooperative. Normal mood and affect.    Bryen Hinderman L. Orvan Falconer, MD, MPH 09/10/2019, 9:06 AM

## 2019-09-10 NOTE — Patient Instructions (Addendum)
-   Follow a high fiber diet. Congratulations on increasing the fresh fruits and vegetables in your diet. - Drink at least 64 ounces of water daily.  - Add a daily dose of Metamucil, Citrucel, or Benefiber. Increase to twice daily after one week if your stools are not softer and bulkier.  - There is no need to avoid seeds, nuts, corn, and berries with diverticulosis. - Non-steroidal antiinflammatory medications (such as ibuprofen, naproxen, etc) may increase your risk for a recurrent of diverticulitis. Please avoid these medications whenever possible. - I have recommended an upper endoscopy to evaluate for ulcers. We can schedule this at your convenience.  - I have recommended an MRI to follow-up on the small lesion seen on the pancreas on CT scan.     You have been scheduled for an endoscopy. Please follow written instructions given to you at your visit today. If you use inhalers (even only as needed), please bring them with you on the day of your procedure.  You have been scheduled for an MRI at [facility] on 09/18/2019. Your appointment time is 5:00pm. Please arrive 30 minutes prior to your appointment time for registration purposes. Please make certain not to have anything to eat or drink 4 hours prior to your test. In addition, if you have any metal in your body, have a pacemaker or defibrillator, please be sure to let your ordering physician know. This test typically takes 45 minutes to 1 hour to complete. Should you need to reschedule, please call [phone] to do so.   I appreciate the opportunity to care for you. Tressia Danas, MD, MPH

## 2019-09-18 ENCOUNTER — Other Ambulatory Visit: Payer: Self-pay

## 2019-09-18 ENCOUNTER — Ambulatory Visit (HOSPITAL_COMMUNITY)
Admission: RE | Admit: 2019-09-18 | Discharge: 2019-09-18 | Disposition: A | Discharge status: Home / Self Care | Payer: BC Managed Care – PPO | Source: Ambulatory Visit | Attending: Gastroenterology | Admitting: Gastroenterology

## 2019-09-18 ENCOUNTER — Other Ambulatory Visit: Payer: Self-pay | Admitting: Gastroenterology

## 2019-09-18 DIAGNOSIS — K862 Cyst of pancreas: Secondary | ICD-10-CM

## 2019-09-18 DIAGNOSIS — N281 Cyst of kidney, acquired: Secondary | ICD-10-CM | POA: Diagnosis not present

## 2019-09-18 MED ORDER — GADOBUTROL 1 MMOL/ML IV SOLN
10.0000 mL | Freq: Once | INTRAVENOUS | Status: AC | PRN
  Administered 2019-09-18: 10 mL via INTRAVENOUS

## 2019-09-24 ENCOUNTER — Telehealth: Payer: Self-pay | Admitting: Gastroenterology

## 2019-09-24 NOTE — Telephone Encounter (Signed)
See result note.  

## 2019-10-04 ENCOUNTER — Telehealth: Payer: Self-pay | Admitting: *Deleted

## 2019-10-04 NOTE — Telephone Encounter (Signed)
Thank you :)

## 2019-10-04 NOTE — Telephone Encounter (Signed)
Pt no show for covid test.  LM on VM for patient to call back to reschedule procedure.

## 2019-10-07 ENCOUNTER — Encounter: Payer: BC Managed Care – PPO | Admitting: Gastroenterology

## 2019-11-18 ENCOUNTER — Telehealth: Payer: Self-pay | Admitting: *Deleted

## 2019-11-18 NOTE — Telephone Encounter (Signed)
Attempted to call the patient back but got his voicemail. Per Dr. Orvan Falconer and policy since the patient No showed for his COVID test he will be canceled. Detailed message regarding this was left. Will notify Dr. Orvan Falconer.

## 2019-11-18 NOTE — Telephone Encounter (Signed)
Thank you for the update!

## 2019-11-19 ENCOUNTER — Encounter: Payer: BC Managed Care – PPO | Admitting: Gastroenterology

## 2019-12-04 ENCOUNTER — Encounter: Payer: Self-pay | Admitting: Internal Medicine

## 2019-12-04 ENCOUNTER — Ambulatory Visit (INDEPENDENT_AMBULATORY_CARE_PROVIDER_SITE_OTHER): Payer: BC Managed Care – PPO | Admitting: Internal Medicine

## 2019-12-04 ENCOUNTER — Other Ambulatory Visit: Payer: Self-pay

## 2019-12-04 VITALS — BP 110/70 | HR 63 | Temp 97.9°F | Wt 244.4 lb

## 2019-12-04 DIAGNOSIS — M549 Dorsalgia, unspecified: Secondary | ICD-10-CM | POA: Diagnosis not present

## 2019-12-04 LAB — POCT URINALYSIS DIPSTICK
Bilirubin, UA: NEGATIVE
Blood, UA: NEGATIVE
Glucose, UA: NEGATIVE
Ketones, UA: NEGATIVE
Leukocytes, UA: NEGATIVE
Nitrite, UA: NEGATIVE
Protein, UA: NEGATIVE
Spec Grav, UA: 1.025 (ref 1.010–1.025)
Urobilinogen, UA: 0.2 E.U./dL
pH, UA: 6 (ref 5.0–8.0)

## 2019-12-04 MED ORDER — CYCLOBENZAPRINE HCL 5 MG PO TABS: 5.0000 mg | ORAL_TABLET | Freq: Every evening | ORAL | 0 refills | Status: DC | PRN

## 2019-12-04 NOTE — Patient Instructions (Signed)
-  Nice seeing you today!!  -Apply ice 3 times a day for 15 minutes to your back.  -Ibuprofen 2 tablets three times a day for the next 5 days as needed.  -Flexeril 5 mg at bedtime as needed for back pain.  -do back stretches daily.  -Call if no better in 10-14 days.

## 2019-12-04 NOTE — Progress Notes (Signed)
Acute office Visit     This visit occurred during the SARS-CoV-2 public health emergency.  Safety protocols were in place, including screening questions prior to the visit, additional usage of staff PPE, and extensive cleaning of exam room while observing appropriate contact time as indicated for disinfecting solutions.    CC/Reason for Visit: Acute onset right back pain  HPI: Jacob Williamson is a 49 y.o. male who is coming in today for the above mentioned reasons.  He is seen as a work in for acute back pain.  He woke up this morning with severe pain in his right middle back.  He works at a [employer] and is constantly lifting heavy objects.  He has not had any fever, nausea, vomiting, or any urinary symptoms.  Pain increases with twisting movements of his torso.   Past Medical/Surgical History: Past Medical History:  Diagnosis Date  . Asthma    past hx - not current   . Diverticulosis    sigmoid colon and descending colon  . GERD (gastroesophageal reflux disease)   . Hyperlipidemia    no medications   . Obesity (BMI 35.0-39.9 without comorbidity)     Past Surgical History:  Procedure Laterality Date  . COLONOSCOPY  05/20/2019    Social History:  reports that he has quit smoking. He has never used smokeless tobacco. He reports current alcohol use. He reports that he does not use drugs.  Allergies: Allergies  Allergen Reactions  . Shellfish Allergy Anaphylaxis, Itching and Swelling  . Catfish [Fish Allergy] Itching and Other (See Comments)    Mainly focused on throat and inside mouth, itching   . Other     [place] nuts    Family History:  Family History  Problem Relation Age of Onset  . Hypertension Mother   . Diabetes Mother   . Hypertension Father   . Asthma Father   . Diabetes Maternal Grandmother   . Colon cancer Neg Hx   . Colon polyps Neg Hx   . Esophageal cancer Neg Hx   . Rectal cancer Neg Hx   . Stomach cancer Neg Hx      Current  Outpatient Medications:  .  BLACK CURRANT SEED OIL PO, Take by mouth daily., Disp: , Rfl:  .  cyanocobalamin (,VITAMIN B-12,) 1000 MCG/ML injection, Inject 48ml in deltoid once weekly for 4 weeks, then inject 1 ml once a month thereafter, Disp: 6 mL, Rfl: 1 .  Multiple Vitamin (MULTIVITAMIN) tablet, Take 1 tablet by mouth daily., Disp: , Rfl:  .  SYRINGE-NEEDLE, DISP, 3 ML (BD SAFETYGLIDE SYRINGE/NEEDLE) 25G X 1" 3 ML MISC, Use for B12 injections, Disp: 100 each, Rfl: 11 .  Vitamin D, Ergocalciferol, (DRISDOL) 1.25 MG (50000 UNIT) CAPS capsule, Take 50,000 Units by mouth once a week., Disp: , Rfl:  .  cyclobenzaprine (FLEXERIL) 5 MG tablet, Take 1 tablet (5 mg total) by mouth at bedtime as needed for muscle spasms., Disp: 20 tablet, Rfl: 0  Review of Systems:  Constitutional: Denies fever, chills, diaphoresis, appetite change and fatigue.  HEENT: Denies photophobia, eye pain, redness, hearing loss, ear pain, congestion, sore throat, rhinorrhea, sneezing, mouth sores, trouble swallowing, neck pain, neck stiffness and tinnitus.   Respiratory: Denies SOB, DOE, cough, chest tightness,  and wheezing.   Cardiovascular: Denies chest pain, palpitations and leg swelling.  Gastrointestinal: Denies nausea, vomiting, abdominal pain, diarrhea, constipation, blood in stool and abdominal distention.  Genitourinary: Denies dysuria, urgency, frequency, hematuria, flank pain  and difficulty urinating.  Endocrine: Denies: hot or cold intolerance, sweats, changes in hair or nails, polyuria, polydipsia. Musculoskeletal: Denies joint swelling, arthralgias and gait problem.  Skin: Denies pallor, rash and wound.  Neurological: Denies dizziness, seizures, syncope, weakness, light-headedness, numbness and headaches.  Hematological: Denies adenopathy. Easy bruising, personal or family bleeding history  Psychiatric/Behavioral: Denies suicidal ideation, mood changes, confusion, nervousness, sleep disturbance and  agitation    Physical Exam: Vitals:   12/04/19 0904  BP: 110/70  Pulse: 63  Temp: 97.9 F (36.6 C)  TempSrc: Oral  SpO2: 98%  Weight: 244 lb 6.4 oz (110.9 kg)    Body mass index is 35.57 kg/m.   Constitutional: NAD, calm, comfortable Eyes: PERRL, lids and conjunctivae normal, wears corrective lenses ENMT: Mucous membranes are moist. Musculoskeletal: no clubbing / cyanosis. No joint deformity upper and lower extremities. Good ROM, no contractures. Normal muscle tone.  No back deformity, slight pain to palpation on right mid back. Neurologic: Grossly intact and nonfocal Psychiatric: Normal judgment and insight. Alert and oriented x 3. Normal mood.    Impression and Plan:  Upper back pain on right side -Urine dipstick is negative for signs of infection. -Given his work, I suspect this is a muscular strain. -Have advised icing, as needed NSAIDs, as needed Flexeril at bedtime as well as back stretches and local massage therapy. -He will contact me if no improvement in 10 to 14 days.   Patient Instructions  -Nice seeing you today!!  -Apply ice 3 times a day for 15 minutes to your back.  -Ibuprofen 2 tablets three times a day for the next 5 days as needed.  -Flexeril 5 mg at bedtime as needed for back pain.  -do back stretches daily.  -Call if no better in 10-14 days.     Chaya Jan, MD [facility] at [facility]

## 2019-12-06 ENCOUNTER — Ambulatory Visit: Payer: BC Managed Care – PPO | Admitting: Family Medicine

## 2019-12-10 ENCOUNTER — Telehealth: Payer: Self-pay | Admitting: Internal Medicine

## 2019-12-10 NOTE — Telephone Encounter (Signed)
Pt is calling in stating that he needs a return to work note from the time that he was seen to today (12/10/2019) and would like to have it faxed to the Attn: Deirdre Pippins [facility] [employer])  [phone]) (pts supervisor).  If you are not able to fax it pt state that he can pick it up from the office.

## 2019-12-10 NOTE — Telephone Encounter (Signed)
Yes

## 2019-12-10 NOTE — Telephone Encounter (Signed)
Okay to give note 

## 2019-12-10 NOTE — Telephone Encounter (Signed)
Attempted to fax x 2 but no answer. Left message on machine for patient to pick up letter.

## 2019-12-11 ENCOUNTER — Telehealth: Payer: Self-pay

## 2019-12-11 NOTE — Telephone Encounter (Signed)
Left message for patient to call back to schedule NP appt with Dr. Katrinka Blazing.

## 2019-12-11 NOTE — Telephone Encounter (Signed)
Appt made for 11/4

## 2019-12-18 ENCOUNTER — Ambulatory Visit (INDEPENDENT_AMBULATORY_CARE_PROVIDER_SITE_OTHER): Payer: BC Managed Care – PPO | Admitting: Internal Medicine

## 2019-12-18 ENCOUNTER — Encounter: Payer: Self-pay | Admitting: Internal Medicine

## 2019-12-18 ENCOUNTER — Other Ambulatory Visit: Payer: Self-pay

## 2019-12-18 VITALS — BP 110/80 | HR 80 | Temp 98.3°F | Wt 247.7 lb

## 2019-12-18 DIAGNOSIS — E559 Vitamin D deficiency, unspecified: Secondary | ICD-10-CM

## 2019-12-18 DIAGNOSIS — E785 Hyperlipidemia, unspecified: Secondary | ICD-10-CM

## 2019-12-18 DIAGNOSIS — R7302 Impaired glucose tolerance (oral): Secondary | ICD-10-CM | POA: Diagnosis not present

## 2019-12-18 DIAGNOSIS — E538 Deficiency of other specified B group vitamins: Secondary | ICD-10-CM | POA: Diagnosis not present

## 2019-12-18 DIAGNOSIS — M549 Dorsalgia, unspecified: Secondary | ICD-10-CM | POA: Diagnosis not present

## 2019-12-18 DIAGNOSIS — E669 Obesity, unspecified: Secondary | ICD-10-CM

## 2019-12-18 MED ORDER — CYCLOBENZAPRINE HCL 5 MG PO TABS: 5.0000 mg | ORAL_TABLET | Freq: Every evening | ORAL | 0 refills | Status: DC | PRN

## 2019-12-18 NOTE — Patient Instructions (Signed)
-  Nice seeing you today!!  -Make sure you are taking your Vit D and B12.  -Schedule follow up in 3 months. Come in fasting for repeat labs.

## 2019-12-18 NOTE — Progress Notes (Signed)
Established Patient Office Visit     This visit occurred during the SARS-CoV-2 public health emergency.  Safety protocols were in place, including screening questions prior to the visit, additional usage of staff PPE, and extensive cleaning of exam room while observing appropriate contact time as indicated for disinfecting solutions.    CC/Reason for Visit: Follow-up back pain  HPI: Jacob Williamson is a 49 y.o. male who is coming in today for the above mentioned reasons. Past Medical History is significant for: Hyperlipidemia not on medications, obesity, impaired glucose tolerance as well as vitamin D and B12 deficiency.  He has not been compliant with his vitamin D and vitamin B12 supplementation.  His back pain has improved but is still not completely resolved since her last visit about 3 weeks ago.  He has been icing, taking OTC NSAIDs as well as Flexeril.  He has been taking a muscle relaxer in the mornings and feels like he is in a brain fog.  A friend thought that he would benefit from physical therapy and he scheduled an appointment for tomorrow.   Past Medical/Surgical History: Past Medical History:  Diagnosis Date  . Asthma    past hx - not current   . Diverticulosis    sigmoid colon and descending colon  . GERD (gastroesophageal reflux disease)   . Hyperlipidemia    no medications   . Obesity (BMI 35.0-39.9 without comorbidity)     Past Surgical History:  Procedure Laterality Date  . COLONOSCOPY  05/20/2019    Social History:  reports that he has quit smoking. He has never used smokeless tobacco. He reports current alcohol use. He reports that he does not use drugs.  Allergies: Allergies  Allergen Reactions  . Shellfish Allergy Anaphylaxis, Itching and Swelling  . Catfish [Fish Allergy] Itching and Other (See Comments)    Mainly focused on throat and inside mouth, itching   . Other     [place] nuts    Family History:  Family History  Problem  Relation Age of Onset  . Hypertension Mother   . Diabetes Mother   . Hypertension Father   . Asthma Father   . Diabetes Maternal Grandmother   . Colon cancer Neg Hx   . Colon polyps Neg Hx   . Esophageal cancer Neg Hx   . Rectal cancer Neg Hx   . Stomach cancer Neg Hx      Current Outpatient Medications:  .  BLACK CURRANT SEED OIL PO, Take by mouth daily., Disp: , Rfl:  .  cyanocobalamin (,VITAMIN B-12,) 1000 MCG/ML injection, Inject 37ml in deltoid once weekly for 4 weeks, then inject 1 ml once a month thereafter, Disp: 6 mL, Rfl: 1 .  cyclobenzaprine (FLEXERIL) 5 MG tablet, Take 1 tablet (5 mg total) by mouth at bedtime as needed for muscle spasms., Disp: 20 tablet, Rfl: 0 .  Multiple Vitamin (MULTIVITAMIN) tablet, Take 1 tablet by mouth daily., Disp: , Rfl:  .  SYRINGE-NEEDLE, DISP, 3 ML (BD SAFETYGLIDE SYRINGE/NEEDLE) 25G X 1" 3 ML MISC, Use for B12 injections, Disp: 100 each, Rfl: 11 .  Vitamin D, Ergocalciferol, (DRISDOL) 1.25 MG (50000 UNIT) CAPS capsule, Take 50,000 Units by mouth once a week., Disp: , Rfl:   Review of Systems:  Constitutional: Denies fever, chills, diaphoresis, appetite change and fatigue.  HEENT: Denies photophobia, eye pain, redness, hearing loss, ear pain, congestion, sore throat, rhinorrhea, sneezing, mouth sores, trouble swallowing, neck pain, neck stiffness and tinnitus.  Respiratory: Denies SOB, DOE, cough, chest tightness,  and wheezing.   Cardiovascular: Denies chest pain, palpitations and leg swelling.  Gastrointestinal: Denies nausea, vomiting, abdominal pain, diarrhea, constipation, blood in stool and abdominal distention.  Genitourinary: Denies dysuria, urgency, frequency, hematuria, flank pain and difficulty urinating.  Endocrine: Denies: hot or cold intolerance, sweats, changes in hair or nails, polyuria, polydipsia. Musculoskeletal: Denies  arthralgias and gait problem.  Skin: Denies pallor, rash and wound.  Neurological: Denies dizziness,  seizures, syncope, weakness, light-headedness, numbness and headaches.  Hematological: Denies adenopathy. Easy bruising, personal or family bleeding history  Psychiatric/Behavioral: Denies suicidal ideation, mood changes, confusion, nervousness, sleep disturbance and agitation    Physical Exam: Vitals:   12/18/19 0713  BP: 110/80  Pulse: 80  Temp: 98.3 F (36.8 C)  TempSrc: Oral  SpO2: 96%  Weight: 247 lb 11.2 oz (112.4 kg)    Body mass index is 36.05 kg/m.   Constitutional: NAD, calm, comfortable, obese Eyes: PERRL, lids and conjunctivae normal, wears corrective lenses ENMT: Mucous membranes are moist.  Musculoskeletal: no clubbing / cyanosis. No joint deformity upper and lower extremities. Good ROM, no contractures. Normal muscle tone.  Neurologic: Grossly intact and nonfocal Psychiatric: Normal judgment and insight. Alert and oriented x 3. Normal mood.    Impression and Plan:  Upper back pain on right side  -Have advised continued icing, NSAIDs, muscle relaxers to be taken at nighttime only. -Agree with physical therapy appointment that he has scheduled for tomorrow. -Have advised that vitamin D and B12 deficiency can contribute to joint and muscle pains. -We have also discussed how being obese plays into continued back pain. -He has been advised to use back brace minimum heavy items at work.  Vitamin D deficiency -He will start 12 weeks of high-dose supplementation.  Vitamin B12 deficiency -He will start IM supplementation.  IGT (impaired glucose tolerance) -Last A1c was 5.8 in July.  Hyperlipidemia, unspecified hyperlipidemia type -Recheck lipids when he returns fasting.  Obesity (BMI 35.0-39.9 without comorbidity) -Discussed healthy lifestyle, including increased physical activity and better food choices to promote weight loss. -We have discussed how this contributes to his chronic back pain.    Patient Instructions  -Nice seeing you today!!  -Make  sure you are taking your Vit D and B12.  -Schedule follow up in 3 months. Come in fasting for repeat labs.     Chaya Jan, MD [facility] at [facility]

## 2019-12-19 ENCOUNTER — Encounter: Payer: Self-pay | Admitting: Family Medicine

## 2019-12-19 ENCOUNTER — Ambulatory Visit (INDEPENDENT_AMBULATORY_CARE_PROVIDER_SITE_OTHER): Payer: BC Managed Care – PPO

## 2019-12-19 ENCOUNTER — Other Ambulatory Visit: Payer: Self-pay

## 2019-12-19 ENCOUNTER — Ambulatory Visit (INDEPENDENT_AMBULATORY_CARE_PROVIDER_SITE_OTHER): Payer: BC Managed Care – PPO | Admitting: Family Medicine

## 2019-12-19 VITALS — BP 128/84 | HR 85 | Ht 69.5 in | Wt 246.0 lb

## 2019-12-19 DIAGNOSIS — M545 Low back pain, unspecified: Secondary | ICD-10-CM

## 2019-12-19 DIAGNOSIS — G8929 Other chronic pain: Secondary | ICD-10-CM

## 2019-12-19 DIAGNOSIS — M546 Pain in thoracic spine: Secondary | ICD-10-CM | POA: Insufficient documentation

## 2019-12-19 MED ORDER — MELOXICAM 7.5 MG PO TABS
7.5000 mg | ORAL_TABLET | Freq: Every day | ORAL | 0 refills | Status: DC
Start: 2019-12-19 — End: 2020-04-30

## 2019-12-19 NOTE — Patient Instructions (Signed)
Glad you are doing better Try ice and pennsaid Meloxicam daily for 10 days if it doesn't resolve. Then as needed after. Do not use NSAIDS such as Advil or Aleve when taking Meloxicam It is ok to use Tylenol for additional pain relief Xray today Exercise 3 times a week See me again in 4 weeks

## 2019-12-19 NOTE — Assessment & Plan Note (Signed)
Acute right-sided back pain.  Patient seems to be more in the T10-T12 area.  We discussed with patient at this time he would like to do just some very mild anti-inflammatory for 10 days.  Discussed icing regimen, given home exercises.  X-rays pending.  Should do relatively well to conservative therapy though.  Patient will follow up with me again 4 weeks.  If having worsening pain will consider the possibility of physical therapy and even consider the possibility of osteopathic manipulation.

## 2019-12-19 NOTE — Progress Notes (Signed)
[facility] [address] [place] [address] Phone: [phone] Subjective:   Jacob Williamson, am serving as a scribe for Dr. Antoine Primas. This visit occurred during the SARS-CoV-2 public health emergency.  Safety protocols were in place, including screening questions prior to the visit, additional usage of staff PPE, and extensive cleaning of exam room while observing appropriate contact time as indicated for disinfecting solutions.   I'm seeing this patient by the request  of:  Philip Aspen, Limmie Patricia, MD  CC: Thoracic back pain  IOM:[id number]  Jacob Williamson is a 49 y.o. male coming in with complaint of upper back and neck pain. Patient states that he has had pain thoracic spine for 2 weeks. Pain is more on left than right side. Has been using back brace and Tylenol and Flexeril. Patient wakes up with pain at night. Pain has been improving over 2 weeks.  Patient states initially did not catch his breath.  Seems to have been improving.  Rotation and side bending to the right states pain as well.  Denies any true shortness of breath though.      Past Medical History:  Diagnosis Date  . Asthma    past hx - not current   . Diverticulosis    sigmoid colon and descending colon  . GERD (gastroesophageal reflux disease)   . Hyperlipidemia    no medications   . Obesity (BMI 35.0-39.9 without comorbidity)    Past Surgical History:  Procedure Laterality Date  . COLONOSCOPY  05/20/2019   Social History   Socioeconomic History  . Marital status: Single    Spouse name: Not on file  . Number of children: Not on file  . Years of education: Not on file  . Highest education level: Not on file  Occupational History  . Not on file  Tobacco Use  . Smoking status: Former Games developer  . Smokeless tobacco: Never Used  Vaping Use  . Vaping Use: Never used  Substance and Sexual Activity  . Alcohol use: Yes    Comment: rarely  . Drug use: No  .  Sexual activity: Yes  Other Topics Concern  . Not on file  Social History Narrative  . Not on file   Social Determinants of Health   Financial Resource Strain:   . Difficulty of Paying Living Expenses: Not on file  Food Insecurity:   . Worried About Programme researcher, broadcasting/film/video in the Last Year: Not on file  . Ran Out of Food in the Last Year: Not on file  Transportation Needs:   . Lack of Transportation (Medical): Not on file  . Lack of Transportation (Non-Medical): Not on file  Physical Activity:   . Days of Exercise per Week: Not on file  . Minutes of Exercise per Session: Not on file  Stress:   . Feeling of Stress : Not on file  Social Connections:   . Frequency of Communication with Friends and Family: Not on file  . Frequency of Social Gatherings with Friends and Family: Not on file  . Attends Religious Services: Not on file  . Active Member of Clubs or Organizations: Not on file  . Attends Banker Meetings: Not on file  . Marital Status: Not on file   Allergies  Allergen Reactions  . Shellfish Allergy Anaphylaxis, Itching and Swelling  . Catfish [Fish Allergy] Itching and Other (See Comments)    Mainly focused on throat and inside mouth, itching   .  Other     [place] nuts   Family History  Problem Relation Age of Onset  . Hypertension Mother   . Diabetes Mother   . Hypertension Father   . Asthma Father   . Diabetes Maternal Grandmother   . Colon cancer Neg Hx   . Colon polyps Neg Hx   . Esophageal cancer Neg Hx   . Rectal cancer Neg Hx   . Stomach cancer Neg Hx        Current Outpatient Medications (Analgesics):  .  meloxicam (MOBIC) 7.5 MG tablet, Take 1 tablet (7.5 mg total) by mouth daily.  Current Outpatient Medications (Hematological):  .  cyanocobalamin (,VITAMIN B-12,) 1000 MCG/ML injection, Inject 63ml in deltoid once weekly for 4 weeks, then inject 1 ml once a month thereafter  Current Outpatient Medications (Other):  Marland Kitchen  BLACK CURRANT  SEED OIL PO, Take by mouth daily. .  cyclobenzaprine (FLEXERIL) 5 MG tablet, Take 1 tablet (5 mg total) by mouth at bedtime as needed for muscle spasms. .  Multiple Vitamin (MULTIVITAMIN) tablet, Take 1 tablet by mouth daily. .  SYRINGE-NEEDLE, DISP, 3 ML (BD SAFETYGLIDE SYRINGE/NEEDLE) 25G X 1" 3 ML MISC, Use for B12 injections .  Vitamin D, Ergocalciferol, (DRISDOL) 1.25 MG (50000 UNIT) CAPS capsule, Take 50,000 Units by mouth once a week.   Reviewed prior external information including notes and imaging from  primary care provider As well as notes that were available from care everywhere and other healthcare systems.  Past medical history, social, surgical and family history all reviewed in electronic medical record.  No pertanent information unless stated regarding to the chief complaint.   Review of Systems:  No headache, visual changes, nausea, vomiting, diarrhea, constipation, dizziness, abdominal pain, skin rash, fevers, chills, night sweats, weight loss, swollen lymph nodes, body aches, joint swelling, chest pain, shortness of breath, mood changes.  Denies any dysuria or hematuria POSITIVE muscle aches  Objective  Blood pressure 128/84, pulse 85, height 5' 9.5" (1.765 m), weight 246 lb (111.6 kg), SpO2 97 %.   General: No apparent distress alert and oriented x3 mood and affect normal, dressed appropriately.  HEENT: Pupils equal, extraocular movements intact  Respiratory: Patient's speak in full sentences and does not appear short of breath  Cardiovascular: No lower extremity edema, non tender, no erythema  Gait normal with good balance and coordination.  MSK: Patient thoracic spine has no sign of scoliosis.  Patient is tender to palpation in the parascapular region right greater than left.  Patient does have more pain over the T10 T12 area in the paraspinal musculature with no spinous process tenderness.  Neurovascularly intact distally.  Mild increase in discomfort in the area with  right-sided rotation and side bending.  No CVA tenderness  [id number]; 15 additional minutes spent for Therapeutic exercises as stated in above notes.  This included exercises focusing on stretching, strengthening, with significant focus on eccentric aspects.   Long term goals include an improvement in range of motion, strength, endurance as well as avoiding reinjury. Patient's frequency would include in 1-2 times a day, 3-5 times a week for a duration of 6-12 weeks.  Shoulder Exercises that included:  Basic scapular stabilization to include adduction and depression of scapula Scaption, focusing on proper movement and good control Internal and External rotation utilizing a theraband, with elbow tucked at side entire time Rows with theraband  Proper technique shown and discussed handout in great detail with ATC.  All questions were discussed and answered.  Impression and Recommendations:     The above documentation has been reviewed and is accurate and complete Lyndal Pulley, DO

## 2020-01-08 ENCOUNTER — Ambulatory Visit (INDEPENDENT_AMBULATORY_CARE_PROVIDER_SITE_OTHER): Payer: BC Managed Care – PPO | Admitting: Family Medicine

## 2020-01-08 ENCOUNTER — Other Ambulatory Visit: Payer: Self-pay

## 2020-01-08 ENCOUNTER — Encounter: Payer: Self-pay | Admitting: Family Medicine

## 2020-01-08 VITALS — BP 124/90 | HR 76 | Temp 98.0°F | Wt 247.8 lb

## 2020-01-08 DIAGNOSIS — B372 Candidiasis of skin and nail: Secondary | ICD-10-CM | POA: Diagnosis not present

## 2020-01-08 DIAGNOSIS — B351 Tinea unguium: Secondary | ICD-10-CM

## 2020-01-08 DIAGNOSIS — B353 Tinea pedis: Secondary | ICD-10-CM | POA: Diagnosis not present

## 2020-01-08 MED ORDER — NYSTATIN 100000 UNIT/GM EX CREA: 1.0000 "application " | TOPICAL_CREAM | Freq: Two times a day (BID) | CUTANEOUS | 0 refills | Status: DC

## 2020-01-08 NOTE — Progress Notes (Signed)
Subjective:    Patient ID: Jacob [facility], male    DOB: [birth year], 49 y.o.   MRN: [id number]  No chief complaint on file.   HPI Patient is a 49 year old male with past medical history significant for GERD, diverticulosis, asthma, vitamin D and vitamin B12 deficiency, hyperlipidemia, obesity who is followed by Dr. Ardyth Harps and who was seen for acute concerns.  Patient endorses having "bad feet".  Patient states he wants to get "on top of his health".  Patient notes discolored, thick toenails x1.5-2 years.  Also endorses pruritus of bilateral feet.  Tried soaking feet in bleach, tea tree oil, and fungal sprays.  Patient changing cotton socks every day.  Wears work boots.  Patient wears a back brace to work daily. Notes pruritic rash on lower abdomen x1 month where the brace is.  Patient endorses sweating while at work.  Past Medical History:  Diagnosis Date  . Asthma    past hx - not current   . Diverticulosis    sigmoid colon and descending colon  . GERD (gastroesophageal reflux disease)   . Hyperlipidemia    no medications   . Obesity (BMI 35.0-39.9 without comorbidity)     Allergies  Allergen Reactions  . Shellfish Allergy Anaphylaxis, Itching and Swelling  . Catfish [Fish Allergy] Itching and Other (See Comments)    Mainly focused on throat and inside mouth, itching   . Other     [place] nuts    ROS General: Denies fever, chills, night sweats, changes in weight, changes in appetite HEENT: Denies headaches, ear pain, changes in vision, rhinorrhea, sore throat CV: Denies CP, palpitations, SOB, orthopnea Pulm: Denies SOB, cough, wheezing GI: Denies abdominal pain, nausea, vomiting, diarrhea, constipation GU: Denies dysuria, hematuria, frequency Msk: Denies muscle cramps, joint pains Neuro: Denies weakness, numbness, tingling Skin: Denies bruising  + rash on abdomen.   discolored, thick toenail on bilateral feet Psych: Denies depression, anxiety, hallucinations      Objective:    Blood pressure 124/90, pulse 76, temperature 98 F (36.7 C), temperature source Oral, weight 247 lb 12.8 oz (112.4 kg), SpO2 98 %.  Gen. Pleasant, well-nourished, in no distress, normal affect  HEENT: [place]/AT, face symmetric, conjunctiva clear, no scleral icterus, PERRLA, EOMI, nares patent without drainage Lungs: no accessory muscle use Cardiovascular: RRR, no m/r/g, no peripheral edema Musculoskeletal: No deformities, no cyanosis or clubbing, normal tone Neuro:  A&Ox3, CN II-XII intact, normal gait Skin:  Warm, dry, intact.  Lower abdomen/ suprapubic area with mild erythema, excoriation, and faint satellite lesions.  Toes of bilateral feet thick, brittle, with yellow discoloration.  Toenails of bilateral great toes chipped far back.  No ingrown toenails noted.  Wt Readings from Last 3 Encounters:  12/19/19 246 lb (111.6 kg)  12/18/19 247 lb 11.2 oz (112.4 kg)  12/04/19 244 lb 6.4 oz (110.9 kg)    Lab Results  Component Value Date   WBC 5.5 08/20/2019   HGB 15.0 08/20/2019   HCT 48.4 08/20/2019   PLT 228 08/20/2019   GLUCOSE 113 (H) 08/20/2019   CHOL 189 05/06/2019   TRIG 145.0 05/06/2019   HDL 40.00 05/06/2019   LDLCALC 120 (H) 05/06/2019   ALT 21 08/20/2019   AST 24 08/20/2019   NA 138 08/20/2019   K 4.3 08/20/2019   CL 101 08/20/2019   CREATININE 1.24 08/20/2019   BUN 12 08/20/2019   CO2 27 08/20/2019   TSH 1.22 05/06/2019   PSA 0.69 05/06/2019   HGBA1C 5.8 (A)  08/22/2019    Assessment/Plan:  Onychomycosis -Discussed treatment options including creams, pills, laser therapy -Patient advised on likely duration of treatment before resolution may be noted -LFTs normal on 08/2019 -Place referral to podiatry -Given handout - Plan: Ambulatory referral to Podiatry  Candidal dermatitis -Advised back brace likely causing Candida infection -keep skin clean and dry -Given handout - Plan: nystatin cream (MYCOSTATIN)  Tinea pedis of both feet -Okay to  continue topical OTC treatment -Discussed wearing cotton socks, changing out socks daily, spraying shoes daily with antifungal spray - Plan: Ambulatory referral to Podiatry  Elevated blood-pressure reading without diagnosis of hypertension -BP mildly elevated at this visit -Lifestyle modifications encouraged -Follow-up in 1 month with PCP for reevaluation  F/u prn with pcp  Abbe Amsterdam, MD

## 2020-01-08 NOTE — Patient Instructions (Addendum)
Skin Yeast Infection  A skin yeast infection is a condition in which there is an overgrowth of yeast (candida) that normally lives on the skin. This condition usually occurs in areas of the skin that are constantly warm and moist, such as the armpits or the groin. What are the causes? This condition is caused by a change in the normal balance of the yeast and bacteria that live on the skin. What increases the risk? You are more likely to develop this condition if you:  Are obese.  Are pregnant.  Take birth control pills.  Have diabetes.  Take antibiotic medicines.  Take steroid medicines.  Are malnourished.  Have a weak body defense system (immune system).  Are 19 years of age or older.  Wear tight clothing. What are the signs or symptoms? The most common symptom of this condition is itchiness in the affected area. Other symptoms include:  Red, swollen area of the skin.  Bumps on the skin. How is this diagnosed?  This condition is diagnosed with a medical history and physical exam.  Your health care provider may check for yeast by taking light scrapings of the skin to be viewed under a microscope. How is this treated? This condition is treated with medicine. Medicines may be prescribed or be available over the counter. The medicines may be:  Taken by mouth (orally).  Applied as a cream or powder to your skin. Follow these instructions at home:   Take or apply over-the-counter and prescription medicines only as told by your health care provider.  Maintain a healthy weight. If you need help losing weight, talk with your health care provider.  Keep your skin clean and dry.  If you have diabetes, keep your blood sugar under control.  Keep all follow-up visits as told by your health care provider. This is important. Contact a health care provider if:  Your symptoms go away and then return.  Your symptoms do not get better with treatment.  Your symptoms get  worse.  Your rash spreads.  You have a fever or chills.  You have new symptoms.  You have new warmth or redness of your skin. Summary  A skin yeast infection is a condition in which there is an overgrowth of yeast (candida) that normally lives on the skin. This condition is caused by a change in the normal balance of the yeast and bacteria that live on the skin.  Take or apply over-the-counter and prescription medicines only as told by your health care provider.  Keep your skin clean and dry.  Contact a health care provider if your symptoms do not get better with treatment. This information is not intended to replace advice given to you by your health care provider. Make sure you discuss any questions you have with your health care provider. Document Revised: 06/20/2017 Document Reviewed: 06/20/2017 Elsevier Patient Education  2020 [employer]  Fungal Nail Infection A fungal nail infection is a common infection of the toenails or fingernails. This condition affects toenails more often than fingernails. It often affects the great, or big, toes. More than one nail may be infected. The condition can be passed from person to person (is contagious). What are the causes? This condition is caused by a fungus. Several types of fungi can cause the infection. These fungi are common in moist and warm areas. If your hands or feet come into contact with the fungus, it may get into a crack in your fingernail or toenail and cause  the infection. What increases the risk? The following factors may make you more likely to develop this condition:  Being male.  Being of older age.  Living with someone who has the fungus.  Walking barefoot in areas where the fungus thrives, such as showers or locker rooms.  Wearing shoes and socks that cause your feet to sweat.  Having a nail injury or a recent nail surgery.  Having certain medical conditions, such as: ? Athlete's  foot. ? Diabetes. ? Psoriasis. ? Poor circulation. ? A weak body defense system (immune system). What are the signs or symptoms? Symptoms of this condition include:  A pale spot on the nail.  Thickening of the nail.  A nail that becomes yellow or brown.  A brittle or ragged nail edge.  A crumbling nail.  A nail that has lifted away from the nail bed. How is this diagnosed? This condition is diagnosed with a physical exam. Your health care provider may take a scraping or clipping from your nail to test for the fungus. How is this treated? Treatment is not needed for mild infections. If you have significant nail changes, treatment may include:  Antifungal medicines taken by mouth (orally). You may need to take the medicine for several weeks or several months, and you may not see the results for a long time. These medicines can cause side effects. Ask your health care provider what problems to watch for.  Antifungal nail polish or nail cream. These may be used along with oral antifungal medicines.  Laser treatment of the nail.  Surgery to remove the nail. This may be needed for the most severe infections. It can take a long time, usually up to a year, for the infection to go away. The infection may also come back. Follow these instructions at home: Medicines  Take or apply over-the-counter and prescription medicines only as told by your health care provider.  Ask your health care provider about using over-the-counter mentholated ointment on your nails. Nail care  Trim your nails often.  Wash and dry your hands and feet every day.  Keep your feet dry: ? Wear absorbent socks, and change your socks frequently. ? Wear shoes that allow air to circulate, such as sandals or canvas tennis shoes. Throw out old shoes.  Do not use artificial nails.  If you go to a nail salon, make sure you choose one that uses clean instruments.  Use antifungal foot powder on your feet and in  your shoes. General instructions  Do not share personal items, such as towels or nail clippers.  Do not walk barefoot in shower rooms or locker rooms.  Wear rubber gloves if you are working with your hands in wet areas.  Keep all follow-up visits as told by your health care provider. This is important. Contact a health care provider if: Your infection is not getting better or it is getting worse after several months. Summary  A fungal nail infection is a common infection of the toenails or fingernails.  Treatment is not needed for mild infections. If you have significant nail changes, treatment may include taking medicine orally and applying medicine to your nails.  It can take a long time, usually up to a year, for the infection to go away. The infection may also come back.  Take or apply over-the-counter and prescription medicines only as told by your health care provider.  Follow instructions for taking care of your nails to help prevent infection from coming back or  spreading. This information is not intended to replace advice given to you by your health care provider. Make sure you discuss any questions you have with your health care provider. Document Revised: 05/24/2018 Document Reviewed: 07/07/2017 Elsevier Patient Education  2020 Else[employer] Inc.  Athlete's Foot  Athlete's foot (tinea pedis) is a fungal infection of the skin on your feet. It often occurs on the skin that is between or underneath the toes. It can also occur on the soles of your feet. The infection can spread from person to person (is contagious). It can also spread when a person's bare feet come in contact with the fungus on shower floors or on items such as shoes. What are the causes? This condition is caused by a fungus that grows in warm, moist places. You can get athlete's foot by sharing shoes, shower stalls, towels, and wet floors with someone who is infected. Not washing your feet or changing your socks  often enough can also lead to athlete's foot. What increases the risk? This condition is more likely to develop in:  Men.  People who have a weak body defense system (immune system).  People who have diabetes.  People who use public showers, such as at a gym.  People who wear heavy-duty shoes, such as Youth workerindustrial or military shoes.  Seasons with warm, humid weather. What are the signs or symptoms? Symptoms of this condition include:  Itchy areas between your toes or on the soles of your feet.  White, flaky, or scaly areas between your toes or on the soles of your feet.  Very itchy small blisters between your toes or on the soles of your feet.  Small cuts in your skin. These cuts can become infected.  Thick or discolored toenails. How is this diagnosed? This condition may be diagnosed with a physical exam and a review of your medical history. Your health care provider may also take a skin or toenail sample to examine under a microscope. How is this treated? This condition is treated with antifungal medicines. These may be applied as powders, ointments, or creams. In severe cases, an oral antifungal medicine may be given. Follow these instructions at home: Medicines  Apply or take over-the-counter and prescription medicines only as told by your health care provider.  Apply your antifungal medicine as told by your health care provider. Do not stop using the antifungal even if your condition improves. Foot care  Do not scratch your feet.  Keep your feet dry: ? Wear cotton or wool socks. Change your socks every day or if they become wet. ? Wear shoes that allow air to flow, such as sandals or canvas tennis shoes.  Wash and dry your feet, including the area between your toes. Also, wash and dry your feet: ? Every day or as told by your health care provider. ? After exercising. General instructions  Do not let others use towels, shoes, nail clippers, or other personal items  that touch your feet.  Protect your feet by wearing sandals in wet areas, such as locker rooms and shared showers.  Keep all follow-up visits as told by your health care provider. This is important.  If you have diabetes, keep your blood sugar under control. Contact a health care provider if:  You have a fever.  You have swelling, soreness, warmth, or redness in your foot.  Your feet are not getting better with treatment.  Your symptoms get worse.  You have new symptoms. Summary  Athlete's foot (tinea pedis)  is a fungal infection of the skin on your feet. It often occurs on skin that is between or underneath the toes.  This condition is caused by a fungus that grows in warm, moist places.  Symptoms include white, flaky, or scaly areas between your toes or on the soles of your feet.  This condition is treated with antifungal medicines.  Keep your feet clean. Always dry them thoroughly. This information is not intended to replace advice given to you by your health care provider. Make sure you discuss any questions you have with your health care provider. Document Revised: 01/26/2017 Document Reviewed: 11/21/2016 Elsevier Patient Education  2020 [employer].

## 2020-01-22 ENCOUNTER — Encounter: Payer: Self-pay | Admitting: Family Medicine

## 2020-01-22 ENCOUNTER — Other Ambulatory Visit: Payer: Self-pay

## 2020-01-22 ENCOUNTER — Ambulatory Visit (INDEPENDENT_AMBULATORY_CARE_PROVIDER_SITE_OTHER): Payer: BC Managed Care – PPO | Admitting: Family Medicine

## 2020-01-22 DIAGNOSIS — M546 Pain in thoracic spine: Secondary | ICD-10-CM | POA: Diagnosis not present

## 2020-01-22 NOTE — Patient Instructions (Addendum)
Good to see you  Overall I think you are making good improvement Try to do Exercises 2-3 times a week  Keep working on weight loss Continue Vit D See me again in 6 weeks if not perfect

## 2020-01-22 NOTE — Progress Notes (Signed)
[facility] [address] [place] [address] Phone: [phone] Subjective:   Jacob Williamson, am serving as a scribe for Dr. Antoine Primas. This visit occurred during the SARS-CoV-2 public health emergency.  Safety protocols were in place, including screening questions prior to the visit, additional usage of staff PPE, and extensive cleaning of exam room while observing appropriate contact time as indicated for disinfecting solutions.   I'm seeing this patient by the request  of:  Philip Aspen, Limmie Patricia, MD  CC: back pain follow up   [id number]  Jacob Williamson is a 49 y.o. male coming in with complaint of back pain   Last seen 12/19/2019 Acute right-sided back pain.  Patient seems to be more in the T10-T12 area.  We discussed with patient at this time he would like to do just some very mild anti-inflammatory for 10 days.  Discussed icing regimen, given home exercises.  X-rays pending.  Should do relatively well to conservative therapy though.  Patient will follow up with me again 4 weeks.  If having worsening pain will consider the possibility of physical therapy and even consider the possibility of osteopathic manipulation.  Update 01/22/2020 Patient states that he feels like he is doing better. He is wearing a back brace that helps and states that he does not feel ready to take it off. Patient still feel that there is something "there." Not taking the flexeril or meloxicam and takes the Vit D "but not like he should". Patient is doing exercises and goes to the gym.    Imaging DG X-ray thoracic spine done on 12/20/2019  Past Medical History:  Diagnosis Date  . Asthma    past hx - not current   . Diverticulosis    sigmoid colon and descending colon  . GERD (gastroesophageal reflux disease)   . Hyperlipidemia    no medications   . Obesity (BMI 35.0-39.9 without comorbidity)    Past Surgical History:  Procedure Laterality Date  .  COLONOSCOPY  05/20/2019   Social History   Socioeconomic History  . Marital status: Single    Spouse name: Not on file  . Number of children: Not on file  . Years of education: Not on file  . Highest education level: Not on file  Occupational History  . Not on file  Tobacco Use  . Smoking status: Former Games developer  . Smokeless tobacco: Never Used  Vaping Use  . Vaping Use: Never used  Substance and Sexual Activity  . Alcohol use: Yes    Comment: rarely  . Drug use: No  . Sexual activity: Yes  Other Topics Concern  . Not on file  Social History Narrative  . Not on file   Social Determinants of Health   Financial Resource Strain: Not on file  Food Insecurity: Not on file  Transportation Needs: Not on file  Physical Activity: Not on file  Stress: Not on file  Social Connections: Not on file   Allergies  Allergen Reactions  . Shellfish Allergy Anaphylaxis, Itching and Swelling  . Catfish [Fish Allergy] Itching and Other (See Comments)    Mainly focused on throat and inside mouth, itching   . Other     [place] nuts   Family History  Problem Relation Age of Onset  . Hypertension Mother   . Diabetes Mother   . Hypertension Father   . Asthma Father   . Diabetes Maternal Grandmother   . Colon cancer Neg Hx   .  Colon polyps Neg Hx   . Esophageal cancer Neg Hx   . Rectal cancer Neg Hx   . Stomach cancer Neg Hx        Current Outpatient Medications (Analgesics):  .  meloxicam (MOBIC) 7.5 MG tablet, Take 1 tablet (7.5 mg total) by mouth daily. (Patient not taking: Reported on 01/22/2020)  Current Outpatient Medications (Hematological):  .  cyanocobalamin (,VITAMIN B-12,) 1000 MCG/ML injection, Inject 107ml in deltoid once weekly for 4 weeks, then inject 1 ml once a month thereafter  Current Outpatient Medications (Other):  Marland Kitchen  BLACK CURRANT SEED OIL PO, Take by mouth daily. .  Multiple Vitamin (MULTIVITAMIN) tablet, Take 1 tablet by mouth daily. Marland Kitchen  nystatin cream  (MYCOSTATIN), Apply 1 application topically 2 (two) times daily. .  SYRINGE-NEEDLE, DISP, 3 ML (BD SAFETYGLIDE SYRINGE/NEEDLE) 25G X 1" 3 ML MISC, Use for B12 injections .  Vitamin D, Ergocalciferol, (DRISDOL) 1.25 MG (50000 UNIT) CAPS capsule, Take 50,000 Units by mouth once a week. .  cyclobenzaprine (FLEXERIL) 5 MG tablet, Take 1 tablet (5 mg total) by mouth at bedtime as needed for muscle spasms. (Patient not taking: Reported on 01/22/2020)   Reviewed prior external information including notes and imaging from  primary care provider As well as notes that were available from care everywhere and other healthcare systems.  Past medical history, social, surgical and family history all reviewed in electronic medical record.  No pertanent information unless stated regarding to the chief complaint.   Review of Systems:  No headache, visual changes, nausea, vomiting, diarrhea, constipation, dizziness, abdominal pain, skin rash, fevers, chills, night sweats, weight loss, swollen lymph nodes, body aches, joint swelling, chest pain, shortness of breath, mood changes. POSITIVE muscle aches  Objective  Blood pressure 124/88, pulse 76, height 5' 9.5" (1.765 m), weight 238 lb (108 kg), SpO2 98 %.   General: No apparent distress alert and oriented x3 mood and affect normal, dressed appropriately.  HEENT: Pupils equal, extraocular movements intact  Respiratory: Patient's speak in full sentences and does not appear short of breath  Cardiovascular: No lower extremity edema, non tender, no erythema  MSK:  Non tender with full range of motion and good stability and symmetric strength and tone of shoulders, elbows, wrist, hip, knee and ankles bilaterally.  Mid back mild discomfort T10 paraspinal musculature but improvement. Full rotation and full flexion and extension  Full strength in all extremities    Impression and Recommendations:     The above documentation has been reviewed and is accurate and  complete Judi Saa, DO

## 2020-01-23 ENCOUNTER — Encounter: Payer: Self-pay | Admitting: Family Medicine

## 2020-01-23 NOTE — Assessment & Plan Note (Signed)
xrays were normal, 80% better with conservative therapy. Is doing most ADL's without any pain  Discussed, continue HEP, discussed posture  RTC in 2-3 months if not perfect

## 2020-01-24 ENCOUNTER — Ambulatory Visit (INDEPENDENT_AMBULATORY_CARE_PROVIDER_SITE_OTHER): Payer: BC Managed Care – PPO | Admitting: Podiatry

## 2020-01-24 ENCOUNTER — Ambulatory Visit: Payer: BC Managed Care – PPO | Admitting: Podiatry

## 2020-01-24 ENCOUNTER — Other Ambulatory Visit: Payer: Self-pay

## 2020-01-24 DIAGNOSIS — B351 Tinea unguium: Secondary | ICD-10-CM

## 2020-01-24 DIAGNOSIS — L603 Nail dystrophy: Secondary | ICD-10-CM

## 2020-01-24 MED ORDER — FLUCONAZOLE 150 MG PO TABS
150.0000 mg | ORAL_TABLET | ORAL | 2 refills | Status: DC
Start: 2020-01-24 — End: 2020-04-30

## 2020-01-24 NOTE — Progress Notes (Signed)
  Subjective:  Patient ID: Jacob Williamson, male    DOB: [birth year],  MRN: [id number]  Chief Complaint  Patient presents with  . Nail Problem    Pt states thick/discolored nails 2 yr duration.   49 y.o. male presents with the above complaint. History confirmed with patient.   Objective:  Physical Exam: warm, good capillary refill, no trophic changes or ulcerative lesions, normal DP and PT pulses and normal sensory exam. Nails bilat hallux thickened, dystrophic, no pain to palpation. Yellow in appearance and crumbly in texture Assessment:   1. Onychomycosis   2. Nail dystrophy    Plan:  Patient was evaluated and treated and all questions answered.  Onychomycosis -Nails debrided x4 -Start fluconazole weekly -Discussed etiology and need to sterilize shoegear -F/u in 2 months for recheck   Return in about 2 months (around 03/26/2020) for Nail Fungus.

## 2020-02-21 ENCOUNTER — Telehealth: Payer: BC Managed Care – PPO | Admitting: Family Medicine

## 2020-02-21 ENCOUNTER — Telehealth: Payer: BC Managed Care – PPO | Admitting: Internal Medicine

## 2020-02-25 ENCOUNTER — Telehealth (INDEPENDENT_AMBULATORY_CARE_PROVIDER_SITE_OTHER): Payer: BC Managed Care – PPO | Admitting: Family Medicine

## 2020-02-25 DIAGNOSIS — R21 Rash and other nonspecific skin eruption: Secondary | ICD-10-CM

## 2020-02-25 NOTE — Patient Instructions (Addendum)
Start the antifungal.  Schedule an inperson visit in 1-2 weeks with your primary care office.  I hope you are feeling better soon!  Seek in person care promptly if your symptoms worsen, new concerns arise or you are not improving with treatment.  It was nice to meet you today. I help [facility] out with telemedicine visits on Tuesdays and Thursdays and am available for visits on those days. If you have any concerns or questions following this visit please schedule a follow up visit with your Primary Care doctor or seek care at a local urgent care clinic to avoid delays in care.

## 2020-02-25 NOTE — Progress Notes (Signed)
Virtual Visit via Video Note  I connected with Jacob Williamson  on 02/25/20 at  6:00 PM EST by a video enabled telemedicine application and verified that I am speaking with the correct person using two identifiers.  Location patient: home, [place] Location provider:work or home office Persons participating in the virtual visit: patient, provider  I discussed the limitations of evaluation and management by telemedicine and the availability of in person appointments. The patient expressed understanding and agreed to proceed.   HPI:  Acute telemedicine visit for a rash: -Onset: 3 months ago -Symptoms include: location in pubic hairs only - reports tried a yeast cream which did not work, very itchy and reports hair loss that is patchy in this area as well -grandson's diaper rash cream help -he was also prescribe an antifungal for toenail fungus, but has not taken it yet as needs to pick it up -Denies: rash elsewhere, pustules, drainage -reports has not been seen for this rash   ROS: See pertinent positives and negatives per HPI.  Past Medical History:  Diagnosis Date  . Asthma    past hx - not current   . Diverticulosis    sigmoid colon and descending colon  . GERD (gastroesophageal reflux disease)   . Hyperlipidemia    no medications   . Obesity (BMI 35.0-39.9 without comorbidity)     Past Surgical History:  Procedure Laterality Date  . COLONOSCOPY  05/20/2019     Current Outpatient Medications:  .  BLACK CURRANT SEED OIL PO, Take by mouth daily., Disp: , Rfl:  .  cyanocobalamin (,VITAMIN B-12,) 1000 MCG/ML injection, Inject 70ml in deltoid once weekly for 4 weeks, then inject 1 ml once a month thereafter, Disp: 6 mL, Rfl: 1 .  cyclobenzaprine (FLEXERIL) 5 MG tablet, Take 1 tablet (5 mg total) by mouth at bedtime as needed for muscle spasms., Disp: 20 tablet, Rfl: 0 .  fluconazole (DIFLUCAN) 150 MG tablet, Take 1 tablet (150 mg total) by mouth once a week., Disp: 4 tablet, Rfl: 2 .   meloxicam (MOBIC) 7.5 MG tablet, Take 1 tablet (7.5 mg total) by mouth daily., Disp: 30 tablet, Rfl: 0 .  Multiple Vitamin (MULTIVITAMIN) tablet, Take 1 tablet by mouth daily., Disp: , Rfl:  .  nystatin cream (MYCOSTATIN), Apply 1 application topically 2 (two) times daily., Disp: 30 g, Rfl: 0 .  SYRINGE-NEEDLE, DISP, 3 ML (BD SAFETYGLIDE SYRINGE/NEEDLE) 25G X 1" 3 ML MISC, Use for B12 injections, Disp: 100 each, Rfl: 11 .  Vitamin D, Ergocalciferol, (DRISDOL) 1.25 MG (50000 UNIT) CAPS capsule, Take 50,000 Units by mouth once a week., Disp: , Rfl:   EXAM:  VITALS per patient if applicable:  GENERAL: alert, oriented, appears well and in no acute distress  HEENT: atraumatic, conjunttiva clear, no obvious abnormalities on inspection of external nose and ears  NECK: normal movements of the head and neck  LUNGS: on inspection no signs of respiratory distress, breathing rate appears normal, no obvious gross SOB, gasping or wheezing  CV: no obvious cyanosis  SKIN: not examined due to sensitive area/not chaperoned/video visit restrictions  MS: moves all visible extremities without noticeable abnormality  PSYCH/NEURO: pleasant and cooperative, no obvious depression or anxiety, speech and thought processing grossly intact  ASSESSMENT AND PLAN:  Discussed the following assessment and plan:  Skin rash  -we discussed possible serious and likely etiologies, options for evaluation and workup, limitations of telemedicine visit vs in person visit, treatment, treatment risks and precautions. Pt prefers to treat via  telemedicine empirically rather than in person at this moment. Query fungal vs other. He has been rxd an antifugal by podiatrist for a toenail fungus but has not yet picked up the rx. Reports they checked his liver enzymes before starting. He is unsure of the name. Opted for starting it and close follow up with PCP office for inperson evaluation. Scheduled follow up with PCP offered: Sent  message to schedulers to assist and advised patient to contact PCP office to schedule if does not receive call back in next 24 hours. Advised to seek prompt in person care if worsening, new symptoms arise, or if is not improving with treatment. Discussed options for inperson care if PCP office not available. Did let this patient know that I only do telemedicine on Tuesdays and Thursdays for [place]. Advised to schedule follow up visit with PCP or [facility] if any further questions or concerns to avoid delays in care.   I discussed the assessment and treatment plan with the patient. The patient was provided an opportunity to ask questions and all were answered. The patient agreed with the plan and demonstrated an understanding of the instructions.     Terressa Koyanagi, DO

## 2020-02-27 ENCOUNTER — Telehealth: Payer: Self-pay | Admitting: Internal Medicine

## 2020-02-27 NOTE — Telephone Encounter (Signed)
error 

## 2020-03-11 ENCOUNTER — Encounter: Payer: Self-pay | Admitting: Internal Medicine

## 2020-03-11 ENCOUNTER — Other Ambulatory Visit: Payer: Self-pay

## 2020-03-11 ENCOUNTER — Ambulatory Visit (INDEPENDENT_AMBULATORY_CARE_PROVIDER_SITE_OTHER): Payer: BC Managed Care – PPO | Admitting: Internal Medicine

## 2020-03-11 VITALS — BP 120/74 | HR 96 | Temp 98.2°F | Wt 237.7 lb

## 2020-03-11 DIAGNOSIS — B372 Candidiasis of skin and nail: Secondary | ICD-10-CM | POA: Diagnosis not present

## 2020-03-11 MED ORDER — NYSTATIN 100000 UNIT/GM EX POWD: 1.0000 "application " | Freq: Three times a day (TID) | CUTANEOUS | 1 refills | Status: DC

## 2020-03-11 NOTE — Progress Notes (Signed)
Acute office Visit     This visit occurred during the SARS-CoV-2 public health emergency.  Safety protocols were in place, including screening questions prior to the visit, additional usage of staff PPE, and extensive cleaning of exam room while observing appropriate contact time as indicated for disinfecting solutions.    CC/Reason for Visit: Yeast in groin and under abdominal pannus  HPI: Jacob Williamson is a 50 y.o. male who is coming in today for the above mentioned reasons.  For about 2 to 3 weeks he has noticed a worsening rash under his abdominal pannus that is now extending to his chronic.  He had a virtual visit and was prescribed nystatin cream which seemed to help some, baby diaper rash cream has also helped.  It is very itchy.  It is moist.   Past Medical/Surgical History: Past Medical History:  Diagnosis Date  . Asthma    past hx - not current   . Diverticulosis    sigmoid colon and descending colon  . GERD (gastroesophageal reflux disease)   . Hyperlipidemia    no medications   . Obesity (BMI 35.0-39.9 without comorbidity)     Past Surgical History:  Procedure Laterality Date  . COLONOSCOPY  05/20/2019    Social History:  reports that he has quit smoking. He has never used smokeless tobacco. He reports current alcohol use. He reports that he does not use drugs.  Allergies: Allergies  Allergen Reactions  . Shellfish Allergy Anaphylaxis, Itching and Swelling  . Catfish [Fish Allergy] Itching and Other (See Comments)    Mainly focused on throat and inside mouth, itching   . Other     [place] nuts    Family History:  Family History  Problem Relation Age of Onset  . Hypertension Mother   . Diabetes Mother   . Hypertension Father   . Asthma Father   . Diabetes Maternal Grandmother   . Colon cancer Neg Hx   . Colon polyps Neg Hx   . Esophageal cancer Neg Hx   . Rectal cancer Neg Hx   . Stomach cancer Neg Hx      Current Outpatient  Medications:  .  BLACK CURRANT SEED OIL PO, Take by mouth daily., Disp: , Rfl:  .  cyanocobalamin (,VITAMIN B-12,) 1000 MCG/ML injection, Inject 12ml in deltoid once weekly for 4 weeks, then inject 1 ml once a month thereafter, Disp: 6 mL, Rfl: 1 .  cyclobenzaprine (FLEXERIL) 5 MG tablet, Take 1 tablet (5 mg total) by mouth at bedtime as needed for muscle spasms., Disp: 20 tablet, Rfl: 0 .  fluconazole (DIFLUCAN) 150 MG tablet, Take 1 tablet (150 mg total) by mouth once a week., Disp: 4 tablet, Rfl: 2 .  meloxicam (MOBIC) 7.5 MG tablet, Take 1 tablet (7.5 mg total) by mouth daily., Disp: 30 tablet, Rfl: 0 .  Multiple Vitamin (MULTIVITAMIN) tablet, Take 1 tablet by mouth daily., Disp: , Rfl:  .  nystatin (MYCOSTATIN/NYSTOP) powder, Apply 1 application topically 3 (three) times daily., Disp: 15 g, Rfl: 1 .  SYRINGE-NEEDLE, DISP, 3 ML (BD SAFETYGLIDE SYRINGE/NEEDLE) 25G X 1" 3 ML MISC, Use for B12 injections, Disp: 100 each, Rfl: 11 .  Vitamin D, Ergocalciferol, (DRISDOL) 1.25 MG (50000 UNIT) CAPS capsule, Take 50,000 Units by mouth once a week., Disp: , Rfl:   Review of Systems:  Constitutional: Denies fever, chills, diaphoresis, appetite change and fatigue.  HEENT: Denies photophobia, eye pain, redness, hearing loss, ear pain, congestion, sore  throat, rhinorrhea, sneezing, mouth sores, trouble swallowing, neck pain, neck stiffness and tinnitus.   Respiratory: Denies SOB, DOE, cough, chest tightness,  and wheezing.   Cardiovascular: Denies chest pain, palpitations and leg swelling.  Gastrointestinal: Denies nausea, vomiting, abdominal pain, diarrhea, constipation, blood in stool and abdominal distention.  Genitourinary: Denies dysuria, urgency, frequency, hematuria, flank pain and difficulty urinating.  Endocrine: Denies: hot or cold intolerance, sweats, changes in hair or nails, polyuria, polydipsia. Musculoskeletal: Denies myalgias, back pain, joint swelling, arthralgias and gait problem.  Skin:  Denies pallor and wound.  Neurological: Denies dizziness, seizures, syncope, weakness, light-headedness, numbness and headaches.  Hematological: Denies adenopathy. Easy bruising, personal or family bleeding history  Psychiatric/Behavioral: Denies suicidal ideation, mood changes, confusion, nervousness, sleep disturbance and agitation    Physical Exam: Vitals:   03/11/20 1503  BP: 120/74  Pulse: 96  Temp: 98.2 F (36.8 C)  TempSrc: Oral  SpO2: 96%  Weight: 237 lb 11.2 oz (107.8 kg)    Body mass index is 34.6 kg/m.   Constitutional: NAD, calm, comfortable Eyes: PERRL, lids and conjunctivae normal, wears corrective lenses ENMT: Mucous membranes are moist.  Respiratory: clear to auscultation bilaterally, no wheezing, no crackles. Normal respiratory effort. No accessory muscle use.  Cardiovascular: Regular rate and rhythm, no murmurs / rubs / gallops. No extremity edema.  Neurologic grossly intact and nonfocal Psychiatric: Normal judgment and insight. Alert and oriented x 3. Normal mood.    Impression and Plan:  Yeast dermatitis  -Advised that he apply nystatin powder twice daily to clean, dry skin. -He will follow-up if he continues to have issues.    Chaya Jan, MD [facility]

## 2020-03-27 ENCOUNTER — Ambulatory Visit (INDEPENDENT_AMBULATORY_CARE_PROVIDER_SITE_OTHER): Payer: BC Managed Care – PPO | Admitting: Podiatry

## 2020-03-27 DIAGNOSIS — Z5329 Procedure and treatment not carried out because of patient's decision for other reasons: Secondary | ICD-10-CM

## 2020-04-03 NOTE — Progress Notes (Signed)
   Complete physical exam  Patient: Jacob Williamson   DOB: 12/04/1996   50 y.o. Male  MRN: [id number]  Subjective:    No chief complaint on file.   Jacob Williamson is a 50 y.o. male who presents today for a complete physical exam. She reports consuming a {diet types:17450} diet. {types:19826} She generally feels {DESC; WELL/FAIRLY WELL/POORLY:18703}. She reports sleeping {DESC; WELL/FAIRLY WELL/POORLY:18703}. She {does/does not:200015} have additional problems to discuss today.    Most recent fall risk assessment:    08/11/2021   10:42 AM  Fall Risk   Falls in the past year? 0  Number falls in past yr: 0  Injury with Fall? 0  Risk for fall due to : No Fall Risks  Follow up Falls evaluation completed     Most recent depression screenings:    08/11/2021   10:42 AM 07/02/2020   10:46 AM  PHQ 2/9 Scores  PHQ - 2 Score 0 0  PHQ- 9 Score 5     {VISON DENTAL STD PSA (Optional):27386}  {History (Optional):23778}  Patient Care Team: Jessup, Joy, NP as PCP - General (Nurse Practitioner)   Outpatient Medications Prior to Visit  Medication Sig   fluticasone (FLONASE) 50 MCG/ACT nasal spray Place 2 sprays into both nostrils in the morning and at bedtime. After 7 days, reduce to once daily.   norgestimate-ethinyl estradiol (SPRINTEC 28) 0.25-35 MG-MCG tablet Take 1 tablet by mouth daily.   Nystatin POWD Apply liberally to affected area 2 times per day   spironolactone (ALDACTONE) 100 MG tablet Take 1 tablet (100 mg total) by mouth daily.   No facility-administered medications prior to visit.    ROS        Objective:     There were no vitals taken for this visit. {Vitals History (Optional):23777}  Physical Exam   No results found for any visits on 09/16/21. {Show previous labs (optional):23779}    Assessment & Plan:    Routine Health Maintenance and Physical Exam  Immunization History  Administered Date(s) Administered   DTaP 02/17/1999, 04/15/1999,  06/24/1999, 03/09/2000, 09/23/2003   Hepatitis A 07/20/2007, 07/25/2008   Hepatitis B 12/05/1998, 01/12/1999, 06/24/1999   HiB (PRP-OMP) 02/17/1999, 04/15/1999, 06/24/1999, 03/09/2000   IPV 02/17/1999, 04/15/1999, 12/13/1999, 09/23/2003   Influenza,inj,Quad PF,6+ Mos 10/25/2013   Influenza-Unspecified 01/25/2012   MMR 12/12/2000, 09/23/2003   Meningococcal Polysaccharide 07/25/2011   Pneumococcal Conjugate-13 03/09/2000   Pneumococcal-Unspecified 06/24/1999, 09/07/1999   Tdap 07/25/2011   Varicella 12/13/1999, 07/20/2007    Health Maintenance  Topic Date Due   HIV Screening  Never done   Hepatitis C Screening  Never done   INFLUENZA VACCINE  09/14/2021   PAP-Cervical Cytology Screening  09/16/2021 (Originally 12/04/2019)   PAP SMEAR-Modifier  09/16/2021 (Originally 12/04/2019)   TETANUS/TDAP  09/16/2021 (Originally 07/24/2021)   HPV VACCINES  Discontinued   COVID-19 Vaccine  Discontinued    Discussed health benefits of physical activity, and encouraged her to engage in regular exercise appropriate for her age and condition.  Problem List Items Addressed This Visit   None Visit Diagnoses     Annual physical exam    -  Primary   Cervical cancer screening       Need for Tdap vaccination          No follow-ups on file.     Joy Jessup, NP   

## 2020-04-29 ENCOUNTER — Other Ambulatory Visit: Payer: Self-pay

## 2020-04-30 ENCOUNTER — Encounter: Payer: Self-pay | Admitting: Internal Medicine

## 2020-04-30 ENCOUNTER — Ambulatory Visit (INDEPENDENT_AMBULATORY_CARE_PROVIDER_SITE_OTHER): Payer: BC Managed Care – PPO | Admitting: Internal Medicine

## 2020-04-30 VITALS — BP 140/90 | HR 75 | Temp 98.2°F | Wt 245.0 lb

## 2020-04-30 DIAGNOSIS — E538 Deficiency of other specified B group vitamins: Secondary | ICD-10-CM

## 2020-04-30 DIAGNOSIS — N529 Male erectile dysfunction, unspecified: Secondary | ICD-10-CM

## 2020-04-30 DIAGNOSIS — E669 Obesity, unspecified: Secondary | ICD-10-CM

## 2020-04-30 DIAGNOSIS — R030 Elevated blood-pressure reading, without diagnosis of hypertension: Secondary | ICD-10-CM | POA: Diagnosis not present

## 2020-04-30 DIAGNOSIS — R7302 Impaired glucose tolerance (oral): Secondary | ICD-10-CM

## 2020-04-30 MED ORDER — CYANOCOBALAMIN 1000 MCG/ML IJ SOLN
1000.0000 ug | Freq: Once | INTRAMUSCULAR | Status: AC
  Administered 2020-04-30: 1000 ug via INTRAMUSCULAR

## 2020-04-30 MED ORDER — TADALAFIL 20 MG PO TABS: 10.0000 mg | ORAL_TABLET | ORAL | 11 refills | Status: DC | PRN

## 2020-04-30 NOTE — Patient Instructions (Signed)
-  Nice seeing you today!!  -B12 injection today.  -Cialis half to full tablet as needed.

## 2020-04-30 NOTE — Progress Notes (Signed)
Established Patient Office Visit     This visit occurred during the SARS-CoV-2 public health emergency.  Safety protocols were in place, including screening questions prior to the visit, additional usage of staff PPE, and extensive cleaning of exam room while observing appropriate contact time as indicated for disinfecting solutions.    CC/Reason for Visit: Discuss high blood pressure and erectile dysfunction  HPI: Jacob Williamson is a 50 y.o. male who is coming in today for the above mentioned reasons. Past Medical History is significant for: Hyperlipidemia, obesity, impaired glucose tolerance as well as vitamin D and B12 deficiency.  He has not done replacement for any vitamin deficiencies.  He has noticed lately that his blood pressure has been elevated at several instances usually with systolics around the 140s and diastolics in the upper 80s to low 90s.  He does not check his blood pressure routinely only when he visits the drugstore.  He does not own a blood pressure cuff.  He has noticed lately that he is not getting a full erection.  He admits to some increased anxiety.  He has gained 7 pounds since her last visit in January.  He is not having chest pain or shortness of breath.   Past Medical/Surgical History: Past Medical History:  Diagnosis Date  . Asthma    past hx - not current   . Diverticulosis    sigmoid colon and descending colon  . GERD (gastroesophageal reflux disease)   . Hyperlipidemia    no medications   . Obesity (BMI 35.0-39.9 without comorbidity)     Past Surgical History:  Procedure Laterality Date  . COLONOSCOPY  05/20/2019    Social History:  reports that he has quit smoking. He has never used smokeless tobacco. He reports current alcohol use. He reports that he does not use drugs.  Allergies: Allergies  Allergen Reactions  . Shellfish Allergy Anaphylaxis, Itching and Swelling  . Catfish [Fish Allergy] Itching and Other (See Comments)     Mainly focused on throat and inside mouth, itching   . Other     [place] nuts    Family History:  Family History  Problem Relation Age of Onset  . Hypertension Mother   . Diabetes Mother   . Hypertension Father   . Asthma Father   . Diabetes Maternal Grandmother   . Colon cancer Neg Hx   . Colon polyps Neg Hx   . Esophageal cancer Neg Hx   . Rectal cancer Neg Hx   . Stomach cancer Neg Hx      Current Outpatient Medications:  .  BLACK CURRANT SEED OIL PO, Take by mouth daily., Disp: , Rfl:  .  cyanocobalamin (,VITAMIN B-12,) 1000 MCG/ML injection, Inject 30ml in deltoid once weekly for 4 weeks, then inject 1 ml once a month thereafter, Disp: 6 mL, Rfl: 1 .  Multiple Vitamin (MULTIVITAMIN) tablet, Take 1 tablet by mouth daily., Disp: , Rfl:  .  nystatin (MYCOSTATIN/NYSTOP) powder, Apply 1 application topically 3 (three) times daily., Disp: 15 g, Rfl: 1 .  SYRINGE-NEEDLE, DISP, 3 ML (BD SAFETYGLIDE SYRINGE/NEEDLE) 25G X 1" 3 ML MISC, Use for B12 injections, Disp: 100 each, Rfl: 11 .  tadalafil (CIALIS) 20 MG tablet, Take 0.5-1 tablets (10-20 mg total) by mouth every other day as needed for erectile dysfunction., Disp: 5 tablet, Rfl: 11 .  Vitamin D, Ergocalciferol, (DRISDOL) 1.25 MG (50000 UNIT) CAPS capsule, Take 50,000 Units by mouth once a week., Disp: , Rfl:  Review of Systems:  Constitutional: Denies fever, chills, diaphoresis, appetite change and fatigue.  HEENT: Denies photophobia, eye pain, redness, hearing loss, ear pain, congestion, sore throat, rhinorrhea, sneezing, mouth sores, trouble swallowing, neck pain, neck stiffness and tinnitus.   Respiratory: Denies SOB, DOE, cough, chest tightness,  and wheezing.   Cardiovascular: Denies chest pain, palpitations and leg swelling.  Gastrointestinal: Denies nausea, vomiting, abdominal pain, diarrhea, constipation, blood in stool and abdominal distention.  Genitourinary: Denies dysuria, urgency, frequency, hematuria, flank pain  and difficulty urinating.  Endocrine: Denies: hot or cold intolerance, sweats, changes in hair or nails, polyuria, polydipsia. Musculoskeletal: Denies myalgias, back pain, joint swelling, arthralgias and gait problem.  Skin: Denies pallor, rash and wound.  Neurological: Denies dizziness, seizures, syncope, weakness, light-headedness, numbness and headaches.  Hematological: Denies adenopathy. Easy bruising, personal or family bleeding history  Psychiatric/Behavioral: Denies suicidal ideation, mood changes, confusion, nervousness, sleep disturbance and agitation    Physical Exam: Vitals:   04/30/20 1508  BP: 140/90  Pulse: 75  Temp: 98.2 F (36.8 C)  TempSrc: Oral  SpO2: 95%  Weight: 245 lb (111.1 kg)    Body mass index is 35.66 kg/m.   Constitutional: NAD, calm, comfortable, obese Eyes: PERRL, lids and conjunctivae normal, wears corrective lenses ENMT: Mucous membranes are moist.  Respiratory: clear to auscultation bilaterally, no wheezing, no crackles. Normal respiratory effort. No accessory muscle use.  Cardiovascular: Regular rate and rhythm, no murmurs / rubs / gallops.  Neurologic: Grossly intact and nonfocal Psychiatric: Normal judgment and insight. Alert and oriented x 3. Normal mood.    Impression and Plan:  Elevated BP without diagnosis of hypertension -He will do ambulatory blood pressure monitoring and return in 8 weeks for follow-up.  Obesity (BMI 35.0-39.9 without comorbidity) -Discussed healthy lifestyle, including increased physical activity and better food choices to promote weight loss.  Erectile dysfunction, unspecified erectile dysfunction type  - Plan: tadalafil (CIALIS) 20 MG tablet -Suspect obesity and sedentary lifestyle are playing a role in this.  IGT (impaired glucose tolerance) -A1c was 5.8 in July 2021,  we have discussed importance of aggressive lifestyle modifications.  B12 deficiency -First IM injection today.   Patient Instructions   -Nice seeing you today!!  -B12 injection today.  -Cialis half to full tablet as needed.     Chaya Jan, MD [facility]

## 2020-06-25 ENCOUNTER — Ambulatory Visit: Payer: BC Managed Care – PPO | Admitting: Internal Medicine

## 2020-07-01 ENCOUNTER — Ambulatory Visit (HOSPITAL_COMMUNITY)
Admission: EM | Admit: 2020-07-01 | Discharge: 2020-07-01 | Disposition: A | Discharge status: Home / Self Care | Payer: BC Managed Care – PPO | Attending: Medical Oncology | Admitting: Medical Oncology

## 2020-07-01 ENCOUNTER — Encounter (HOSPITAL_COMMUNITY): Payer: Self-pay

## 2020-07-01 ENCOUNTER — Emergency Department (HOSPITAL_COMMUNITY)
Admission: EM | Admit: 2020-07-01 | Discharge: 2020-07-01 | Disposition: A | Discharge status: Home / Self Care | Payer: BC Managed Care – PPO | Attending: Emergency Medicine | Admitting: Emergency Medicine

## 2020-07-01 ENCOUNTER — Other Ambulatory Visit: Payer: Self-pay

## 2020-07-01 DIAGNOSIS — J45909 Unspecified asthma, uncomplicated: Secondary | ICD-10-CM | POA: Diagnosis not present

## 2020-07-01 DIAGNOSIS — R509 Fever, unspecified: Secondary | ICD-10-CM | POA: Diagnosis not present

## 2020-07-01 DIAGNOSIS — I10 Essential (primary) hypertension: Secondary | ICD-10-CM | POA: Insufficient documentation

## 2020-07-01 DIAGNOSIS — Z28310 Unvaccinated for covid-19: Secondary | ICD-10-CM | POA: Diagnosis not present

## 2020-07-01 DIAGNOSIS — Z87891 Personal history of nicotine dependence: Secondary | ICD-10-CM | POA: Insufficient documentation

## 2020-07-01 DIAGNOSIS — U071 COVID-19: Secondary | ICD-10-CM | POA: Diagnosis not present

## 2020-07-01 DIAGNOSIS — R519 Headache, unspecified: Secondary | ICD-10-CM

## 2020-07-01 DIAGNOSIS — M542 Cervicalgia: Secondary | ICD-10-CM | POA: Diagnosis not present

## 2020-07-01 LAB — CBC
HCT: 48.2 % (ref 39.0–52.0)
Hemoglobin: 15.2 g/dL (ref 13.0–17.0)
MCH: 26 pg (ref 26.0–34.0)
MCHC: 31.5 g/dL (ref 30.0–36.0)
MCV: 82.5 fL (ref 80.0–100.0)
Platelets: 209 10*3/uL (ref 150–400)
RBC: 5.84 MIL/uL — ABNORMAL HIGH (ref 4.22–5.81)
RDW: 14.7 % (ref 11.5–15.5)
WBC: 5.4 10*3/uL (ref 4.0–10.5)
nRBC: 0 % (ref 0.0–0.2)

## 2020-07-01 LAB — COMPREHENSIVE METABOLIC PANEL
ALT: 23 U/L (ref 0–44)
AST: 28 U/L (ref 15–41)
Albumin: 4 g/dL (ref 3.5–5.0)
Alkaline Phosphatase: 50 U/L (ref 38–126)
Anion gap: 10 (ref 5–15)
BUN: 12 mg/dL (ref 6–20)
CO2: 25 mmol/L (ref 22–32)
Calcium: 8.9 mg/dL (ref 8.9–10.3)
Chloride: 102 mmol/L (ref 98–111)
Creatinine, Ser: 1.41 mg/dL — ABNORMAL HIGH (ref 0.61–1.24)
GFR, Estimated: >60 mL/min (ref >60)
Glucose, Bld: 95 mg/dL (ref 70–99)
Potassium: 3.9 mmol/L (ref 3.5–5.1)
Sodium: 137 mmol/L (ref 135–145)
Total Bilirubin: 0.5 mg/dL (ref 0.3–1.2)
Total Protein: 6.8 g/dL (ref 6.5–8.1)

## 2020-07-01 LAB — RESP PANEL BY RT-PCR (FLU A&B, COVID) ARPGX2
Influenza A by PCR: NEGATIVE
Influenza B by PCR: NEGATIVE
SARS Coronavirus 2 by RT PCR: POSITIVE — AB

## 2020-07-01 MED ORDER — NIRMATRELVIR/RITONAVIR (PAXLOVID)TABLET: 3.0000 | ORAL_TABLET | Freq: Two times a day (BID) | ORAL | 0 refills | Status: AC

## 2020-07-01 MED ORDER — ACETAMINOPHEN 325 MG PO TABS
650.0000 mg | ORAL_TABLET | Freq: Once | ORAL | Status: AC
  Administered 2020-07-01: 650 mg via ORAL

## 2020-07-01 MED ORDER — ACETAMINOPHEN 325 MG PO TABS
ORAL_TABLET | ORAL | Status: AC
  Filled 2020-07-01: qty 2

## 2020-07-01 MED ORDER — NIRMATRELVIR/RITONAVIR (PAXLOVID)TABLET: 3.0000 | ORAL_TABLET | Freq: Two times a day (BID) | ORAL | 0 refills | Status: DC

## 2020-07-01 MED ORDER — IBUPROFEN 400 MG PO TABS
600.0000 mg | ORAL_TABLET | Freq: Once | ORAL | Status: AC
  Administered 2020-07-01: 600 mg via ORAL
  Filled 2020-07-01: qty 1

## 2020-07-01 NOTE — ED Triage Notes (Signed)
Pt presents with headache with sensitivity to light, neck pain, fatigue, and generalized body aches since yesterday.

## 2020-07-01 NOTE — ED Provider Notes (Signed)
Emergency Medicine Provider Triage Evaluation Note  Jacob Williamson , a 50 y.o. male  was evaluated in triage.  Pt complains of fever and body aches onset yesterday, given Tylenol at [facility] for fever 101.2, no known exposure to meningitis or COVID. No loss of smell or taste, no diarrhea.  Review of Systems  Positive: Fever, body aches, cough, PND Negative: diarrhea  Physical Exam  BP 126/83 (BP Location: Right Arm)   Pulse 97   Temp 99.9 F (37.7 C)   Resp 16   SpO2 98%  Gen:   Awake, no distress   Resp:  Normal effort  MSK:   Moves extremities without difficulty  Other:  No rigidity, able to touch chin to chest  Medical Decision Making  Medically screening exam initiated at 12:14 PM.  Appropriate orders placed.  Jacob Williamson was informed that the remainder of the evaluation will be completed by another provider, this initial triage assessment does not replace that evaluation, and the importance of remaining in the ED until their evaluation is complete.     Jacob Fend, PA-C 07/01/20 1216    Jacob Fossa, MD 07/02/20 [phone]

## 2020-07-01 NOTE — ED Provider Notes (Signed)
[facility]    CSN: [id number] Arrival date & time: 07/01/20  1015      History   Chief Complaint Chief Complaint  Patient presents with  . Headache  . Neck Pain  . Fatigue  . Generalized Body Aches    HPI Jacob Williamson is a 50 y.o. male.   HPI  Headache: Pt presents with 7/10 headache, neck pain, light sensitivity, fatigue, body aches and fever since yesterday. He reports that he does not get headaches typically. He has not tried anything for symptoms. No vomiting but has had some nausea. No head injury or sick contacts.     Past Medical History:  Diagnosis Date  . Asthma    past hx - not current   . Diverticulosis    sigmoid colon and descending colon  . GERD (gastroesophageal reflux disease)   . Hyperlipidemia    no medications   . Obesity (BMI 35.0-39.9 without comorbidity)     Patient Active Problem List   Diagnosis Date Noted  . Acute right-sided thoracic back pain 12/19/2019  . Vitamin D deficiency 05/07/2019  . Vitamin B12 deficiency 05/07/2019  . IGT (impaired glucose tolerance) 05/07/2019  . Hyperlipidemia 05/07/2019  . Obesity (BMI 35.0-39.9 without comorbidity)   . GERD (gastroesophageal reflux disease)     Past Surgical History:  Procedure Laterality Date  . COLONOSCOPY  05/20/2019       Home Medications    Prior to Admission medications   Medication Sig Start Date End Date Taking? Authorizing Provider  BLACK CURRANT SEED OIL PO Take by mouth daily.    [provider]  cyanocobalamin (,VITAMIN B-12,) 1000 MCG/ML injection Inject 5ml in deltoid once weekly for 4 weeks, then inject 1 ml once a month thereafter 05/07/19   Philip Aspen, Limmie Patricia, MD  Multiple Vitamin (MULTIVITAMIN) tablet Take 1 tablet by mouth daily.    [provider]  nystatin (MYCOSTATIN/NYSTOP) powder Apply 1 application topically 3 (three) times daily. 03/11/20   Philip Aspen, Limmie Patricia, MD  SYRINGE-NEEDLE, DISP, 3 ML (BD  SAFETYGLIDE SYRINGE/NEEDLE) 25G X 1" 3 ML MISC Use for B12 injections 05/07/19   Philip Aspen, Limmie Patricia, MD  tadalafil (CIALIS) 20 MG tablet Take 0.5-1 tablets (10-20 mg total) by mouth every other day as needed for erectile dysfunction. 04/30/20   Philip Aspen, Limmie Patricia, MD  Vitamin D, Ergocalciferol, (DRISDOL) 1.25 MG (50000 UNIT) CAPS capsule Take 50,000 Units by mouth once a week. 08/22/19   [provider]    Family History Family History  Problem Relation Age of Onset  . Hypertension Mother   . Diabetes Mother   . Hypertension Father   . Asthma Father   . Diabetes Maternal Grandmother   . Colon cancer Neg Hx   . Colon polyps Neg Hx   . Esophageal cancer Neg Hx   . Rectal cancer Neg Hx   . Stomach cancer Neg Hx     Social History Social History   Tobacco Use  . Smoking status: Former Games developer  . Smokeless tobacco: Never Used  Vaping Use  . Vaping Use: Never used  Substance Use Topics  . Alcohol use: Yes    Comment: rarely  . Drug use: No     Allergies   Shellfish allergy, Catfish [fish allergy], and Other   Review of Systems Review of Systems  As stated above in HPI Physical Exam Triage Vital Signs ED Triage Vitals  Enc Vitals Group  BP 07/01/20 1114 118/82     Pulse Rate 07/01/20 1114 97     Resp 07/01/20 1114 18     Temp 07/01/20 1114 (!) 101.2 F (38.4 C)     Temp Source 07/01/20 1114 Oral     SpO2 07/01/20 1114 96 %     Weight --      Height --      Head Circumference --      Peak Flow --      Pain Score 07/01/20 1116 7     Pain Loc --      Pain Edu? --      Excl. in GC? --    No data found.  Updated Vital Signs BP 118/82 (BP Location: Left Arm)   Pulse 97   Temp (!) 101.2 F (38.4 C) (Oral)   Resp 18   SpO2 96%   Physical Exam Vitals and nursing note reviewed.  Constitutional:      General: He is not in acute distress.    Appearance: He is well-developed. He is obese. He is ill-appearing. He is not toxic-appearing or  diaphoretic.  HENT:     Head: Normocephalic and atraumatic.     Mouth/Throat:     Mouth: Mucous membranes are moist.  Eyes:     General: No visual field deficit.    Extraocular Movements: Extraocular movements intact.     Right eye: No nystagmus.     Left eye: No nystagmus.     Pupils: Pupils are equal, round, and reactive to light.     Right eye: Pupil is round and reactive.     Left eye: Pupil is round and reactive.     Comments: Mild photophobia  Neck:     Meningeal: Brudzinski's sign (Pt expresses pain) and Kernig's sign absent.  Cardiovascular:     Rate and Rhythm: Normal rate and regular rhythm.     Heart sounds: Normal heart sounds.  Pulmonary:     Effort: Pulmonary effort is normal.     Breath sounds: Normal breath sounds.  Abdominal:     General: Bowel sounds are normal.     Palpations: Abdomen is soft.  Musculoskeletal:     Cervical back: Rigidity present.  Lymphadenopathy:     Cervical: No cervical adenopathy.  Skin:    General: Skin is warm.  Neurological:     Mental Status: He is alert and oriented to person, place, and time.     Cranial Nerves: No cranial nerve deficit or facial asymmetry.     Sensory: No sensory deficit.     Motor: No weakness.     Deep Tendon Reflexes: Reflexes normal.      UC Treatments / Results  Labs (all labs ordered are listed, but only abnormal results are displayed) Labs Reviewed - No data to display  EKG   Radiology No results found.  Procedures Procedures (including critical care time)  Medications Ordered in UC Medications  acetaminophen (TYLENOL) tablet 650 mg (650 mg Oral Given 07/01/20 1120)    Initial Impression / Assessment and Plan / UC Course  I have reviewed the triage vital signs and the nursing notes.  Pertinent labs & imaging results that were available during my care of the patient were reviewed by me and considered in my medical decision making (see chart for details).     New. There is concern  for meninginitis which I discussed with patient. I have recommended that he be evaluated in the emergency room  for further work up to prevent potential morbidity and mortality. Pt agreeable and he wishes to walk across the parking lot to the ED.  Final Clinical Impressions(s) / UC Diagnoses   Final diagnoses:  None   Discharge Instructions   None    ED Prescriptions    None     PDMP not reviewed this encounter.   Rushie Chestnut, [place] 07/01/20 1138

## 2020-07-01 NOTE — ED Provider Notes (Signed)
[facility] EMERGENCY DEPARTMENT Provider Note   CSN: [id number] Arrival date & time: 07/01/20  1143     History No chief complaint on file.   Jacob Williamson is a 50 y.o. male.  The history is provided by the patient and medical records.   Jacob Williamson is a 50 y.o. male who presents to the Emergency Department complaining of HA.  Jacob Williamson started feeling poorly yesterday with HA, bad taste in mouth, body aches.  Has fever. Has associated postnasal drip, cough, nausea. No vomiting or diarrhea. Post nasal drip, cough, nausea.  No vomiting/diarrhea.    Has HTN. No tobacco, occasional alcohol, no street drugs.  Has not been vaccinated for COVID 19.        Past Medical History:  Diagnosis Date  . Asthma    past hx - not current   . Diverticulosis    sigmoid colon and descending colon  . GERD (gastroesophageal reflux disease)   . Hyperlipidemia    no medications   . Obesity (BMI 35.0-39.9 without comorbidity)     Patient Active Problem List   Diagnosis Date Noted  . Acute right-sided thoracic back pain 12/19/2019  . Vitamin D deficiency 05/07/2019  . Vitamin B12 deficiency 05/07/2019  . IGT (impaired glucose tolerance) 05/07/2019  . Hyperlipidemia 05/07/2019  . Obesity (BMI 35.0-39.9 without comorbidity)   . GERD (gastroesophageal reflux disease)     Past Surgical History:  Procedure Laterality Date  . COLONOSCOPY  05/20/2019       Family History  Problem Relation Age of Onset  . Hypertension Mother   . Diabetes Mother   . Hypertension Father   . Asthma Father   . Diabetes Maternal Grandmother   . Colon cancer Neg Hx   . Colon polyps Neg Hx   . Esophageal cancer Neg Hx   . Rectal cancer Neg Hx   . Stomach cancer Neg Hx     Social History   Tobacco Use  . Smoking status: Former Games developer  . Smokeless tobacco: Never Used  Vaping Use  . Vaping Use: Never used  Substance Use Topics  . Alcohol use: Yes    Comment: rarely  .  Drug use: No    Home Medications Prior to Admission medications   Medication Sig Start Date End Date Taking? Authorizing Provider  BLACK CURRANT SEED OIL PO Take by mouth daily.    [provider]  cyanocobalamin (,VITAMIN B-12,) 1000 MCG/ML injection Inject 82ml in deltoid once weekly for 4 weeks, then inject 1 ml once a month thereafter 05/07/19   Philip Aspen, Limmie Patricia, MD  Multiple Vitamin (MULTIVITAMIN) tablet Take 1 tablet by mouth daily.    [provider]  nirmatrelvir/ritonavir EUA (PAXLOVID) TABS Take 3 tablets by mouth 2 (two) times daily for 5 days. Patient GFR is >60 Take nirmatrelvir (150 mg) two tablets twice daily for 5 days and ritonavir (100 mg) one tablet twice daily for 5 days. 07/01/20 07/06/20  Tilden Fossa, MD  nystatin (MYCOSTATIN/NYSTOP) powder Apply 1 application topically 3 (three) times daily. 03/11/20   Philip Aspen, Limmie Patricia, MD  SYRINGE-NEEDLE, DISP, 3 ML (BD SAFETYGLIDE SYRINGE/NEEDLE) 25G X 1" 3 ML MISC Use for B12 injections 05/07/19   Philip Aspen, Limmie Patricia, MD  tadalafil (CIALIS) 20 MG tablet Take 0.5-1 tablets (10-20 mg total) by mouth every other day as needed for erectile dysfunction. 04/30/20   Philip Aspen, Limmie Patricia, MD  Vitamin D, Ergocalciferol, (DRISDOL) 1.25 MG (50000  UNIT) CAPS capsule Take 50,000 Units by mouth once a week. 08/22/19   [provider]    Allergies    Shellfish allergy, Catfish [fish allergy], and Other  Review of Systems   Review of Systems  All other systems reviewed and are negative.   Physical Exam Updated Vital Signs BP (!) 131/97   Pulse 73   Temp 99.9 F (37.7 C)   Resp 16   SpO2 99%   Physical Exam Vitals and nursing note reviewed.  Constitutional:      Appearance: Jacob Williamson is well-developed.  HENT:     Head: Normocephalic and atraumatic.  Cardiovascular:     Rate and Rhythm: Normal rate and regular rhythm.     Heart sounds: No murmur heard.   Pulmonary:     Effort:  Pulmonary effort is normal. No respiratory distress.     Breath sounds: Normal breath sounds.  Abdominal:     Palpations: Abdomen is soft.     Tenderness: There is no abdominal tenderness. There is no guarding or rebound.  Musculoskeletal:        General: No tenderness.     Cervical back: Neck supple.  Skin:    General: Skin is warm and dry.  Neurological:     Mental Status: Jacob Williamson is alert and oriented to person, place, and time.  Psychiatric:        Behavior: Behavior normal.     ED Results / Procedures / Treatments   Labs (all labs ordered are listed, but only abnormal results are displayed) Labs Reviewed  RESP PANEL BY RT-PCR (FLU A&B, COVID) ARPGX2 - Abnormal; Notable for the following components:      Result Value   SARS Coronavirus 2 by RT PCR POSITIVE (*)    All other components within normal limits  CBC - Abnormal; Notable for the following components:   RBC 5.84 (*)    All other components within normal limits  COMPREHENSIVE METABOLIC PANEL - Abnormal; Notable for the following components:   Creatinine, Ser 1.41 (*)    All other components within normal limits    EKG None  Radiology No results found.  Procedures Procedures   Medications Ordered in ED Medications  ibuprofen (ADVIL) tablet 600 mg (has no administration in time range)    ED Course  I have reviewed the triage vital signs and the nursing notes.  Pertinent labs & imaging results that were available during my care of the patient were reviewed by me and considered in my medical decision making (see chart for details).    MDM Rules/Calculators/A&P                         patient here for evaluation of body aches, headache. Jacob Williamson is positive for COVID-19 infection. Jacob Williamson is non-toxic appearing on evaluation. Presentation is not consistent with subarachnoid hemorrhage, meningitis, sepsis, serious bacterial infection. Discussed with patient home care for COVID-19. Jacob Williamson is a candidate for antiviral  medications, will send prescription. Discussed outpatient follow-up and return precautions  Jacob Williamson was evaluated in Emergency Department on 07/01/2020 for the symptoms described in the history of present illness. Jacob Williamson was evaluated in the context of the global COVID-19 pandemic, which necessitated consideration that the patient might be at risk for infection with the SARS-CoV-2 virus that causes COVID-19. Institutional protocols and algorithms that pertain to the evaluation of patients at risk for COVID-19 are in a state of rapid change based on information released  by regulatory bodies including the CDC and federal and state organizations. These policies and algorithms were followed during the patient's care in the ED.  Final Clinical Impression(s) / ED Diagnoses Final diagnoses:  COVID-19 virus infection    Rx / DC Orders ED Discharge Orders         Ordered    nirmatrelvir/ritonavir EUA (PAXLOVID) TABS  2 times daily,   Status:  Discontinued        07/01/20 1625    nirmatrelvir/ritonavir EUA (PAXLOVID) TABS  2 times daily        07/01/20 1626           Tilden Fossa, MD 07/01/20 1628

## 2020-07-01 NOTE — ED Triage Notes (Signed)
Pt here from UC with c/o fever  Neck pain and body aches , pt is able  to move neck

## 2020-07-08 ENCOUNTER — Ambulatory Visit: Payer: BC Managed Care – PPO | Admitting: Internal Medicine

## 2020-08-18 ENCOUNTER — Encounter: Payer: Self-pay | Admitting: Family Medicine

## 2020-08-18 ENCOUNTER — Ambulatory Visit (INDEPENDENT_AMBULATORY_CARE_PROVIDER_SITE_OTHER): Payer: BC Managed Care – PPO | Admitting: Family Medicine

## 2020-08-18 ENCOUNTER — Other Ambulatory Visit: Payer: Self-pay

## 2020-08-18 VITALS — BP 118/78 | HR 79 | Temp 98.1°F | Wt 241.6 lb

## 2020-08-18 DIAGNOSIS — R030 Elevated blood-pressure reading, without diagnosis of hypertension: Secondary | ICD-10-CM | POA: Diagnosis not present

## 2020-08-18 DIAGNOSIS — N529 Male erectile dysfunction, unspecified: Secondary | ICD-10-CM

## 2020-08-18 MED ORDER — SILDENAFIL CITRATE 100 MG PO TABS: 50.0000 mg | ORAL_TABLET | Freq: Every day | ORAL | 11 refills | Status: DC | PRN

## 2020-08-18 NOTE — Progress Notes (Signed)
Established Patient Office Visit  Subjective:  Patient ID: Jacob Williamson, male    DOB: [birth year]  Age: 50 y.o. MRN: [id number]  CC:  Chief Complaint  Patient presents with   Hypertension    HPI Jacob Williamson presents for concerns regarding recent elevated blood pressure.  Has had several readings around 143/92 by his home cuff.  He recalls at least 3 readings around 140/90.  No recent headaches or dizziness.  He has lost a few pounds since he was here in March.  Does not add any salt to his foods.  Non-smoker.  No alcohol use.  Currently not exercising regularly but plans to start back soon.  Family history of hypertension in both parents.  Also relates some issues with erectile dysfunction.  He was given Cialis 20 mg every other day which did not help.  He does have nocturnal erections.  Libido is normal.  He is specifically concerned about low testosterone.  Not checked previously to his knowledge.  Denies any specific new stress issues.  Past Medical History:  Diagnosis Date   Asthma    past hx - not current    Diverticulosis    sigmoid colon and descending colon   GERD (gastroesophageal reflux disease)    Hyperlipidemia    no medications    Obesity (BMI 35.0-39.9 without comorbidity)     Past Surgical History:  Procedure Laterality Date   COLONOSCOPY  05/20/2019    Family History  Problem Relation Age of Onset   Hypertension Mother    Diabetes Mother    Hypertension Father    Asthma Father    Diabetes Maternal Grandmother    Colon cancer Neg Hx    Colon polyps Neg Hx    Esophageal cancer Neg Hx    Rectal cancer Neg Hx    Stomach cancer Neg Hx     Social History   Socioeconomic History   Marital status: Single    Spouse name: Not on file   Number of children: Not on file   Years of education: Not on file   Highest education level: Not on file  Occupational History   Not on file  Tobacco Use   Smoking status: Former    Pack years: 0.00    Smokeless tobacco: Never  Vaping Use   Vaping Use: Never used  Substance and Sexual Activity   Alcohol use: Yes    Comment: rarely   Drug use: No   Sexual activity: Yes  Other Topics Concern   Not on file  Social History Narrative   Not on file   Social Determinants of Health   Financial Resource Strain: Not on file  Food Insecurity: Not on file  Transportation Needs: Not on file  Physical Activity: Not on file  Stress: Not on file  Social Connections: Not on file  Intimate Partner Violence: Not on file    Outpatient Medications Prior to Visit  Medication Sig Dispense Refill   BLACK CURRANT SEED OIL PO Take by mouth daily.     cyanocobalamin (,VITAMIN B-12,) 1000 MCG/ML injection Inject 55ml in deltoid once weekly for 4 weeks, then inject 1 ml once a month thereafter 6 mL 1   Multiple Vitamin (MULTIVITAMIN) tablet Take 1 tablet by mouth daily.     nystatin (MYCOSTATIN/NYSTOP) powder Apply 1 application topically 3 (three) times daily. 15 g 1   SYRINGE-NEEDLE, DISP, 3 ML (BD SAFETYGLIDE SYRINGE/NEEDLE) 25G X 1" 3 ML MISC Use for B12 injections 100  each 11   Vitamin D, Ergocalciferol, (DRISDOL) 1.25 MG (50000 UNIT) CAPS capsule Take 50,000 Units by mouth once a week.     tadalafil (CIALIS) 20 MG tablet Take 0.5-1 tablets (10-20 mg total) by mouth every other day as needed for erectile dysfunction. 5 tablet 11   No facility-administered medications prior to visit.    Allergies  Allergen Reactions   Shellfish Allergy Anaphylaxis, Itching and Swelling   Catfish [Fish Allergy] Itching and Other (See Comments)    Mainly focused on throat and inside mouth, itching    Other     Brazilian nuts    ROS Review of Systems  Constitutional:  Negative for fatigue.  Eyes:  Negative for visual disturbance.  Respiratory:  Negative for cough, chest tightness and shortness of breath.   Cardiovascular:  Negative for chest pain, palpitations and leg swelling.  Neurological:  Negative for  dizziness, syncope, weakness, light-headedness and headaches.     Objective:    Physical Exam Constitutional:      Appearance: He is well-developed.  HENT:     Right Ear: External ear normal.     Left Ear: External ear normal.  Eyes:     Pupils: Pupils are equal, round, and reactive to light.  Neck:     Thyroid: No thyromegaly.  Cardiovascular:     Rate and Rhythm: Normal rate and regular rhythm.  Pulmonary:     Effort: Pulmonary effort is normal. No respiratory distress.     Breath sounds: Normal breath sounds. No wheezing or rales.  Musculoskeletal:     Cervical back: Neck supple.  Neurological:     Mental Status: He is alert.    BP 118/78 (BP Location: Left Arm, Cuff Size: Normal)   Pulse 79   Temp 98.1 F (36.7 C) (Oral)   Wt 241 lb 9.6 oz (109.6 kg)   SpO2 97%   BMI 35.17 kg/m  Wt Readings from Last 3 Encounters:  08/18/20 241 lb 9.6 oz (109.6 kg)  04/30/20 245 lb (111.1 kg)  03/11/20 237 lb 11.2 oz (107.8 kg)     Health Maintenance Due  Topic Date Due   COVID-19 Vaccine (1) Never done   HIV Screening  Never done   Hepatitis C Screening  Never done    There are no preventive care reminders to display for this patient.  Lab Results  Component Value Date   TSH 1.22 05/06/2019   Lab Results  Component Value Date   WBC 5.4 07/01/2020   HGB 15.2 07/01/2020   HCT 48.2 07/01/2020   MCV 82.5 07/01/2020   PLT 209 07/01/2020   Lab Results  Component Value Date   NA 137 07/01/2020   K 3.9 07/01/2020   CO2 25 07/01/2020   GLUCOSE 95 07/01/2020   BUN 12 07/01/2020   CREATININE 1.41 (H) 07/01/2020   BILITOT 0.5 07/01/2020   ALKPHOS 50 07/01/2020   AST 28 07/01/2020   ALT 23 07/01/2020   PROT 6.8 07/01/2020   ALBUMIN 4.0 07/01/2020   CALCIUM 8.9 07/01/2020   ANIONGAP 10 07/01/2020   GFR 74.33 05/06/2019   Lab Results  Component Value Date   CHOL 189 05/06/2019   Lab Results  Component Value Date   HDL 40.00 05/06/2019   Lab Results   Component Value Date   LDLCALC 120 (H) 05/06/2019   Lab Results  Component Value Date   TRIG 145.0 05/06/2019   Lab Results  Component Value Date   CHOLHDL 5 05/06/2019  Lab Results  Component Value Date   HGBA1C 5.8 (A) 08/22/2019      Assessment & Plan:   #1 elevated blood pressure.  Initial reading here today 120/90 but repeat after rest 118/78. -Do not recommend medication at this time but continue nonpharmacologic management with weight loss, regular aerobic exercise minimum 150 minutes/week, keep sodium less than 2400 mg daily -Be in touch if consistent readings greater than 140/90.  #2 erectile dysfunction -Patient requesting testosterone levels.  We explained these need to be early morning.  Future lab order placed.  He will return for that -Consider trial of Viagra 100 mg 1/2 to 1 tablet daily as needed with prescription sent   Meds ordered this encounter  Medications   sildenafil (VIAGRA) 100 MG tablet    Sig: Take 0.5-1 tablets (50-100 mg total) by mouth daily as needed for erectile dysfunction.    Dispense:  6 tablet    Refill:  11    Follow-up: No follow-ups on file.    Evelena Peat, MD

## 2020-08-18 NOTE — Patient Instructions (Signed)
Set up lab for testosterone level.

## 2020-08-20 ENCOUNTER — Other Ambulatory Visit: Payer: Self-pay

## 2020-08-21 ENCOUNTER — Telehealth: Payer: Self-pay | Admitting: Internal Medicine

## 2020-08-21 ENCOUNTER — Other Ambulatory Visit (INDEPENDENT_AMBULATORY_CARE_PROVIDER_SITE_OTHER): Payer: BC Managed Care – PPO

## 2020-08-21 DIAGNOSIS — N529 Male erectile dysfunction, unspecified: Secondary | ICD-10-CM | POA: Diagnosis not present

## 2020-08-21 LAB — TESTOSTERONE: Testosterone: 336.66 ng/dL (ref 300.00–890.00)

## 2020-08-21 NOTE — Telephone Encounter (Signed)
Pt is calling back to get his lab results. 

## 2020-08-21 NOTE — Telephone Encounter (Signed)
See result notes. 

## 2020-08-24 ENCOUNTER — Other Ambulatory Visit: Payer: BC Managed Care – PPO

## 2020-09-17 ENCOUNTER — Encounter: Payer: BC Managed Care – PPO | Admitting: Internal Medicine

## 2020-09-24 ENCOUNTER — Telehealth: Payer: Self-pay

## 2020-09-24 DIAGNOSIS — R935 Abnormal findings on diagnostic imaging of other abdominal regions, including retroperitoneum: Secondary | ICD-10-CM

## 2020-09-24 DIAGNOSIS — K862 Cyst of pancreas: Secondary | ICD-10-CM

## 2020-09-24 NOTE — Telephone Encounter (Signed)
-----   Message from Chrystie Nose, RN sent at 09/24/2019 11:40 AM EDT ----- Regarding: MR ABD W/WO Pt needs follow-up in 1 year

## 2020-09-24 NOTE — Telephone Encounter (Signed)
Imaging orders in epic. Secure staff message sent to radiology schedulers to set up appt.   Lm on vm for patient to return call.

## 2020-09-25 NOTE — Telephone Encounter (Signed)
Lm on vm for patient to return call 

## 2020-09-28 NOTE — Telephone Encounter (Signed)
Lm on vm for patient to return call 

## 2020-09-29 NOTE — Telephone Encounter (Signed)
No return call received. Letter mailed to patient.  

## 2020-11-19 ENCOUNTER — Encounter: Payer: BC Managed Care – PPO | Admitting: Internal Medicine

## 2021-04-28 ENCOUNTER — Ambulatory Visit: Payer: 59 | Admitting: Family Medicine

## 2021-04-28 ENCOUNTER — Ambulatory Visit (INDEPENDENT_AMBULATORY_CARE_PROVIDER_SITE_OTHER): Payer: 59

## 2021-04-28 ENCOUNTER — Other Ambulatory Visit: Payer: Self-pay

## 2021-04-28 ENCOUNTER — Encounter: Payer: Self-pay | Admitting: Family Medicine

## 2021-04-28 VITALS — BP 120/82 | HR 84 | Temp 98.2°F | Ht 69.5 in | Wt 260.1 lb

## 2021-04-28 DIAGNOSIS — M25522 Pain in left elbow: Secondary | ICD-10-CM | POA: Diagnosis not present

## 2021-04-28 DIAGNOSIS — S46312A Strain of muscle, fascia and tendon of triceps, left arm, initial encounter: Secondary | ICD-10-CM | POA: Diagnosis not present

## 2021-04-28 MED ORDER — NAPROXEN 500 MG PO TABS: 500.0000 mg | ORAL_TABLET | Freq: Two times a day (BID) | ORAL | 0 refills | Status: DC

## 2021-04-28 NOTE — Progress Notes (Signed)
? ?Established Patient Office Visit ? ?Subjective:  ?Patient ID: Jacob Williamson, male    DOB: 04-18-1970  Age: 51 y.o. MRN: [id number] ? ?CC:  ?Chief Complaint  ?Patient presents with  ? Elbow Injury  ?  X1 day,   ? ? ?HPI ?Jacob Williamson presents for acute left elbow pain which started last night.  He states he was play wrestling with his 45 year old son and some of his friends but does not recall any injury at that time.  Afterwards, he did several push-ups and noted pain after that.  His pain is posterior left elbow near the distal attachment of the triceps.  Denies any anterior, lateral, or medial elbow pain.  No visible swelling.  No bruising.  He is right-hand dominant.  No neck pain.  Pain worse with forearm extension especially against resistance. ? ?Pain is moderate.  He has not tried any icing or taking any medications for this thus far. ? ?Past Medical History:  ?Diagnosis Date  ? Asthma   ? past hx - not current   ? Diverticulosis   ? sigmoid colon and descending colon  ? GERD (gastroesophageal reflux disease)   ? Hyperlipidemia   ? no medications   ? Obesity (BMI 35.0-39.9 without comorbidity)   ? ? ?Past Surgical History:  ?Procedure Laterality Date  ? COLONOSCOPY  05/20/2019  ? ? ?Family History  ?Problem Relation Age of Onset  ? Hypertension Mother   ? Diabetes Mother   ? Hypertension Father   ? Asthma Father   ? Diabetes Maternal Grandmother   ? Colon cancer Neg Hx   ? Colon polyps Neg Hx   ? Esophageal cancer Neg Hx   ? Rectal cancer Neg Hx   ? Stomach cancer Neg Hx   ? ? ?Social History  ? ?Socioeconomic History  ? Marital status: Single  ?  Spouse name: Not on file  ? Number of children: Not on file  ? Years of education: Not on file  ? Highest education level: Not on file  ?Occupational History  ? Not on file  ?Tobacco Use  ? Smoking status: Former  ? Smokeless tobacco: Never  ?Vaping Use  ? Vaping Use: Never used  ?Substance and Sexual Activity  ? Alcohol use: Yes  ?  Comment: rarely   ? Drug use: No  ? Sexual activity: Yes  ?Other Topics Concern  ? Not on file  ?Social History Narrative  ? Not on file  ? ?Social Determinants of Health  ? ?Financial Resource Strain: Not on file  ?Food Insecurity: Not on file  ?Transportation Needs: Not on file  ?Physical Activity: Not on file  ?Stress: Not on file  ?Social Connections: Not on file  ?Intimate Partner Violence: Not on file  ? ? ?Outpatient Medications Prior to Visit  ?Medication Sig Dispense Refill  ? BLACK CURRANT SEED OIL PO Take by mouth daily.    ? cyanocobalamin (,VITAMIN B-12,) 1000 MCG/ML injection Inject 58ml in deltoid once weekly for 4 weeks, then inject 1 ml once a month thereafter 6 mL 1  ? Multiple Vitamin (MULTIVITAMIN) tablet Take 1 tablet by mouth daily.    ? nystatin (MYCOSTATIN/NYSTOP) powder Apply 1 application topically 3 (three) times daily. 15 g 1  ? sildenafil (VIAGRA) 100 MG tablet Take 0.5-1 tablets (50-100 mg total) by mouth daily as needed for erectile dysfunction. 6 tablet 11  ? SYRINGE-NEEDLE, DISP, 3 ML (BD SAFETYGLIDE SYRINGE/NEEDLE) 25G X 1" 3 ML MISC Use for  B12 injections 100 each 11  ? Vitamin D, Ergocalciferol, (DRISDOL) 1.25 MG (50000 UNIT) CAPS capsule Take 50,000 Units by mouth once a week.    ? ?No facility-administered medications prior to visit.  ? ? ?Allergies  ?Allergen Reactions  ? Shellfish Allergy Anaphylaxis, Itching and Swelling  ? Catfish [Fish Allergy] Itching and Other (See Comments)  ?  Mainly focused on throat and inside mouth, itching ?  ? Other   ?  Brazilian nuts  ? ? ?ROS ?Review of Systems  ?Musculoskeletal:  Negative for neck pain.  ?Neurological:  Negative for weakness and numbness.  ? ?  ?Objective:  ?  ?Physical Exam ?Vitals reviewed.  ?Cardiovascular:  ?   Rate and Rhythm: Normal rate and regular rhythm.  ?Musculoskeletal:  ?   Comments: Left elbow full range of motion.  No erythema.  No ecchymosis.  No warmth.  No localized bony tenderness.  No pain with supination or pronation.  No  proximal radial head tenderness.  He does have some mild tenderness near the distal attachment of the triceps.  He has pain with forearm extension against resistance but not with flexion.  No biceps tenderness.  ?Neurological:  ?   Mental Status: He is alert.  ? ? ?BP 120/82 (BP Location: Left Arm, Patient Position: Sitting, Cuff Size: Normal)   Pulse 84   Temp 98.2 ?F (36.8 ?C) (Oral)   Ht 5' 9.5" (1.765 m)   Wt 260 lb 1.6 oz (118 kg)   SpO2 96%   BMI 37.86 kg/m?  ?Wt Readings from Last 3 Encounters:  ?04/28/21 260 lb 1.6 oz (118 kg)  ?08/18/20 241 lb 9.6 oz (109.6 kg)  ?04/30/20 245 lb (111.1 kg)  ? ? ? ?Health Maintenance Due  ?Topic Date Due  ? COVID-19 Vaccine (1) Never done  ? HIV Screening  Never done  ? Hepatitis C Screening  Never done  ? INFLUENZA VACCINE  Never done  ? ? ?There are no preventive care reminders to display for this patient. ? ?Lab Results  ?Component Value Date  ? TSH 1.22 05/06/2019  ? ?Lab Results  ?Component Value Date  ? WBC 5.4 07/01/2020  ? HGB 15.2 07/01/2020  ? HCT 48.2 07/01/2020  ? MCV 82.5 07/01/2020  ? PLT 209 07/01/2020  ? ?Lab Results  ?Component Value Date  ? NA 137 07/01/2020  ? K 3.9 07/01/2020  ? CO2 25 07/01/2020  ? GLUCOSE 95 07/01/2020  ? BUN 12 07/01/2020  ? CREATININE 1.41 (H) 07/01/2020  ? BILITOT 0.5 07/01/2020  ? ALKPHOS 50 07/01/2020  ? AST 28 07/01/2020  ? ALT 23 07/01/2020  ? PROT 6.8 07/01/2020  ? ALBUMIN 4.0 07/01/2020  ? CALCIUM 8.9 07/01/2020  ? ANIONGAP 10 07/01/2020  ? GFR 74.33 05/06/2019  ? ?Lab Results  ?Component Value Date  ? CHOL 189 05/06/2019  ? ?Lab Results  ?Component Value Date  ? HDL 40.00 05/06/2019  ? ?Lab Results  ?Component Value Date  ? LDLCALC 120 (H) 05/06/2019  ? ?Lab Results  ?Component Value Date  ? TRIG 145.0 05/06/2019  ? ?Lab Results  ?Component Value Date  ? CHOLHDL 5 05/06/2019  ? ?Lab Results  ?Component Value Date  ? HGBA1C 5.8 (A) 08/22/2019  ? ? ?  ?Assessment & Plan:  ? ?Problem List Items Addressed This Visit    ?None ?Visit Diagnoses   ? ? Left elbow pain    -  Primary  ? Relevant Orders  ? DG Elbow Complete  Left  ? ?  ?Suspect left triceps strain. ? ?-Patient would like to get x-ray because of uncertainty of when he may have injured this.  Doubt bony injury but will obtain x-ray to further assess ?-Recommend trial of icing 15 to 20 minutes 2-3 times daily ?-Naproxen 500 mg 1 tablet by mouth twice daily with food ?-Avoid push-ups and other repetitive stress loads to triceps at this time ?-Could consider topical diclofenac gel 2-3 times daily ?-He is aware this may take several weeks to fully heal ? ?Meds ordered this encounter  ?Medications  ? naproxen (NAPROSYN) 500 MG tablet  ?  Sig: Take 1 tablet (500 mg total) by mouth 2 (two) times daily with a meal.  ?  Dispense:  30 tablet  ?  Refill:  0  ? ? ?Follow-up: No follow-ups on file.  ? ? ?Evelena PeatBruce Vernon Ariel, MD ?

## 2021-04-28 NOTE — Patient Instructions (Signed)
Consider OTC topical Diclofenac/Voltaren gel 3 times daily ? ?Ice 15-20 minutes 2-3 times daily   ?

## 2021-04-29 ENCOUNTER — Telehealth: Payer: Self-pay | Admitting: Internal Medicine

## 2021-04-29 NOTE — Telephone Encounter (Signed)
Patient called in stating that he was seen yesterday by Dr.Burchette about his elbow pain. 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 ? ?Patient is requesting a note for him to be out of work for yesterday 03/15 and today 03/16. ? ?Patient is also requesting to speak to someone regarding his xray results. Informed patient that someone would reach out to him with his results when they receive and look over them. ? ?Please advise. ?

## 2021-04-30 ENCOUNTER — Ambulatory Visit: Payer: 59 | Admitting: Family Medicine

## 2021-04-30 ENCOUNTER — Encounter: Payer: Self-pay | Admitting: Family Medicine

## 2021-04-30 VITALS — BP 120/78 | HR 71 | Temp 98.1°F | Ht 69.5 in | Wt 262.0 lb

## 2021-04-30 DIAGNOSIS — M25522 Pain in left elbow: Secondary | ICD-10-CM

## 2021-04-30 NOTE — Progress Notes (Addendum)
? ?Established Patient Office Visit ? ?Subjective:  ?Patient ID: Jacob Williamson, male    DOB: 12-21-1970  Age: 51 y.o. MRN: [id number] ? ?CC:  ?Chief Complaint  ?Patient presents with  ? Follow-up  ? ? ?HPI ?Jacob Williamson presents for persistent and possibly worsening left elbow pain.  He was seen couple days ago.  He noticed pain posterior aspect left elbow after playing wrestling with his son and doing about 30 push-ups.  He did recall pain afterwards.  We obtained x-ray which showed some enthesopathic changes olecranon process but no acute bony abnormality.  He denied any chronic left elbow pain.  He is right-hand dominant.  He had called back stating that he had difficulty lifting his arm.  Pain with minimal movement of the elbow.  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  No neck pain. ?Taking naproxen 500 g twice daily with food and has done some icing ? ?Past Medical History:  ?Diagnosis Date  ? Asthma   ? past hx - not current   ? Diverticulosis   ? sigmoid colon and descending colon  ? GERD (gastroesophageal reflux disease)   ? Hyperlipidemia   ? no medications   ? Obesity (BMI 35.0-39.9 without comorbidity)   ? ? ?Past Surgical History:  ?Procedure Laterality Date  ? COLONOSCOPY  05/20/2019  ? ? ?Family History  ?Problem Relation Age of Onset  ? Hypertension Mother   ? Diabetes Mother   ? Hypertension Father   ? Asthma Father   ? Diabetes Maternal Grandmother   ? Colon cancer Neg Hx   ? Colon polyps Neg Hx   ? Esophageal cancer Neg Hx   ? Rectal cancer Neg Hx   ? Stomach cancer Neg Hx   ? ? ?Social History  ? ?Socioeconomic History  ? Marital status: Single  ?  Spouse name: Not on file  ? Number of children: Not on file  ? Years of education: Not on file  ? Highest education level: Not on file  ?Occupational History  ? Not on file  ?Tobacco Use  ? Smoking status: Former  ? Smokeless tobacco: Never  ?Vaping Use  ? Vaping Use: Never used  ?Substance and Sexual  Activity  ? Alcohol use: Yes  ?  Comment: rarely  ? Drug use: No  ? Sexual activity: Yes  ?Other Topics Concern  ? Not on file  ?Social History Narrative  ? Not on file  ? ?Social Determinants of Health  ? ?Financial Resource Strain: Not on file  ?Food Insecurity: Not on file  ?Transportation Needs: Not on file  ?Physical Activity: Not on file  ?Stress: Not on file  ?Social Connections: Not on file  ?Intimate Partner Violence: Not on file  ? ? ?Outpatient Medications Prior to Visit  ?Medication Sig Dispense Refill  ? BLACK CURRANT SEED OIL PO Take by mouth daily.    ? cyanocobalamin (,VITAMIN B-12,) 1000 MCG/ML injection Inject 47ml in deltoid once weekly for 4 weeks, then inject 1 ml once a month thereafter 6 mL 1  ? Multiple Vitamin (MULTIVITAMIN) tablet Take 1 tablet by mouth daily.    ? naproxen (NAPROSYN) 500 MG tablet Take 1 tablet (500 mg total) by mouth 2 (two) times daily with a meal. 30 tablet 0  ? nystatin (MYCOSTATIN/NYSTOP) powder Apply 1 application topically 3 (three) times daily. 15 g 1  ? sildenafil (VIAGRA) 100 MG tablet Take 0.5-1 tablets (50-100 mg  total) by mouth daily as needed for erectile dysfunction. 6 tablet 11  ? SYRINGE-NEEDLE, DISP, 3 ML (BD SAFETYGLIDE SYRINGE/NEEDLE) 25G X 1" 3 ML MISC Use for B12 injections 100 each 11  ? Vitamin D, Ergocalciferol, (DRISDOL) 1.25 MG (50000 UNIT) CAPS capsule Take 50,000 Units by mouth once a week.    ? ?No facility-administered medications prior to visit.  ? ? ?Allergies  ?Allergen Reactions  ? Shellfish Allergy Anaphylaxis, Itching and Swelling  ? Catfish [Fish Allergy] Itching and Other (See Comments)  ?  Mainly focused on throat and inside mouth, itching ?  ? Other   ?  Brazilian nuts  ? ? ?ROS ?Review of Systems  ?Musculoskeletal:  Negative for neck pain.  ?Neurological:  Negative for weakness and numbness.  ? ?  ?Objective:  ?  ?Physical Exam ?Vitals reviewed.  ?Cardiovascular:  ?   Rate and Rhythm: Normal rate and regular rhythm.   ?Musculoskeletal:  ?   Comments: Left elbow reveals pain with flexion and extension.  No ecchymosis.  No warmth.  No olecranon edema.  Does have some tenderness around the olecranon process.    ?Neurological:  ?   Mental Status: He is alert.  ? ? ?BP 120/78 (BP Location: Left Arm, Patient Position: Sitting, Cuff Size: Normal)   Pulse 71   Temp 98.1 ?F (36.7 ?C) (Oral)   Ht 5' 9.5" (1.765 m)   Wt 262 lb (118.8 kg)   SpO2 96%   BMI 38.14 kg/m?  ?Wt Readings from Last 3 Encounters:  ?04/30/21 262 lb (118.8 kg)  ?04/28/21 260 lb 1.6 oz (118 kg)  ?08/18/20 241 lb 9.6 oz (109.6 kg)  ? ? ? ?Health Maintenance Due  ?Topic Date Due  ? COVID-19 Vaccine (1) Never done  ? HIV Screening  Never done  ? Hepatitis C Screening  Never done  ? INFLUENZA VACCINE  Never done  ? ? ?There are no preventive care reminders to display for this patient. ? ?Lab Results  ?Component Value Date  ? TSH 1.22 05/06/2019  ? ?Lab Results  ?Component Value Date  ? WBC 5.4 07/01/2020  ? HGB 15.2 07/01/2020  ? HCT 48.2 07/01/2020  ? MCV 82.5 07/01/2020  ? PLT 209 07/01/2020  ? ?Lab Results  ?Component Value Date  ? NA 137 07/01/2020  ? K 3.9 07/01/2020  ? CO2 25 07/01/2020  ? GLUCOSE 95 07/01/2020  ? BUN 12 07/01/2020  ? CREATININE 1.41 (H) 07/01/2020  ? BILITOT 0.5 07/01/2020  ? ALKPHOS 50 07/01/2020  ? AST 28 07/01/2020  ? ALT 23 07/01/2020  ? PROT 6.8 07/01/2020  ? ALBUMIN 4.0 07/01/2020  ? CALCIUM 8.9 07/01/2020  ? ANIONGAP 10 07/01/2020  ? GFR 74.33 05/06/2019  ? ?Lab Results  ?Component Value Date  ? CHOL 189 05/06/2019  ? ?Lab Results  ?Component Value Date  ? HDL 40.00 05/06/2019  ? ?Lab Results  ?Component Value Date  ? LDLCALC 120 (H) 05/06/2019  ? ?Lab Results  ?Component Value Date  ? TRIG 145.0 05/06/2019  ? ?Lab Results  ?Component Value Date  ? CHOLHDL 5 05/06/2019  ? ?Lab Results  ?Component Value Date  ? HGBA1C 5.8 (A) 08/22/2019  ? ? ?  ?Assessment & Plan:  ? ?Problem List Items Addressed This Visit   ?None ?Visit Diagnoses   ? ?  Left elbow pain    -  Primary  ? Relevant Orders  ? Ambulatory referral to Sports Medicine  ? ?  ?Patient has  worsening left elbow pain following push-ups and play wrestling with his 49 year old son.  X-ray showing enthesopathic changes olecranon process we explained these did not relate to any acute injury but suggest more chronic process.  He is relating to acute pain of few days duration. ? ?-Continue icing 15 to 20 minutes 2-3 times daily ?-Continue naproxen 500 mg twice daily with food ?-Work note extended through next week ?-We discussed sports medicine referral and patient would like to proceed with that ?-We did apply 4 inch ace wrap for compression around the area of maximum tenderness.  He felt this did help alleviate some of the acute pain ? ?No orders of the defined types were placed in this encounter. ? ? ?Follow-up: No follow-ups on file.  ? ? ?Evelena PeatBruce Zinia Innocent, MD ?

## 2021-04-30 NOTE — Telephone Encounter (Signed)
I spoke with the pt and he stated that pain has worsened and he has been scheduled for f/u visit today. Letter composed and sent ?

## 2021-04-30 NOTE — Patient Instructions (Signed)
I will put in sports medicine referral ? ?Keep icing 20 minutes 2-3 times daily ? ?Continue the Naproxen.    ?

## 2021-05-04 NOTE — Progress Notes (Signed)
? ? ?Subjective:   ? ?CC: L elbow pain ? ?I, Jacob Williamson, LAT, ATC, am serving as scribe for Dr. Clementeen Graham. ? ?HPI: Pt is a 51 y/o RHD male presenting w/ L elbow pain since 04/27/21 after wrestling w/ his son and doing some push-ups.  He has been seen by his PCP x2 since then.  He locates his pain to the posterior aspect of the L elbow and into the distal triceps . ? ?He has been unable to work since hurting his elbow.  He works in a [employer] where he has to lift and push objects. ? ?Swelling: yes ?UE paresthesias: no- when injury first occurred ?Aggravating factors: elbow ext, pulling shirt off, IR ?Treatments tried: ice, Naproxen, Voltaren gel ? ?Diagnostic testing: L elbow XR- 04/28/21 ? ? ?Pertinent review of Systems: No fevers or chills ? ?Relevant historical information: Hyperlipidemia ?Does have sildenafil listed on his medicine list but is not currently taking it. ? ? ?Objective:   ? ?Vitals:  ? 05/05/21 0857  ?BP: (!) 142/90  ?Pulse: 87  ?SpO2: 96%  ? ?General: Well Developed, well nourished, and in no acute distress.  ? ?MSK: Left elbow: Normal. ?Tender palpation at distal triceps. ?Normal elbow motion pain with elbow flexion. ?Pain with resisted elbow extension  ?Strength to elbow extension is reduced 4+/5. ?Pulses cap refill and sensation are intact distally. ? ?Lab and Radiology Results ? ?Diagnostic Limited MSK Ultrasound of: Left elbow ?Distal triceps visualized. ?No definitive tear visible. ?Calcific change at distal triceps insertion onto olecranon. ?Increased vascular activity on Doppler seen superficial to triceps tendon and within superficial triceps tendon distally. ?Impression: Chronic calcific tendinitis ? ? ?Impression and Recommendations:   ? ?Assessment and Plan: ?51 y.o. male with left triceps tendon pain after doing push-ups in the setting of what I think is chronic calcific tendinitis.  I think he had an exacerbation or injury of his triceps tendon and now has  significant worsening of his tendinitis.  He is improving over the last week but is still very symptomatic and unable to work his physically demanding job.  Plan to treat with physical therapy referral and home exercise program teaching in clinic today by ATC.  We will also use nitroglycerin patch protocol.  Discussed the importance of not using this medication with sildenafil or medications like sildenafil. ?Recheck in 1 month. ?Work note provided out of work for 1 month.. ? ?PDMP not reviewed this encounter. ?Orders Placed This Encounter  ?Procedures  ? [place] LIMITED JOINT SPACE STRUCTURES UP LEFT(NO LINKED CHARGES)  ?  Order Specific Question:   Reason for Exam (SYMPTOM  OR DIAGNOSIS REQUIRED)  ?  Answer:   left elbow pain  ?  Order Specific Question:   Preferred imaging location?  ?  Answer:   Adult nurse [facility]  ? [place] LIMITED JOINT SPACE STRUCTURES UP LEFT(NO LINKED CHARGES)  ?  Order Specific Question:   Reason for Exam (SYMPTOM  OR DIAGNOSIS REQUIRED)  ?  Answer:   lt elbow  ?  Order Specific Question:   Preferred imaging location?  ?  Answer:   Adult nurse [facility]  ? Ambulatory referral to Physical Therapy  ?  Referral Priority:   Routine  ?  Referral Type:   Physical Medicine  ?  Referral Reason:   Specialty Services Required  ?  Requested Specialty:   Physical Therapy  ?  Number of Visits Requested:   1  ? ?Meds ordered this encounter  ?  Medications  ? nitroGLYCERIN (NITRODUR - DOSED IN MG/24 HR) 0.2 mg/hr patch  ?  Sig: Place 1 patch (0.2 mg total) onto the skin daily. Place 1/4 of the patch over the affected area.  ?  Dispense:  30 patch  ?  Refill:  1  ? ? ?Discussed warning signs or symptoms. Please see discharge instructions. Patient expresses understanding. ? ? ?The above documentation has been reviewed and is accurate and complete Clementeen Graham, M.D. ? ?

## 2021-05-05 ENCOUNTER — Ambulatory Visit: Payer: 59 | Admitting: Family Medicine

## 2021-05-05 ENCOUNTER — Ambulatory Visit: Payer: Self-pay

## 2021-05-05 ENCOUNTER — Other Ambulatory Visit: Payer: Self-pay

## 2021-05-05 VITALS — BP 142/90 | HR 87 | Ht 69.5 in | Wt 266.2 lb

## 2021-05-05 DIAGNOSIS — M25522 Pain in left elbow: Secondary | ICD-10-CM | POA: Diagnosis not present

## 2021-05-05 DIAGNOSIS — M778 Other enthesopathies, not elsewhere classified: Secondary | ICD-10-CM | POA: Diagnosis not present

## 2021-05-05 MED ORDER — NITROGLYCERIN 0.2 MG/HR TD PT24: 0.2000 mg | MEDICATED_PATCH | Freq: Every day | TRANSDERMAL | 1 refills | Status: AC

## 2021-05-05 NOTE — Patient Instructions (Addendum)
Thank you for coming in today.  ? ?I've referred you to Physical Therapy.  Let us know if you don't hear from them in one week.  ? ?Please complete the exercises that the athletic trainer went over with you:  View at [email or web address]-[email or web address] using code: [email or web address] ? ? ?Nitroglycerin Protocol ?Apply 1/4 nitroglycerin patch to affected area daily. ?Change position of patch within the affected area every 24 hours. ?You may experience a headache during the first 1-2 weeks of using the patch, these should subside. ?If you experience headaches after beginning nitroglycerin patch treatment, you may take your preferred over the counter pain reliever. ?Another side effect of the nitroglycerin patch is skin irritation or rash related to patch adhesive. ?Please notify our office if you develop more severe headaches or rash, and stop the patch. ?Tendon healing with nitroglycerin patch may require 12 to 24 weeks depending on the extent of injury. ?Men should not use if taking Viagra, Cialis, or Levitra.  ?Do not use if you have migraines or rosacea.   ? ?Recheck in 1 month ? ?

## 2021-05-26 ENCOUNTER — Ambulatory Visit: Payer: 59 | Attending: Family Medicine

## 2021-05-26 DIAGNOSIS — M25522 Pain in left elbow: Secondary | ICD-10-CM | POA: Diagnosis present

## 2021-05-26 DIAGNOSIS — M6281 Muscle weakness (generalized): Secondary | ICD-10-CM | POA: Insufficient documentation

## 2021-05-26 NOTE — Therapy (Signed)
?OUTPATIENT PHYSICAL THERAPY ELBOW EVALUATION ? ? ?Patient Name: Jacob Williamson ?MRN: [id number] ?DOB:[birth year], 51 y.o., male ?Today's Date: 05/27/2021 ? ? PT End of Session - 05/26/21 1801   ? ? Visit Number 1   ? Number of Visits 9   ? Date for PT Re-Evaluation 07/21/21   ? Authorization Type UHC   ? PT Start Time [id number]   arrived late  ? PT Stop Time [id number]   ? PT Time Calculation (min) 28 min   ? Activity Tolerance Patient tolerated treatment well   ? Behavior During Therapy [facility] for tasks assessed/performed   ? ?  ?  ? ?  ? ? ?Past Medical History:  ?Diagnosis Date  ? Asthma   ? past hx - not current   ? Diverticulosis   ? sigmoid colon and descending colon  ? GERD (gastroesophageal reflux disease)   ? Hyperlipidemia   ? no medications   ? Obesity (BMI 35.0-39.9 without comorbidity)   ? ?Past Surgical History:  ?Procedure Laterality Date  ? COLONOSCOPY  05/20/2019  ? ?Patient Active Problem List  ? Diagnosis Date Noted  ? Tendinitis of left triceps 05/05/2021  ? Acute right-sided thoracic back pain 12/19/2019  ? Vitamin D deficiency 05/07/2019  ? Vitamin B12 deficiency 05/07/2019  ? IGT (impaired glucose tolerance) 05/07/2019  ? Hyperlipidemia 05/07/2019  ? Obesity (BMI 35.0-39.9 without comorbidity)   ? GERD (gastroesophageal reflux disease)   ? ? ?PCP: Isaac Bliss, Rayford Halsted, MD ? ?REFERRING PROVIDER: Gregor Hams, MD ? ?REFERRING DIAG:  ?M25.522 (ICD-10-CM) - Elbow pain, left ? ?THERAPY DIAG:  ?Pain in left elbow ? ?Muscle weakness (generalized) ? ?ONSET DATE: 04/28/2021 - approximate ? ?SUBJECTIVE:                                                                                                                                                                                     ? ?SUBJECTIVE STATEMENT: ?Pt presents to PT with roughly 4 week hx of L elbow pain. He notes that he was doing push ups and felt a sharp increase in L elbow pain the next morning. He states that his pain is worse in morning, also  increases with overhead lifting. 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 Denies paresthesias into L forearm.  ? ?PERTINENT HISTORY: ?None ? ?PAIN:  ?Are you having pain?  ?Yes: NPRS scale: 4/10 - (5/10 at worst) ?Pain location: L olecranon  ?Pain description: sharp ?Aggravating factors: overhead reaching, palpation to elbow ?Relieving factors: rest ? ?PRECAUTIONS: None ? ?WEIGHT BEARING RESTRICTIONS No ? ?FALLS:  ?Has patient fallen in last 6 months? No ? ?  LIVING ENVIRONMENT: ?Lives with: lives with their family ?Lives in: House/apartment ?Stairs:  yes - no barriers ?Has following equipment at home: None ? ?OCCUPATION: ?[employer] ? ?PLOF: Independent and Independent with basic ADLs ? ?PATIENT GOALS: decrease elbow pain to return to work ? ?OBJECTIVE:  ? ?DIAGNOSTIC FINDINGS:  ?N/A ? ?PATIENT SURVEYS:  ?FOTO - will assess next session due to time constraints ? ?COGNITION: ? Overall cognitive status: Within functional limits for tasks assessed ?    ?SENSATION: ?WFL ? ?POSTURE: ?Medium body habitus ? ?UPPER EXTREMITY ROM:  ? ?Active ROM Right ?05/27/2021 Left ?05/27/2021  ?Elbow flexion    ?Elbow extension  4 deg lacking  ?(Blank rows = not tested) ? ?UPPER EXTREMITY MMT: ? ?MMT Right ?05/27/2021 Left ?05/27/2021  ?Elbow flexion  5/5  ?Elbow extension  4/5  ?(Blank rows = not tested) ? ?SHOULDER SPECIAL TESTS: ? N/A ? ?JOINT MOBILITY TESTING:  ?N/A ? ?PALPATION:  ?TTP to L olecranon  ?  ?TODAY'S TREATMENT: ?Review of HEP and POC ? ? ?PATIENT EDUCATION: ?Education details: eval findings, continue HEP, POC ?Person educated: Patient ?Education method: Explanation, Demonstration, and Handouts ?Education comprehension: verbalized understanding and returned demonstration ? ? ?HOME EXERCISE PROGRAM: ?HEP from MD to start ? ?ASSESSMENT: ? ?CLINICAL IMPRESSION: ?Patient is a 51 y.o. M who was seen today for physical therapy evaluation and treatment for acute on chronic L elbow pain.  Physical findings are consistent with MD impression, as pt demonstrates L elbow extensor weakness and decreased ROM. He is currently operating below PLOF and would benefit from skilled PT services working on decreasing L elbow pain and improving function.  ? ? ?OBJECTIVE IMPAIRMENTS decreased ROM, decreased strength, and pain.  ? ?ACTIVITY LIMITATIONS cleaning, community activity, and occupation.  ? ?PERSONAL FACTORS Fitness and Time since onset of injury/illness/exacerbation are also affecting patient's functional outcome.  ? ? ?REHAB POTENTIAL: Excellent ? ?CLINICAL DECISION MAKING: Stable/uncomplicated ? ?EVALUATION COMPLEXITY: Low ? ? ?GOALS: ?Goals reviewed with patient? No ? ?SHORT TERM GOALS: Target date: 06/17/2021 ? ?Pt will be compliant and knowledgeable with initial HEP for improved comfort and carryover ?Baseline: initial HEP given  ?Goal status: INITIAL ? ?2.  Pt will self report L elbow pain no greater than 1/10 for improved comfort and functional ability ?Baseline: 5/10 at worst ?Goal status: INITIAL ? ?LONG TERM GOALS: Target date: 07/22/2021 ? ?Pt will improve FOTO function score to no less than predicted as proxy for functional improvement ?Baseline: will assess next session ?Goal status: INITIAL ? ?2.  Pt will improve L elbow to full extension with no lag in order to improve functional ability with work and home ADLs ?Baseline: lacking 4 deg ?Goal status: INITIAL ? ?3.  Pt will be able and pull 50# sled with no increase in pain for improved functional ability with work duties ?Baseline: unable ?Goal status: INITIAL ? ?4.  Pt will be able to reach overhead not limited by L elbow pain  ?Baseline: unable ?Goal status: INITIAL ? ?PLAN: ?PT FREQUENCY: 1x/week ? ?PT DURATION: 8 weeks ? ?PLANNED INTERVENTIONS: Therapeutic exercises, Therapeutic activity, Neuromuscular re-education, Balance training, Gait training, Patient/Family education, Joint mobilization, Dry Needling, Electrical stimulation, Taping,  Vasopneumatic device, and Manual therapy ? ?PLAN FOR NEXT SESSION: take FOTO, assess HEP response, progress as able ? ? ?Ward Chatters, PT ?05/27/2021, 9:48 AM  ?

## 2021-06-02 ENCOUNTER — Ambulatory Visit: Payer: 59

## 2021-06-02 DIAGNOSIS — M6281 Muscle weakness (generalized): Secondary | ICD-10-CM

## 2021-06-02 DIAGNOSIS — M25522 Pain in left elbow: Secondary | ICD-10-CM | POA: Diagnosis not present

## 2021-06-02 NOTE — Therapy (Signed)
?OUTPATIENT PHYSICAL THERAPY TREATMENT NOTE ? ? ?Patient Name: Jacob Williamson ?MRN: [id number] ?DOB:[birth year], 51 y.o., male ?Today's Date: 06/02/2021 ? ?PCP: Isaac Bliss, Rayford Halsted, MD ?REFERRING PROVIDER: Gregor Hams, MD ? ?END OF SESSION:  ? PT End of Session - 06/02/21 1753   ? ? Visit Number 2   ? Number of Visits 9   ? Date for PT Re-Evaluation 07/21/21   ? Authorization Type UHC   ? PT Start Time [id number]   Pt arrive 8 mins late  ? PT Stop Time [id number]   ? PT Time Calculation (min) 37 min   ? Activity Tolerance Patient tolerated treatment well   ? Behavior During Therapy [facility] for tasks assessed/performed   ? ?  ?  ? ?  ? ? ?Past Medical History:  ?Diagnosis Date  ? Asthma   ? past hx - not current   ? Diverticulosis   ? sigmoid colon and descending colon  ? GERD (gastroesophageal reflux disease)   ? Hyperlipidemia   ? no medications   ? Obesity (BMI 35.0-39.9 without comorbidity)   ? ?Past Surgical History:  ?Procedure Laterality Date  ? COLONOSCOPY  05/20/2019  ? ?Patient Active Problem List  ? Diagnosis Date Noted  ? Tendinitis of left triceps 05/05/2021  ? Acute right-sided thoracic back pain 12/19/2019  ? Vitamin D deficiency 05/07/2019  ? Vitamin B12 deficiency 05/07/2019  ? IGT (impaired glucose tolerance) 05/07/2019  ? Hyperlipidemia 05/07/2019  ? Obesity (BMI 35.0-39.9 without comorbidity)   ? GERD (gastroesophageal reflux disease)   ? ? ?REFERRING DIAG: M25.522 (ICD-10-CM) - Elbow pain, left ? ?THERAPY DIAG:  ?Pain in left elbow ? ?Muscle weakness (generalized) ? ?PERTINENT HISTORY: None ? ?PRECAUTIONS: None ? ?ONSET DATE: 04/28/2021 - approximate ? ?SUBJECTIVE: It was really sore this morning but has felt better throughout the day. ? ?PAIN:  ?Are you having pain? Yes ?NPRS scale: 5/10 ?Pain location: L olecranon  ?Pain description: sharp ?Aggravating factors: overhead reaching, palpation to elbow ?Relieving factors: rest ? ? ?OBJECTIVE:  ?  ?DIAGNOSTIC FINDINGS:  ?N/A ?  ?PATIENT SURVEYS:  ?FOTO -  will assess next session due to time constraints ?06/02/2021: 53% (predicted 70%) ?  ?COGNITION: ?          Overall cognitive status: Within functional limits for tasks assessed ?                                 ?SENSATION: ?WFL ?  ?POSTURE: ?Medium body habitus ?  ?UPPER EXTREMITY ROM:  ?  ?Active ROM Right ?05/27/2021 Left ?05/27/2021  ?Elbow flexion      ?Elbow extension   4 deg lacking  ?(Blank rows = not tested) ?  ?UPPER EXTREMITY MMT: ?  ?MMT Right ?05/27/2021 Left ?05/27/2021  ?Elbow flexion   5/5  ?Elbow extension   4/5  ?(Blank rows = not tested) ?  ?SHOULDER SPECIAL TESTS: ?           N/A ?  ?JOINT MOBILITY TESTING:  ?N/A ?  ?PALPATION:  ?TTP to L olecranon  ?            ?TODAY'S TREATMENT: ?[place] [facility]:  DATE: 06/02/2021 ?Therapeutic Exercise: ?UBE forward/retro level 1 x3/3 forward/retro ?Tricep push downs GTB 3x10 ?Pball roll up wall with Lt lift off 2x10 ?Wall push ups x10 ?Sled push/pull 20# 2x20 ft. ?Therapeutic Activity: ?Administration and discussion of FOTO ? ? ?05/27/2021: ?Review of HEP and POC ?  ?  ?PATIENT EDUCATION: ?Education details: eval findings, continue HEP, POC ?Person educated: Patient ?Education method: Explanation, Demonstration, and Handouts ?Education comprehension: verbalized understanding and returned demonstration ?  ?  ?HOME EXERCISE PROGRAM: ?HEP from MD to start ?  ?ASSESSMENT: ?  ?CLINICAL IMPRESSION: ?Patient presents to PT with moderate pain in his L elbow and reports HEP compliance. Session today focused on increasing strength in elbow extensors as well as administration and discussion of FOTO results. Patient was able to tolerate all prescribed exercises with no adverse effects. Patient continues to benefit from skilled PT services and should be progressed as able to improve functional independence. ?  ?  ?OBJECTIVE IMPAIRMENTS decreased ROM, decreased strength, and pain.  ?  ?ACTIVITY LIMITATIONS cleaning, community  activity, and occupation.  ?  ?PERSONAL FACTORS Fitness and Time since onset of injury/illness/exacerbation are also affecting patient's functional outcome.  ?  ?  ?REHAB POTENTIAL: Excellent ?  ?CLINICAL DECISION MAKING: Stable/uncomplicated ?  ?EVALUATION COMPLEXITY: Low ?  ?  ?GOALS: ?Goals reviewed with patient? No ?  ?SHORT TERM GOALS: Target date: 06/17/2021 ?  ?Pt will be compliant and knowledgeable with initial HEP for improved comfort and carryover ?Baseline: initial HEP given  ?Goal status: INITIAL ?  ?2.  Pt will self report L elbow pain no greater than 1/10 for improved comfort and functional ability ?Baseline: 5/10 at worst ?Goal status: INITIAL ?  ?LONG TERM GOALS: Target date: 07/22/2021 ?  ?Pt will improve FOTO function score to no less than predicted as proxy for functional improvement ?Baseline: will assess next session 06/02/2021: 52% with 70% predicated ?Goal status: INITIAL ?  ?2.  Pt will improve L elbow to full extension with no lag in order to improve functional ability with work and home ADLs ?Baseline: lacking 4 deg ?Goal status: INITIAL ?  ?3.  Pt will be able and pull 50# sled with no increase in pain for improved functional ability with work duties ?Baseline: unable ?Goal status: INITIAL ?  ?4.  Pt will be able to reach overhead not limited by L elbow pain  ?Baseline: unable ?Goal status: INITIAL ?  ?PLAN: ?PT FREQUENCY: 1x/week ?  ?PT DURATION: 8 weeks ?  ?PLANNED INTERVENTIONS: Therapeutic exercises, Therapeutic activity, Neuromuscular re-education, Balance training, Gait training, Patient/Family education, Joint mobilization, Dry Needling, Electrical stimulation, Taping, Vasopneumatic device, and Manual therapy ?  ?PLAN FOR NEXT SESSION: take FOTO, assess HEP response, progress as able ? ? ? ? ?Evelene Croon, PTA ?06/02/2021, 5:57 PM ? ?  ? ?

## 2021-06-08 NOTE — Progress Notes (Signed)
? ?  I, Philbert Riser, LAT, ATC acting as a scribe for Clementeen Graham, MD. ? ?Jacob Williamson is a 51 y.o. male who presents to [facility] at [facility] today for f/u of L elbow/triceps pain due to an acute flare of chronic calcific tendinitis.  He was last seen by Dr. Denyse Amass on 05/05/21 and was referred to PT of which he's completed 2 visits.  He was also prescribed nitroglycerin patches and was shown a HEP. Today, pt reports L elbow pain varies day-to-day. Pt has been compliant w/ HEP, but has not used the nitro patches regularly due to the side effects of a HA. ? ?Diagnostic testing: L elbow XR- 04/28/21 ? ?Pertinent review of systems: No fevers or chills ? ?Relevant historical information: GERD.  Hyperlipidemia. ? ? ?Exam:  ?BP (!) 148/88   Pulse 92   Ht 5' 9.5" (1.765 m)   Wt 260 lb 3.2 oz (118 kg)   SpO2 98%   BMI 37.87 kg/m?  ?General: Well Developed, well nourished, and in no acute distress.  ? ?MSK: Left elbow: Normal. ?Tender palpation at posterior elbow near olecranon. ?Pain with resisted elbow extension. ? ? ? ?Lab and Radiology Results ?EXAM: ?LEFT ELBOW - COMPLETE 3+ VIEW ?  ?COMPARISON:  None. ?  ?FINDINGS: ?Enthesopathic changes are seen at the external humeral epicondyle. ?Enthesopathic changes are seen at the olecranon process. No ?fractures. No joint effusions. Mild degenerative changes in the ?elbow with a small osteophyte off the anterior ulna. ?  ?IMPRESSION: ?1. Enthesopathic changes off the olecranon process and the external ?humeral epicondyle. ?2. Mild degenerative changes in the elbow. ?3. No fracture or effusion. ?  ?  ?Electronically Signed ?  By: Gerome Sam III M.D. ?  On: 04/30/2021 15:12 ?I, Clementeen Graham, personally (independently) visualized and performed the interpretation of the images attached in this note. ? ? ? ? ?Assessment and Plan: ?51 y.o. male with left elbow pain due to insertional triceps tendinitis.  Plan to continue home exercise program and PT.   Recheck back in 6 weeks.  If not improved consider MRI for PRP or shockwave therapy.  He may be a good candidate for iontophoresis in PT. ? ? ?PDMP not reviewed this encounter. ?Orders Placed This Encounter  ?Procedures  ? [place] LIMITED JOINT SPACE STRUCTURES UP LEFT(NO LINKED CHARGES)  ?  Order Specific Question:   Reason for Exam (SYMPTOM  OR DIAGNOSIS REQUIRED)  ?  Answer:   left elbow pain  ?  Order Specific Question:   Preferred imaging location?  ?  Answer:   Adult nurse [facility]  ? ?No orders of the defined types were placed in this encounter. ? ? ? ?Discussed warning signs or symptoms. Please see discharge instructions. Patient expresses understanding. ? ? ?The above documentation has been reviewed and is accurate and complete Clementeen Graham, M.D. ? ? ?

## 2021-06-09 ENCOUNTER — Ambulatory Visit (INDEPENDENT_AMBULATORY_CARE_PROVIDER_SITE_OTHER): Payer: 59 | Admitting: Family Medicine

## 2021-06-09 ENCOUNTER — Ambulatory Visit: Payer: 59

## 2021-06-09 ENCOUNTER — Ambulatory Visit: Payer: Self-pay

## 2021-06-09 VITALS — BP 148/88 | HR 92 | Ht 69.5 in | Wt 260.2 lb

## 2021-06-09 DIAGNOSIS — M25522 Pain in left elbow: Secondary | ICD-10-CM

## 2021-06-09 DIAGNOSIS — M6281 Muscle weakness (generalized): Secondary | ICD-10-CM

## 2021-06-09 NOTE — Therapy (Signed)
?OUTPATIENT PHYSICAL THERAPY TREATMENT NOTE ? ? ?Patient Name: Jacob Williamson ?MRN: [id number] ?DOB:11/06/1970, 51 y.o., male ?Today's Date: 06/09/2021 ? ?PCP: Philip Aspen, Limmie Patricia, MD ?REFERRING PROVIDER: Rodolph Bong, MD ? ?END OF SESSION:  ? PT End of Session - 06/09/21 1742   ? ? Visit Number 3   ? Number of Visits 9   ? Date for PT Re-Evaluation 07/21/21   ? Authorization Type UHC   ? PT Start Time 1745   ? PT Stop Time 1823   ? PT Time Calculation (min) 38 min   ? Activity Tolerance Patient tolerated treatment well   ? Behavior During Therapy [facility] for tasks assessed/performed   ? ?  ?  ? ?  ? ? ? ?Past Medical History:  ?Diagnosis Date  ? Asthma   ? past hx - not current   ? Diverticulosis   ? sigmoid colon and descending colon  ? GERD (gastroesophageal reflux disease)   ? Hyperlipidemia   ? no medications   ? Obesity (BMI 35.0-39.9 without comorbidity)   ? ?Past Surgical History:  ?Procedure Laterality Date  ? COLONOSCOPY  05/20/2019  ? ?Patient Active Problem List  ? Diagnosis Date Noted  ? Tendinitis of left triceps 05/05/2021  ? Acute right-sided thoracic back pain 12/19/2019  ? Vitamin D deficiency 05/07/2019  ? Vitamin B12 deficiency 05/07/2019  ? IGT (impaired glucose tolerance) 05/07/2019  ? Hyperlipidemia 05/07/2019  ? Obesity (BMI 35.0-39.9 without comorbidity)   ? GERD (gastroesophageal reflux disease)   ? ? ?REFERRING DIAG: M25.522 (ICD-10-CM) - Elbow pain, left ? ?THERAPY DIAG:  ?Pain in left elbow ? ?Muscle weakness (generalized) ? ?PERTINENT HISTORY: None ? ?PRECAUTIONS: None ? ?ONSET DATE: 04/28/2021 - approximate ? ?SUBJECTIVE: ?Pt presents to PT with no reports of pain in L elbow pain. He has been compliant with HEP with no adverse effect and decreased soreness. He is ready to begin PT at this time.  ? ?PAIN:  ?Are you having pain? No ?NPRS scale: 5/10 ?Pain location: L olecranon  ?Pain description: sharp ?Aggravating factors: overhead reaching, palpation to elbow ?Relieving factors:  rest ? ? ?OBJECTIVE:  ?  ?DIAGNOSTIC FINDINGS:  ?N/A ?  ?PATIENT SURVEYS:  ?FOTO - will assess next session due to time constraints ?06/02/2021: 53% (predicted 70%) ?  ?COGNITION: ?          Overall cognitive status: Within functional limits for tasks assessed ?                                 ?SENSATION: ?WFL ?  ?POSTURE: ?Medium body habitus ?  ?UPPER EXTREMITY ROM:  ?  ?Active ROM Right ?05/27/2021 Left ?05/27/2021  ?Elbow flexion      ?Elbow extension   4 deg lacking  ?(Blank rows = not tested) ?  ?UPPER EXTREMITY MMT: ?  ?MMT Right ?05/27/2021 Left ?05/27/2021  ?Elbow flexion   5/5  ?Elbow extension   4/5  ?(Blank rows = not tested) ?  ?SHOULDER SPECIAL TESTS: ?           N/A ?  ?JOINT MOBILITY TESTING:  ?N/A ?  ?PALPATION:  ?TTP to L olecranon  ?            ?TODAY'S TREATMENT: ?[facility] Adult PT Treatment:  DATE: 06/09/2021 ?Therapeutic Exercise: ?UBE forward/retro level 1.o x 4 min while taking subjective ?Tricep pull down 2x10 13# ?Row 2x10 17# ?Farmers carry 10# 2x64ft ?Eccentric elbow curl 2x10 3# ?Serratus punch L 2x10  ?Supine pball flexion with bilateral UE 2x10 ?Tricep stretch 2x30" ?Pball roll up wall with Lt lift off 2x10 ? ?[facility] Adult PT Treatment:                                                DATE: 06/02/2021 ?Therapeutic Exercise: ?UBE forward/retro level 1 x3/3 forward/retro ?Tricep push downs GTB 3x10 ?Pball roll up wall with Lt lift off 2x10 ?Wall push ups x10 ?Sled push/pull 20# 2x20 ft. ?Therapeutic Activity: ?Administration and discussion of FOTO ? ?PATIENT EDUCATION: ?Education details: eval findings, continue HEP, POC ?Person educated: Patient ?Education method: Explanation, Demonstration, and Handouts ?Education comprehension: verbalized understanding and returned demonstration ?  ?  ?HOME EXERCISE PROGRAM: ?HEP from MD to start ?  ?ASSESSMENT: ?  ?CLINICAL IMPRESSION: ?Pt was able to complete all prescribed exercises with no adverse effect or increase  in pain. He demonstrated improved tolerance to interventions today, with increase in activity. Pt is progressing with therapy and will continue to be seen and progressed as able.  ?  ?OBJECTIVE IMPAIRMENTS decreased ROM, decreased strength, and pain.  ?  ?ACTIVITY LIMITATIONS cleaning, community activity, and occupation.  ?  ?PERSONAL FACTORS Fitness and Time since onset of injury/illness/exacerbation are also affecting patient's functional outcome.  ?  ?  ?REHAB POTENTIAL: Excellent ?  ?CLINICAL DECISION MAKING: Stable/uncomplicated ?  ?EVALUATION COMPLEXITY: Low ?  ?  ?GOALS: ?Goals reviewed with patient? No ?  ?SHORT TERM GOALS: Target date: 06/17/2021 ?  ?Pt will be compliant and knowledgeable with initial HEP for improved comfort and carryover ?Baseline: initial HEP given  ?Goal status: INITIAL ?  ?2.  Pt will self report L elbow pain no greater than 1/10 for improved comfort and functional ability ?Baseline: 5/10 at worst ?Goal status: INITIAL ?  ?LONG TERM GOALS: Target date: 07/22/2021 ?  ?Pt will improve FOTO function score to no less than predicted as proxy for functional improvement ?Baseline: will assess next session 06/02/2021: 52% with 70% predicated ?Goal status: INITIAL ?  ?2.  Pt will improve L elbow to full extension with no lag in order to improve functional ability with work and home ADLs ?Baseline: lacking 4 deg ?Goal status: INITIAL ?  ?3.  Pt will be able and pull 50# sled with no increase in pain for improved functional ability with work duties ?Baseline: unable ?Goal status: INITIAL ?  ?4.  Pt will be able to reach overhead not limited by L elbow pain  ?Baseline: unable ?Goal status: INITIAL ?  ?PLAN: ?PT FREQUENCY: 1x/week ?  ?PT DURATION: 8 weeks ?  ?PLANNED INTERVENTIONS: Therapeutic exercises, Therapeutic activity, Neuromuscular re-education, Balance training, Gait training, Patient/Family education, Joint mobilization, Dry Needling, Electrical stimulation, Taping, Vasopneumatic device, and  Manual therapy ?  ?PLAN FOR NEXT SESSION: take FOTO, assess HEP response, progress as able ? ? ? ? ?Eloy End, PT ?06/09/2021, 6:26 PM ? ?  ? ?

## 2021-06-09 NOTE — Patient Instructions (Addendum)
Thank you for coming in today.  ? ?Keep working on the exercises.  ? ?Pt could help.  ? ?Recheck in 6 weeks.  ? ?If not better MRI.  ? ?I can order MRI sooner if needed.  ? ? ?

## 2021-06-16 ENCOUNTER — Ambulatory Visit: Payer: 59

## 2021-06-23 ENCOUNTER — Ambulatory Visit: Payer: 59 | Attending: Family Medicine

## 2021-06-23 DIAGNOSIS — M6281 Muscle weakness (generalized): Secondary | ICD-10-CM | POA: Insufficient documentation

## 2021-06-23 DIAGNOSIS — M25522 Pain in left elbow: Secondary | ICD-10-CM | POA: Diagnosis present

## 2021-06-23 NOTE — Therapy (Signed)
?OUTPATIENT PHYSICAL THERAPY TREATMENT NOTE ? ? ?Patient Name: Jacob Williamson ?MRN: [id number] ?DOB:Jan 31, 1971, 51 y.o., male ?Today's Date: 06/23/2021 ? ?PCP: Philip Aspen, Limmie Patricia, MD ?REFERRING PROVIDER: Rodolph Bong, MD ? ?END OF SESSION:  ? PT End of Session - 06/23/21 1759   ? ? Visit Number 4   ? Number of Visits 9   ? Date for PT Re-Evaluation 07/21/21   ? Authorization Type UHC   ? PT Start Time 1759   ? PT Stop Time 1828   ? PT Time Calculation (min) 29 min   ? Activity Tolerance Patient tolerated treatment well   ? Behavior During Therapy [facility] for tasks assessed/performed   ? ?  ?  ? ?  ? ? ? ? ?Past Medical History:  ?Diagnosis Date  ? Asthma   ? past hx - not current   ? Diverticulosis   ? sigmoid colon and descending colon  ? GERD (gastroesophageal reflux disease)   ? Hyperlipidemia   ? no medications   ? Obesity (BMI 35.0-39.9 without comorbidity)   ? ?Past Surgical History:  ?Procedure Laterality Date  ? COLONOSCOPY  05/20/2019  ? ?Patient Active Problem List  ? Diagnosis Date Noted  ? Tendinitis of left triceps 05/05/2021  ? Acute right-sided thoracic back pain 12/19/2019  ? Vitamin D deficiency 05/07/2019  ? Vitamin B12 deficiency 05/07/2019  ? IGT (impaired glucose tolerance) 05/07/2019  ? Hyperlipidemia 05/07/2019  ? Obesity (BMI 35.0-39.9 without comorbidity)   ? GERD (gastroesophageal reflux disease)   ? ? ?REFERRING DIAG: M25.522 (ICD-10-CM) - Elbow pain, left ? ?THERAPY DIAG:  ?Pain in left elbow ? ?Muscle weakness (generalized) ? ?PERTINENT HISTORY: None ? ?PRECAUTIONS: None ? ?ONSET DATE: 04/28/2021 - approximate ? ?SUBJECTIVE: ?Pt presents to PT with reports of decreased L elbow pain and discomfort. He has been compliant with HEP with no adverse effect. He notes things are going a lot better.  ? ?PAIN:  ?Are you having pain?  ?No NPRS scale: 0/10 ?Pain location: L olecranon  ?Pain description: sharp ?Aggravating factors: overhead reaching, palpation to elbow ?Relieving factors:  rest ? ? ?OBJECTIVE:  ?  ?DIAGNOSTIC FINDINGS:  ?N/A ?  ?PATIENT SURVEYS:  ?FOTO - will assess next session due to time constraints ?06/02/2021: 53% (predicted 70%) ?  ?COGNITION: ?          Overall cognitive status: Within functional limits for tasks assessed ?                                 ?SENSATION: ?WFL ?  ?POSTURE: ?Medium body habitus ?  ?UPPER EXTREMITY ROM:  ?  ?Active ROM Right ?05/27/2021 Left ?05/27/2021  ?Elbow flexion      ?Elbow extension   4 deg lacking  ?(Blank rows = not tested) ?  ?UPPER EXTREMITY MMT: ?  ?MMT Right ?05/27/2021 Left ?05/27/2021  ?Elbow flexion   5/5  ?Elbow extension   4/5  ?(Blank rows = not tested) ?  ?SHOULDER SPECIAL TESTS: ?           N/A ?  ?JOINT MOBILITY TESTING:  ?N/A ?  ?PALPATION:  ?TTP to L olecranon  ?            ?TODAY'S TREATMENT: ?[facility] Adult PT Treatment:  DATE: 06/23/2021 ?Therapeutic Exercise: ?UBE forward/retro level 1.5 x 3 min while taking subjective ?Row black TB 2x15 ?Tricep ext black TB 2x10 ?Farmers carry 15# 2x13985ft ?Eccentric elbow curl 3x10 5# ?Tricep stretch 2x30" ?L shoulder wall slide x 15 - 5 sec hold ? ?[facility]:                                                DATE: 06/09/2021 ?Therapeutic Exercise: ?UBE forward/retro level 1.o x 4 min while taking subjective ?Tricep pull down 2x10 13# ?Row 2x10 17# ?Farmers carry 10# 2x3318ft ?Eccentric elbow curl 2x10 3# ?Serratus punch L 2x10  ?Supine pball flexion with bilateral UE 2x10 ?Tricep stretch 2x30" ?Pball roll up wall with Lt lift off 2x10 ? ?[facility]:                                                DATE: 06/02/2021 ?Therapeutic Exercise: ?UBE forward/retro level 1 x3/3 forward/retro ?Tricep push downs GTB 3x10 ?Pball roll up wall with Lt lift off 2x10 ?Wall push ups x10 ?Sled push/pull 20# 2x20 ft. ?Therapeutic Activity: ?Administration and discussion of FOTO ? ?PATIENT EDUCATION: ?Education details: eval findings, continue HEP,  POC ?Person educated: Patient ?Education method: Explanation, Demonstration, and Handouts ?Education comprehension: verbalized understanding and returned demonstration ?  ?  ?HOME EXERCISE PROGRAM: ?Access Code: [email or web address] ?URL: [email or web address] ?Date: 06/23/2021 ?Prepared by: Edwinna Areolaavid Kaiyon Hynes ? ?Exercises ?- Standing Shoulder Row with Anchored Resistance  - 1 x daily - 7 x weekly - 3 sets - 15 reps - black exercise band hold ?- Standing Tricep Extensions with Resistance  - 1 x daily - 7 x weekly - 3 sets - 10 reps - black exercise band hold ?- Seated Single Arm Bicep Curls Supinated with Dumbbell  - 1 x daily - 7 x weekly - 3 sets - 10 reps - 5lbs with left elbow supported hold ?- Standing Overhead Triceps Stretch  - 1 x daily - 7 x weekly - 3 reps - 30 seconds hold ?- Standing Shoulder Posterior Capsule Stretch  - 1 x daily - 7 x weekly - 3 reps - 30 sec hold ?- Shoulder Flexion Wall Slide with Towel  - 1 x daily - 7 x weekly - 2 sets - 15 reps - 5 sec  hold ?  ?ASSESSMENT: ?  ?CLINICAL IMPRESSION: ?Pt was able to complete all prescribed exercises with improved tolerance and no adverse effect. Therapy continued to progress eccentric elbow loading and gentle stretching of L tricep. PT updated HEP and will see pt in two weeks. Will continue to progress as able per POC.  ?  ?OBJECTIVE IMPAIRMENTS decreased ROM, decreased strength, and pain.  ?  ?ACTIVITY LIMITATIONS cleaning, community activity, and occupation.  ?  ?PERSONAL FACTORS Fitness and Time since onset of injury/illness/exacerbation are also affecting patient's functional outcome.  ?  ?GOALS: ?Goals reviewed with patient? No ?  ?SHORT TERM GOALS: Target date: 06/17/2021 ?  ?Pt will be compliant and knowledgeable with initial HEP for improved comfort and carryover ?Baseline: initial HEP given  ?Goal status: INITIAL ?  ?2.  Pt will self report L elbow pain no greater than 1/10 for improved comfort and functional ability ?Baseline:  5/10 at  worst ?Goal status: INITIAL ?  ?LONG TERM GOALS: Target date: 07/22/2021 ?  ?Pt will improve FOTO function score to no less than predicted as proxy for functional improvement ?Baseline: will assess next session 06/02/2021: 52% with 70% predicated ?Goal status: INITIAL ?  ?2.  Pt will improve L elbow to full extension with no lag in order to improve functional ability with work and home ADLs ?Baseline: lacking 4 deg ?Goal status: INITIAL ?  ?3.  Pt will be able and pull 50# sled with no increase in pain for improved functional ability with work duties ?Baseline: unable ?Goal status: INITIAL ?  ?4.  Pt will be able to reach overhead not limited by L elbow pain  ?Baseline: unable ?Goal status: INITIAL ?  ?PLAN: ?PT FREQUENCY: 1x/week ?  ?PT DURATION: 8 weeks ?  ?PLANNED INTERVENTIONS: Therapeutic exercises, Therapeutic activity, Neuromuscular re-education, Balance training, Gait training, Patient/Family education, Joint mobilization, Dry Needling, Electrical stimulation, Taping, Vasopneumatic device, and Manual therapy ?  ?PLAN FOR NEXT SESSION: take FOTO, assess HEP response, progress as able ? ? ? ? ?Eloy End, PT ?06/23/2021, 6:31 PM ? ?  ? ?

## 2021-07-07 ENCOUNTER — Ambulatory Visit: Payer: 59

## 2021-07-07 NOTE — Therapy (Incomplete)
OUTPATIENT PHYSICAL THERAPY TREATMENT NOTE   Patient Name: Jacob Williamson MRN: [id number] DOB:03-31-1970, 51 y.o., male Today's Date: 07/07/2021  PCP: Philip Aspen, Limmie Patricia, MD REFERRING PROVIDER: Philip Aspen, Estel*  END OF SESSION:       Past Medical History:  Diagnosis Date   Asthma    past hx - not current    Diverticulosis    sigmoid colon and descending colon   GERD (gastroesophageal reflux disease)    Hyperlipidemia    no medications    Obesity (BMI 35.0-39.9 without comorbidity)    Past Surgical History:  Procedure Laterality Date   COLONOSCOPY  05/20/2019   Patient Active Problem List   Diagnosis Date Noted   Tendinitis of left triceps 05/05/2021   Acute right-sided thoracic back pain 12/19/2019   Vitamin D deficiency 05/07/2019   Vitamin B12 deficiency 05/07/2019   IGT (impaired glucose tolerance) 05/07/2019   Hyperlipidemia 05/07/2019   Obesity (BMI 35.0-39.9 without comorbidity)    GERD (gastroesophageal reflux disease)     REFERRING DIAG: M25.522 (ICD-10-CM) - Elbow pain, left  THERAPY DIAG:  No diagnosis found.  PERTINENT HISTORY: None  PRECAUTIONS: None  ONSET DATE: 04/28/2021 - approximate  SUBJECTIVE: ***  PAIN:  Are you having pain?  No NPRS scale: 0/10 Pain location: L olecranon  Pain description: sharp Aggravating factors: overhead reaching, palpation to elbow Relieving factors: rest   OBJECTIVE:    DIAGNOSTIC FINDINGS:  N/A   PATIENT SURVEYS:  FOTO - will assess next session due to time constraints 06/02/2021: 53% (predicted 70%)   COGNITION:           Overall cognitive status: Within functional limits for tasks assessed                                  SENSATION: WFL   POSTURE: Medium body habitus   UPPER EXTREMITY ROM:    Active ROM Right 05/27/2021 Left 05/27/2021  Elbow flexion      Elbow extension   4 deg lacking  (Blank rows = not tested)   UPPER EXTREMITY MMT:   MMT Right 05/27/2021  Left 05/27/2021  Elbow flexion   5/5  Elbow extension   4/5  (Blank rows = not tested)   SHOULDER SPECIAL TESTS:            N/A   JOINT MOBILITY TESTING:  N/A   PALPATION:  TTP to L olecranon              TODAY'S TREATMENT: [facility] Adult PT Treatment:                                                DATE: 07/07/2021 Therapeutic Exercise: UBE forward/retro level 1.5 x 3 min while taking subjective Row black TB 2x15 Tricep ext black TB 2x10 Farmers carry 15# 2x150ft Eccentric elbow curl 3x10 5# Tricep stretch 2x30" L shoulder wall slide x 15 - 5 sec hold  [facility] Adult PT Treatment:                                                DATE: 06/23/2021 Therapeutic Exercise: UBE forward/retro level 1.5 x  3 min while taking subjective Row black TB 2x15 Tricep ext black TB 2x10 Farmers carry 15# 2x143ft Eccentric elbow curl 3x10 5# Tricep stretch 2x30" L shoulder wall slide x 15 - 5 sec hold  [facility] Adult PT Treatment:                                                DATE: 06/09/2021 Therapeutic Exercise: UBE forward/retro level 1.o x 4 min while taking subjective Tricep pull down 2x10 13# Row 2x10 17# Farmers carry 10# 2x45ft Eccentric elbow curl 2x10 3# Serratus punch L 2x10  Supine pball flexion with bilateral UE 2x10 Tricep stretch 2x30" Pball roll up wall with Lt lift off 2x10  PATIENT EDUCATION: Education details: eval findings, continue HEP, POC Person educated: Patient Education method: Explanation, Demonstration, and Handouts Education comprehension: verbalized understanding and returned demonstration     HOME EXERCISE PROGRAM: Access Code: [email or web address] URL: [email or web address] Date: 06/23/2021 Prepared by: Edwinna Areola  Exercises - Standing Shoulder Row with Anchored Resistance  - 1 x daily - 7 x weekly - 3 sets - 15 reps - black exercise band hold - Standing Tricep Extensions with Resistance  - 1 x daily - 7 x weekly - 3 sets - 10 reps - black exercise band  hold - Seated Single Arm Bicep Curls Supinated with Dumbbell  - 1 x daily - 7 x weekly - 3 sets - 10 reps - 5lbs with left elbow supported hold - Standing Overhead Triceps Stretch  - 1 x daily - 7 x weekly - 3 reps - 30 seconds hold - Standing Shoulder Posterior Capsule Stretch  - 1 x daily - 7 x weekly - 3 reps - 30 sec hold - Shoulder Flexion Wall Slide with Towel  - 1 x daily - 7 x weekly - 2 sets - 15 reps - 5 sec  hold   ASSESSMENT:   CLINICAL IMPRESSION: ***   OBJECTIVE IMPAIRMENTS decreased ROM, decreased strength, and pain.    ACTIVITY LIMITATIONS cleaning, community activity, and occupation.    PERSONAL FACTORS Fitness and Time since onset of injury/illness/exacerbation are also affecting patient's functional outcome.    GOALS: Goals reviewed with patient? No   SHORT TERM GOALS: Target date: 06/17/2021   Pt will be compliant and knowledgeable with initial HEP for improved comfort and carryover Baseline: initial HEP given  Goal status: INITIAL   2.  Pt will self report L elbow pain no greater than 1/10 for improved comfort and functional ability Baseline: 5/10 at worst Goal status: INITIAL   LONG TERM GOALS: Target date: 07/22/2021   Pt will improve FOTO function score to no less than predicted as proxy for functional improvement Baseline: will assess next session 06/02/2021: 52% with 70% predicated Goal status: INITIAL   2.  Pt will improve L elbow to full extension with no lag in order to improve functional ability with work and home ADLs Baseline: lacking 4 deg Goal status: INITIAL   3.  Pt will be able and pull 50# sled with no increase in pain for improved functional ability with work duties Baseline: unable Goal status: INITIAL   4.  Pt will be able to reach overhead not limited by L elbow pain  Baseline: unable Goal status: INITIAL   PLAN: PT FREQUENCY: 1x/week   PT DURATION: 8 weeks   PLANNED INTERVENTIONS: Therapeutic  exercises, Therapeutic activity,  Neuromuscular re-education, Balance training, Gait training, Patient/Family education, Joint mobilization, Dry Needling, Electrical stimulation, Taping, Vasopneumatic device, and Manual therapy   PLAN FOR NEXT SESSION: take FOTO, assess HEP response, progress as able   Eloy Endavid C Harold Moncus, PT 07/07/2021, 10:39 AM

## 2021-07-20 ENCOUNTER — Ambulatory Visit: Payer: 59 | Attending: Family Medicine

## 2021-07-20 DIAGNOSIS — M6281 Muscle weakness (generalized): Secondary | ICD-10-CM | POA: Diagnosis present

## 2021-07-20 DIAGNOSIS — M25522 Pain in left elbow: Secondary | ICD-10-CM | POA: Diagnosis present

## 2021-07-20 NOTE — Therapy (Signed)
OUTPATIENT PHYSICAL THERAPY TREATMENT NOTE/DISCHARGE  PHYSICAL THERAPY DISCHARGE SUMMARY  Visits from Start of Care: 5  Current functional level related to goals / functional outcomes: See goals and objective   Remaining deficits: See goals and objective   Education / Equipment: HEP   Patient agrees to discharge. Patient goals were met. Patient is being discharged due to being pleased with the current functional level.   Patient Name: Jacob Williamson MRN: [id number] DOB:09/06/1970, 51 y.o., male Today's Date: 07/20/2021  PCP: Isaac Bliss, Rayford Halsted, MD REFERRING PROVIDER: Isaac Bliss, Estel*  END OF SESSION:   PT End of Session - 07/20/21 1706     Visit Number 5    Number of Visits 9    Date for PT Re-Evaluation 07/21/21    Authorization Type [employer]    PT Start Time 4496    PT Stop Time 7591    PT Time Calculation (min) 36 min    Activity Tolerance Patient tolerated treatment well    Behavior During Therapy WFL for tasks assessed/performed                Past Medical History:  Diagnosis Date   Asthma    past hx - not current    Diverticulosis    sigmoid colon and descending colon   GERD (gastroesophageal reflux disease)    Hyperlipidemia    no medications    Obesity (BMI 35.0-39.9 without comorbidity)    Past Surgical History:  Procedure Laterality Date   COLONOSCOPY  05/20/2019   Patient Active Problem List   Diagnosis Date Noted   Tendinitis of left triceps 05/05/2021   Acute right-sided thoracic back pain 12/19/2019   Vitamin D deficiency 05/07/2019   Vitamin B12 deficiency 05/07/2019   IGT (impaired glucose tolerance) 05/07/2019   Hyperlipidemia 05/07/2019   Obesity (BMI 35.0-39.9 without comorbidity)    GERD (gastroesophageal reflux disease)     REFERRING DIAG: M25.522 (ICD-10-CM) - Elbow pain, left  THERAPY DIAG:  Pain in left elbow  Muscle weakness (generalized)  PERTINENT HISTORY: None  PRECAUTIONS: None  ONSET  DATE: 04/28/2021 - approximate  SUBJECTIVE: Pt presents to PT with reports of decreased L elbow pain and discomfort. He has been compliant with HEP with no adverse effect. He notes things are going a lot better.   PAIN:  Are you having pain? No (currently) No NPRS scale: 0/10 Pain location: L olecranon  Pain description: sharp Aggravating factors: overhead reaching, palpation to elbow Relieving factors: rest   OBJECTIVE:    DIAGNOSTIC FINDINGS:  N/A   PATIENT SURVEYS:  FOTO - will assess next session due to time constraints 06/02/2021: 53% (predicted 70%)   COGNITION:           Overall cognitive status: Within functional limits for tasks assessed                                  SENSATION: WFL   POSTURE: Medium body habitus   UPPER EXTREMITY ROM:    Active ROM Right 05/27/2021 Left 05/27/2021  Elbow flexion      Elbow extension   4 deg lacking  (Blank rows = not tested)   UPPER EXTREMITY MMT:   MMT Right 05/27/2021 Left 05/27/2021  Elbow flexion   5/5  Elbow extension   4/5  (Blank rows = not tested)   SHOULDER SPECIAL TESTS:  N/A   JOINT MOBILITY TESTING:  N/A   PALPATION:  TTP to L olecranon              TODAY'S TREATMENT: [facility] Adult PT Treatment:                                                DATE: 07/20/2021 Therapeutic Exercise: UBE level 2 x 4 mins (2 fwd/retro) Sled push/pull 55# 2x20 ft' (no pain) Therapeutic Activity: Checking and discussing goals and HEP for DC, administration of FOTO   [facility] Adult PT Treatment:                                                DATE: 06/23/2021 Therapeutic Exercise: UBE forward/retro level 1.5 x 3 min while taking subjective Row black TB 2x15 Tricep ext black TB 2x10 Farmers carry 15# 2x135f Eccentric elbow curl 3x10 5# Tricep stretch 2x30" L shoulder wall slide x 15 - 5 sec hold  [facility] Adult PT Treatment:                                                DATE: 06/09/2021 Therapeutic Exercise: UBE  forward/retro level 1.o x 4 min while taking subjective Tricep pull down 2x10 13# Row 2x10 17# Farmers carry 10# 2x126fEccentric elbow curl 2x10 3# Serratus punch L 2x10  Supine pball flexion with bilateral UE 2x10 Tricep stretch 2x30" Pball roll up wall with Lt lift off 2x10   PATIENT EDUCATION: Education details: eval findings, continue HEP, POC Person educated: Patient Education method: Explanation, Demonstration, and Handouts Education comprehension: verbalized understanding and returned demonstration     HOME EXERCISE PROGRAM: Access Code: [email or web address] URL: [email or web address] Date: 06/23/2021 Prepared by: DaOctavio MannsExercises - Standing Shoulder Row with Anchored Resistance  - 1 x daily - 7 x weekly - 3 sets - 15 reps - black exercise band hold - Standing Tricep Extensions with Resistance  - 1 x daily - 7 x weekly - 3 sets - 10 reps - black exercise band hold - Seated Single Arm Bicep Curls Supinated with Dumbbell  - 1 x daily - 7 x weekly - 3 sets - 10 reps - 5lbs with left elbow supported hold - Standing Overhead Triceps Stretch  - 1 x daily - 7 x weekly - 3 reps - 30 seconds hold - Standing Shoulder Posterior Capsule Stretch  - 1 x daily - 7 x weekly - 3 reps - 30 sec hold - Shoulder Flexion Wall Slide with Towel  - 1 x daily - 7 x weekly - 2 sets - 15 reps - 5 sec  hold   ASSESSMENT:   CLINICAL IMPRESSION: Patient reports that he hasn't had any pain lately but has occasional muscle soreness. He reports occasional HEP compliance lately, though. He feels like he is ready to go back to work. He has met all of his STG and LTG. He is appropriate for discharge from PT at this time.    OBJECTIVE IMPAIRMENTS decreased ROM, decreased strength, and pain.    ACTIVITY LIMITATIONS cleaning, community activity, and occupation.  PERSONAL FACTORS Fitness and Time since onset of injury/illness/exacerbation are also affecting patient's functional outcome.     GOALS: Goals reviewed with patient? No   SHORT TERM GOALS: Target date: 06/17/2021   Pt will be compliant and knowledgeable with initial HEP for improved comfort and carryover Baseline: initial HEP given  Goal status: MET   2.  Pt will self report L elbow pain no greater than 1/10 for improved comfort and functional ability Baseline: 5/10 at worst Goal status: MET   LONG TERM GOALS: Target date: 07/22/2021   Pt will improve FOTO function score to no less than predicted as proxy for functional improvement Baseline: will assess next session 06/02/2021: 52% with 70% predicated Goal status: MET   2.  Pt will improve L elbow to full extension with no lag in order to improve functional ability with work and home ADLs Baseline: lacking 4 deg Goal status: MET   3.  Pt will be able and pull 50# sled with no increase in pain for improved functional ability with work duties Baseline: unable Goal status: MET   4.  Pt will be able to reach overhead not limited by L elbow pain  Baseline: unable Goal status: MET   PLAN: PT FREQUENCY: 1x/week   PT DURATION: 8 weeks   PLANNED INTERVENTIONS: Therapeutic exercises, Therapeutic activity, Neuromuscular re-education, Balance training, Gait training, Patient/Family education, Joint mobilization, Dry Needling, Electrical stimulation, Taping, Vasopneumatic device, and Manual therapy   PLAN FOR NEXT SESSION: DC     Evelene Croon, PTA 07/20/2021, 5:40 PM

## 2021-07-21 ENCOUNTER — Ambulatory Visit (INDEPENDENT_AMBULATORY_CARE_PROVIDER_SITE_OTHER): Payer: 59 | Admitting: Family Medicine

## 2021-07-21 VITALS — BP 140/98 | HR 88 | Ht 69.5 in | Wt 255.0 lb

## 2021-07-21 DIAGNOSIS — M778 Other enthesopathies, not elsewhere classified: Secondary | ICD-10-CM | POA: Diagnosis not present

## 2021-07-21 NOTE — Patient Instructions (Signed)
Thank you for coming in today.   Recheck as needed.  We could add shockwave if needed.  That would cost $60 per session and anticipate about 1 session per week for about 4 weeks.

## 2021-07-21 NOTE — Progress Notes (Signed)
   I, Jacob Williamson, LAT, ATC acting as a scribe for Clementeen Graham, MD.  Jacob Williamson is a 51 y.o. male who presents to [facility] today for f/u L elbow/triceps pain due to an acute flare of chronic calcific tendinitis. Pt was last seen by Dr. Denyse Amass on 06/09/21 and was advised to cont HEP and PT, completing 5 visits. Today, pt reports L elbow is feeling a lot better and has been d/c from PT. Pt still has issues doing push-up and putting pressure on L elbow.  Dx imaging: 04/28/21 L elbow XR  Pertinent review of systems: No fevers or chills  Relevant historical information: GERD   Exam:  BP (!) 140/98   Pulse 88   Ht 5' 9.5" (1.765 m)   Wt 255 lb (115.7 kg)   SpO2 98%   BMI 37.12 kg/m  General: Well Developed, well nourished, and in no acute distress.   MSK: Left elbow tender palpation at distal triceps tendon and at the proximal olecranon.  Intact strength.  Normal elbow motion.     Assessment and Plan: 51 y.o. male with left distal triceps tendinitis.  Did quite well with physical therapy.  Patient has transition to independent home exercise program and is ready to return to work.  We will proceed with trial of return to work without restrictions.  If not doing well would consider shockwave therapy.  Recheck back with me as needed.  Discussed warning signs or symptoms. Please see discharge instructions. Patient expresses understanding.   The above documentation has been reviewed and is accurate and complete Clementeen Graham, M.D.

## 2021-07-23 IMAGING — CT CT ABD-PELV W/ CM
1 series · 13 of 36 positions shown, 17 images · IV contrast (APPLIED)
Comparison: No priors.

CLINICAL DATA: 48-year-old male with suspected diverticulitis.
Epigastric pain for the past 2 days.

EXAM:
CT ABDOMEN AND PELVIS WITH CONTRAST
TECHNIQUE: Multidetector CT imaging of the abdomen and pelvis was performed
using the standard protocol following bolus administration of
intravenous contrast.
CONTRAST:  100mL OMNIPAQUE IOHEXOL 300 MG/ML  SOLN

[Series 6: abdomen 3.0 mpr cor · coronal · 0.84mm/px · 13 of 107 slices shown, 17 images]
[im 4/107  lung]
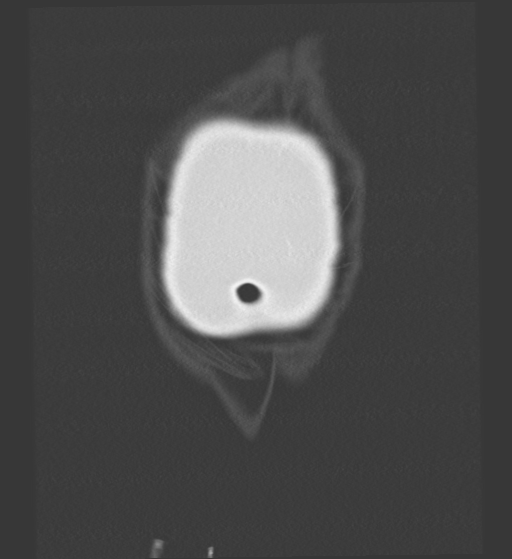
[im 7/107  soft-tissue]
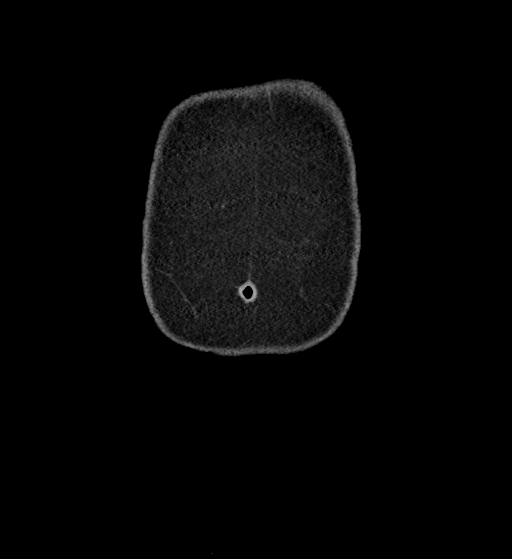
[im 7/107  lung]
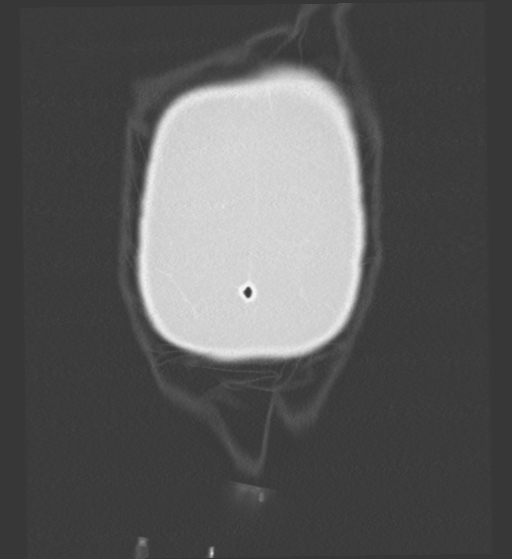
[im 7/107  bone]
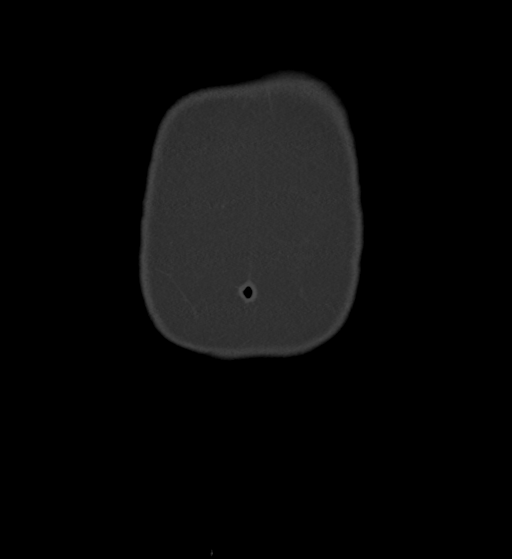
[im 11/107  lung]
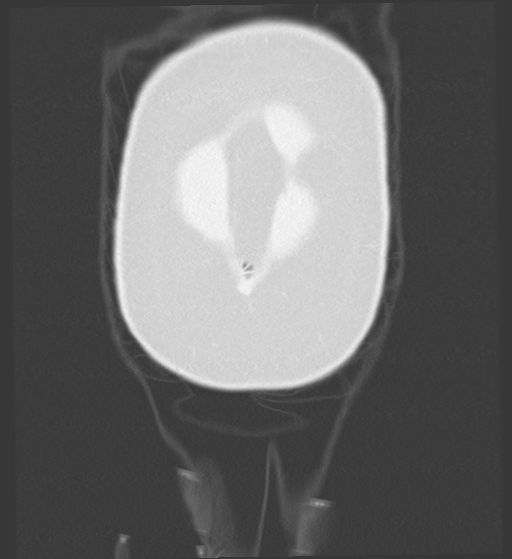
[im 14/107  soft-tissue]
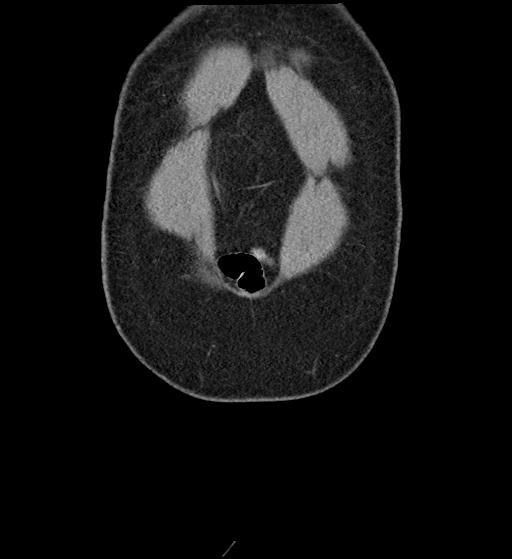
[im 14/107  lung]
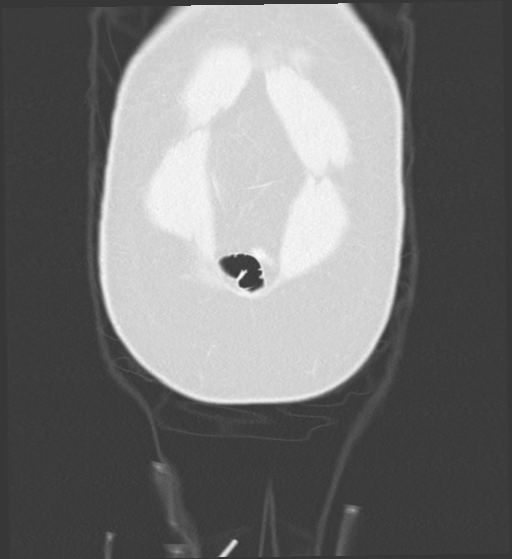
[im 24/107  soft-tissue]
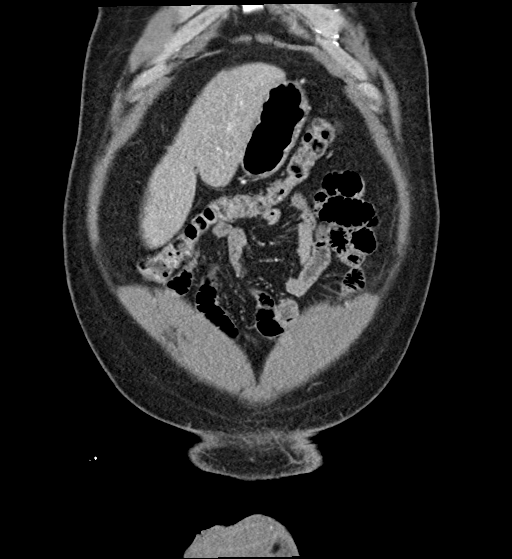
[im 35/107  soft-tissue]
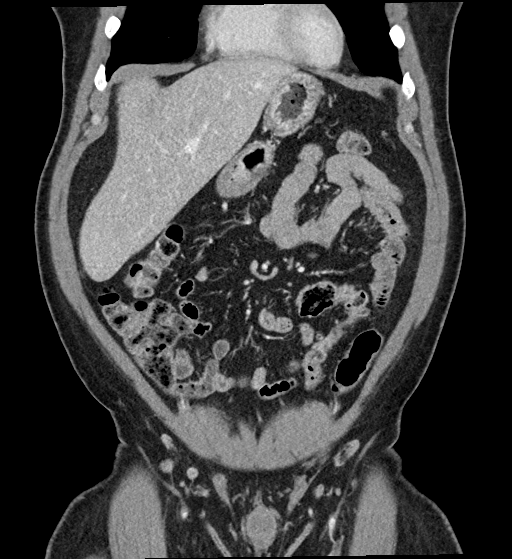
[im 42/107  soft-tissue]
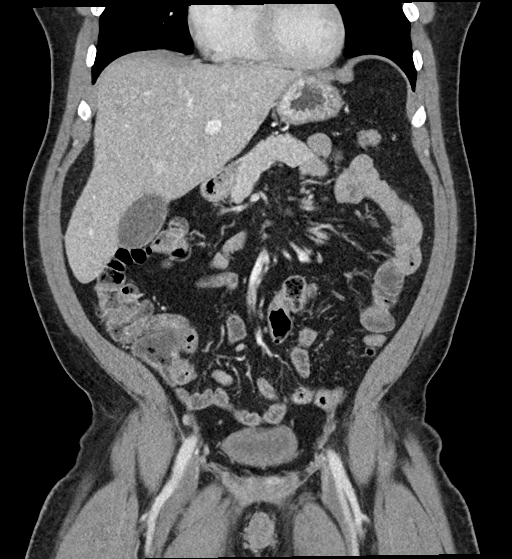
[im 55/107  soft-tissue]
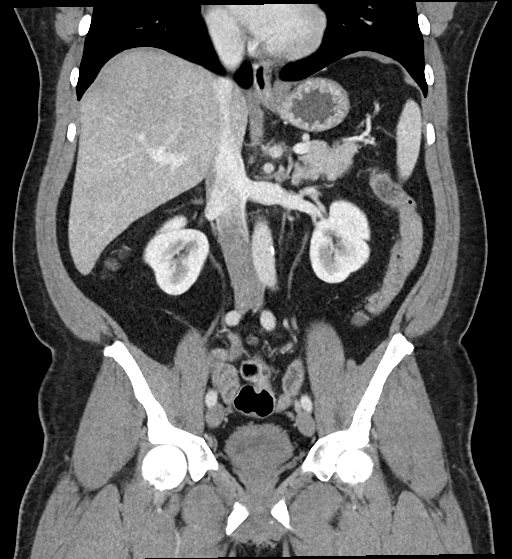
[im 65/107  soft-tissue]
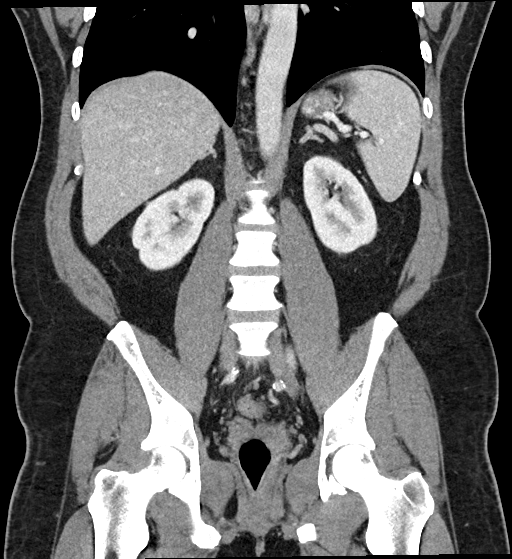
[im 72/107  soft-tissue]
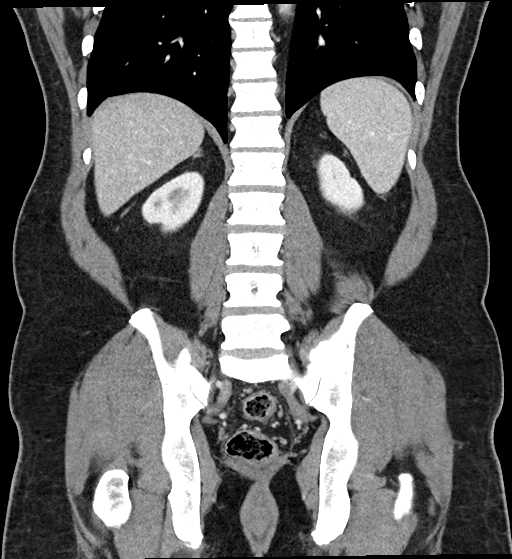
[im 83/107  soft-tissue]
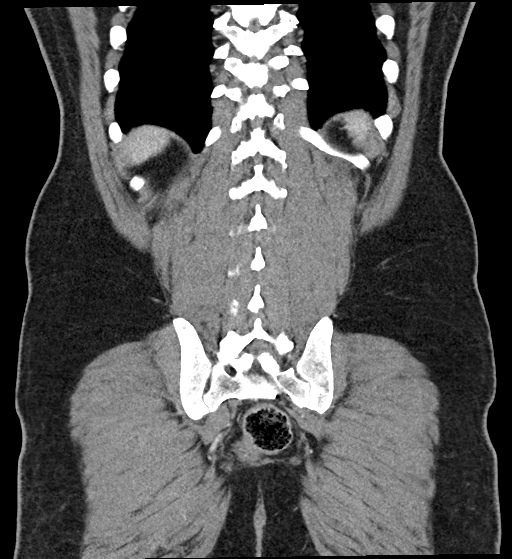
[im 83/107  bone]
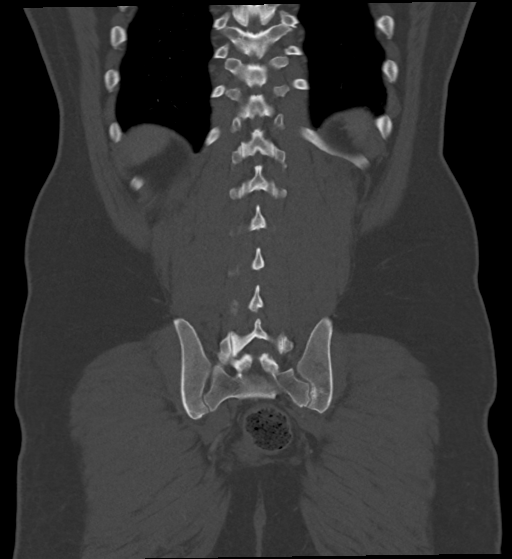
[im 93/107  soft-tissue]
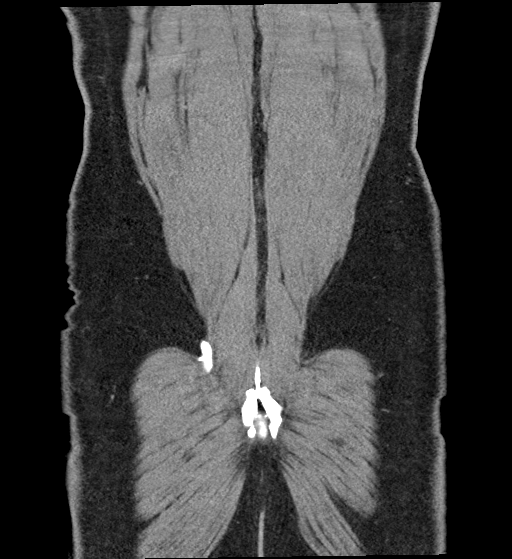
[im 100/107  soft-tissue]
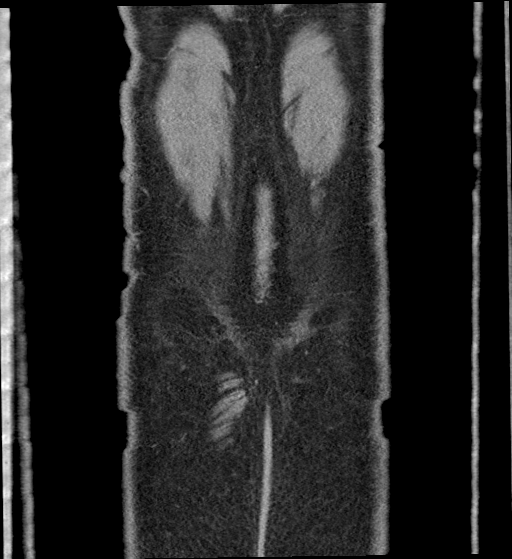

[13 of 36 positions shown; findings below may reference images not displayed]

FINDINGS: Lower chest: Unremarkable.

Hepatobiliary: No suspicious cystic or solid hepatic lesions. No
intra or extrahepatic biliary ductal dilatation. Gallbladder is
normal in appearance.

Pancreas: In the uncinate process of the pancreas there is a 7 mm
lesion which is too small to characterize (axial image 34 of series
3), but appears potentially fatty (although there is some central
soft tissue). No other larger more suspicious appearing pancreatic
mass. No pancreatic ductal dilatation. No pancreatic or
peripancreatic fluid collections or inflammatory changes.

Spleen: Unremarkable.

Adrenals/Urinary Tract: Bilateral kidneys and adrenal glands are
normal in appearance. No hydroureteronephrosis. Urinary bladder is
normal in appearance.

Stomach/Bowel: The appearance of the stomach is normal. No
pathologic dilatation of small bowel or colon. Numerous colonic
diverticulae are noted, particularly in the descending colon and
proximal sigmoid colon, without surrounding inflammatory changes to
suggest an acute diverticulitis at this time. Normal appendix.

Vascular/Lymphatic: Aortic atherosclerosis, without evidence of
aneurysm or dissection in the abdominal or pelvic vasculature. No
lymphadenopathy noted in the abdomen or pelvis.

Reproductive: Prostate gland and seminal vesicles are unremarkable
in appearance.

Other: No significant volume of ascites.  No pneumoperitoneum.

Musculoskeletal: There are no aggressive appearing lytic or blastic
lesions noted in the visualized portions of the skeleton.
IMPRESSION: 1. No acute findings are noted in the abdomen or pelvis to account
for the patient's symptoms.
2. Specifically, although there is mild colonic diverticulosis,
there is no evidence to suggest an acute diverticulitis at this
time.
3. 7 mm indeterminate lesion in the uncinate process of the
pancreas, too small to characterize. Although this is favored to
represent some focal fatty invagination into the pancreas (which
would be a benign finding), further characterization with
nonemergent abdominal MRI with and without IV gadolinium is
recommended in the near future to better evaluate this lesion and
establish a baseline for future follow-up.
4. Aortic atherosclerosis.

## 2021-10-07 ENCOUNTER — Ambulatory Visit: Payer: 59 | Admitting: Family Medicine

## 2021-10-07 ENCOUNTER — Encounter: Payer: Self-pay | Admitting: Family Medicine

## 2021-10-07 VITALS — BP 132/86 | HR 87 | Temp 98.7°F | Wt 263.8 lb

## 2021-10-07 DIAGNOSIS — S46911A Strain of unspecified muscle, fascia and tendon at shoulder and upper arm level, right arm, initial encounter: Secondary | ICD-10-CM | POA: Diagnosis not present

## 2021-10-07 DIAGNOSIS — M25511 Pain in right shoulder: Secondary | ICD-10-CM

## 2021-10-07 NOTE — Patient Instructions (Signed)
You can use Aspercreme or other topical creams for your shoulder.  You can also use Ibuprofen, heat, massage, stretching exercises for your shoulder.  Try sleeping in a different position at night to decrease stress on the joint.

## 2021-10-07 NOTE — Progress Notes (Signed)
Subjective:    Patient ID: Jacob Williamson, male    DOB: 1970/12/16, 51 y.o.   MRN: [id number]  Chief Complaint  Patient presents with   Pain    Right shoulder for 1 wk.feels like an irritating pain, like it may have been jammed or he could have slept on it wrong. Has to stretch/pull it out before he can move it.     HPI Patient was seen today for acute concern.  Pt endorses R shoulder pain x 1-2 wks.  Denies injury, hearing any pops, clicks, or feeling any tears.  States woke up one morning with the discomfort.  At times has pain in lateral R shoulder that goes up towards neck.  Feels like when u jam your finger.  Pt is a side sleeper.  Tried icy hot once for symptoms.  Inquires if overcompensated using R shoulder/arm after L shoulder/arm injury in March.  Past Medical History:  Diagnosis Date   Asthma    past hx - not current    Diverticulosis    sigmoid colon and descending colon   GERD (gastroesophageal reflux disease)    Hyperlipidemia    no medications    Obesity (BMI 35.0-39.9 without comorbidity)     Allergies  Allergen Reactions   Shellfish Allergy Anaphylaxis, Itching and Swelling   Catfish [Fish Allergy] Itching and Other (See Comments)    Mainly focused on throat and inside mouth, itching    Other     [place] nuts    ROS General: Denies fever, chills, night sweats, changes in weight, changes in appetite HEENT: Denies headaches, ear pain, changes in vision, rhinorrhea, sore throat CV: Denies CP, palpitations, SOB, orthopnea Pulm: Denies SOB, cough, wheezing GI: Denies abdominal pain, nausea, vomiting, diarrhea, constipation GU: Denies dysuria, hematuria, frequency Msk: Denies muscle cramps, joint pains  +R shoulder pain Neuro: Denies weakness, numbness, tingling Skin: Denies rashes, bruising Psych: Denies depression, anxiety, hallucinations     Objective:    Blood pressure 132/86, pulse 87, temperature 98.7 F (37.1 C), temperature source Oral, weight  263 lb 12.8 oz (119.7 kg), SpO2 94 %.  Gen. Pleasant, well-nourished, in no distress, normal affect   HEENT: [place]/AT, face symmetric, conjunctiva clear, no scleral icterus, PERRLA, EOMI, nares patent without drainage Lungs: no accessory muscle use Cardiovascular: RRR, no peripheral edema Musculoskeletal: FROM active and passive of RUE.  No crepitus or catching.  R cross arm causes discomfort of AC joint.  Negative empty can.  No deformities, no cyanosis or clubbing, normal tone Neuro:  A&Ox3, CN II-XII intact   Wt Readings from Last 3 Encounters:  10/07/21 263 lb 12.8 oz (119.7 kg)  07/21/21 255 lb (115.7 kg)  06/09/21 260 lb 3.2 oz (118 kg)    Lab Results  Component Value Date   WBC 5.4 07/01/2020   HGB 15.2 07/01/2020   HCT 48.2 07/01/2020   PLT 209 07/01/2020   GLUCOSE 95 07/01/2020   CHOL 189 05/06/2019   TRIG 145.0 05/06/2019   HDL 40.00 05/06/2019   LDLCALC 120 (H) 05/06/2019   ALT 23 07/01/2020   AST 28 07/01/2020   NA 137 07/01/2020   K 3.9 07/01/2020   CL 102 07/01/2020   CREATININE 1.41 (H) 07/01/2020   BUN 12 07/01/2020   CO2 25 07/01/2020   TSH 1.22 05/06/2019   PSA 0.69 05/06/2019   HGBA1C 5.8 (A) 08/22/2019    Assessment/Plan:  Acute pain of right shoulder  Strain of acromioclavicular joint, right, initial encounter  Supportive care heat, topical analgesics, massage, stretching, ice, Tylenol or NSAIDs prn, etc.  Symptoms likely 2/2 strain from sleeping on side.  Advised on likely duration of symptoms.   F/u with pcp in 2-3 wks for continued symptoms  Abbe Amsterdam, MD

## 2021-11-03 ENCOUNTER — Encounter: Payer: Self-pay | Admitting: Internal Medicine

## 2021-11-03 ENCOUNTER — Ambulatory Visit (INDEPENDENT_AMBULATORY_CARE_PROVIDER_SITE_OTHER): Payer: Self-pay | Admitting: Internal Medicine

## 2021-11-03 VITALS — BP 140/98 | HR 79 | Temp 98.3°F | Ht 69.5 in | Wt 264.9 lb

## 2021-11-03 DIAGNOSIS — M25511 Pain in right shoulder: Secondary | ICD-10-CM

## 2021-11-03 DIAGNOSIS — G8929 Other chronic pain: Secondary | ICD-10-CM

## 2021-11-03 NOTE — Progress Notes (Signed)
Established Patient Office Visit     CC/Reason for Visit: Right shoulder pain  HPI: Jacob Williamson is a 51 y.o. male who is coming in today for the above mentioned reasons.  He has been experiencing right shoulder pain for about 2 months.  He is recovering from left distal triceps tendinitis.  He has been sleeping more on his right side.  He noticed about 2 months ago he started developing pain right around his right shoulder.  He is concerned about a rotator cuff tear.   Past Medical/Surgical History: Past Medical History:  Diagnosis Date   Asthma    past hx - not current    Diverticulosis    sigmoid colon and descending colon   GERD (gastroesophageal reflux disease)    Hyperlipidemia    no medications    Obesity (BMI 35.0-39.9 without comorbidity)     Past Surgical History:  Procedure Laterality Date   COLONOSCOPY  05/20/2019    Social History:  reports that he has quit smoking. He has never used smokeless tobacco. He reports current alcohol use. He reports that he does not use drugs.  Allergies: Allergies  Allergen Reactions   Shellfish Allergy Anaphylaxis, Itching and Swelling   Catfish [Fish Allergy] Itching and Other (See Comments)    Mainly focused on throat and inside mouth, itching    Other     [place] nuts    Family History:  Family History  Problem Relation Age of Onset   Hypertension Mother    Diabetes Mother    Hypertension Father    Asthma Father    Diabetes Maternal Grandmother    Colon cancer Neg Hx    Colon polyps Neg Hx    Esophageal cancer Neg Hx    Rectal cancer Neg Hx    Stomach cancer Neg Hx      Current Outpatient Medications:    BLACK CURRANT SEED OIL PO, Take by mouth daily., Disp: , Rfl:    cyanocobalamin (,VITAMIN B-12,) 1000 MCG/ML injection, Inject 7ml in deltoid once weekly for 4 weeks, then inject 1 ml once a month thereafter, Disp: 6 mL, Rfl: 1   Multiple Vitamin (MULTIVITAMIN) tablet, Take 1 tablet by mouth  daily., Disp: , Rfl:    naproxen (NAPROSYN) 500 MG tablet, Take 1 tablet (500 mg total) by mouth 2 (two) times daily with a meal. (Patient not taking: Reported on 10/07/2021), Disp: 30 tablet, Rfl: 0   nitroGLYCERIN (NITRODUR - DOSED IN MG/24 HR) 0.2 mg/hr patch, Place 1 patch (0.2 mg total) onto the skin daily. Place 1/4 of the patch over the affected area. (Patient not taking: Reported on 10/07/2021), Disp: 30 patch, Rfl: 1   nystatin (MYCOSTATIN/NYSTOP) powder, Apply 1 application topically 3 (three) times daily. (Patient not taking: Reported on 10/07/2021), Disp: 15 g, Rfl: 1   SYRINGE-NEEDLE, DISP, 3 ML (BD SAFETYGLIDE SYRINGE/NEEDLE) 25G X 1" 3 ML MISC, Use for B12 injections (Patient not taking: Reported on 10/07/2021), Disp: 100 each, Rfl: 11   Vitamin D, Ergocalciferol, (DRISDOL) 1.25 MG (50000 UNIT) CAPS capsule, Take 50,000 Units by mouth once a week. (Patient not taking: Reported on 10/07/2021), Disp: , Rfl:   Review of Systems:  Constitutional: Denies fever, chills, diaphoresis, appetite change and fatigue.  HEENT: Denies photophobia, eye pain, redness, hearing loss, ear pain, congestion, sore throat, rhinorrhea, sneezing, mouth sores, trouble swallowing, neck pain, neck stiffness and tinnitus.   Respiratory: Denies SOB, DOE, cough, chest tightness,  and wheezing.  Cardiovascular: Denies chest pain, palpitations and leg swelling.  Gastrointestinal: Denies nausea, vomiting, abdominal pain, diarrhea, constipation, blood in stool and abdominal distention.  Genitourinary: Denies dysuria, urgency, frequency, hematuria, flank pain and difficulty urinating.  Endocrine: Denies: hot or cold intolerance, sweats, changes in hair or nails, polyuria, polydipsia. Musculoskeletal: Denies myalgias, back pain, joint swelling,  and gait problem.  Skin: Denies pallor, rash and wound.  Neurological: Denies dizziness, seizures, syncope, weakness, light-headedness, numbness and headaches.  Hematological: Denies  adenopathy. Easy bruising, personal or family bleeding history  Psychiatric/Behavioral: Denies suicidal ideation, mood changes, confusion, nervousness, sleep disturbance and agitation    Physical Exam: Vitals:   11/03/21 1436 11/03/21 1446  BP: (!) 150/96 (!) 140/98  Pulse: 79   Temp: 98.3 F (36.8 C)   SpO2: 96%   Weight: 264 lb 14.4 oz (120.2 kg)   Height: 5' 9.5" (1.765 m)     Body mass index is 38.56 kg/m.   Constitutional: NAD, calm, comfortable Eyes: PERRL, lids and conjunctivae normal ENMT: Mucous membranes are moist.   Musculoskeletal: No edema or erythema of the right shoulder joint, he has full range of motion in both frontal and lateral raise.  It starts getting painful above 90 degrees of raise but he is able to complete the movement. Psychiatric: Normal judgment and insight. Alert and oriented x 3. Normal mood.    Impression and Plan:  Chronic right shoulder pain - Plan: Ambulatory referral to Orthopedic Surgery  -Suspect arthritis versus may be shoulder impingement.  Doubt rotator cuff tear given he has full range of motion. -Refer to orthopedics for further evaluation and management.  Time spent:22 minutes reviewing chart, interviewing and examining patient and formulating plan of care.      Lelon Frohlich, MD [facility]

## 2021-11-21 IMAGING — DX DG THORACIC SPINE 2V
3 series · 3 of 3 positions shown · non-contrast
Comparison: None.

CLINICAL DATA: Back pain.

EXAM:
THORACIC SPINE 2 VIEWS

[t-spine ap]
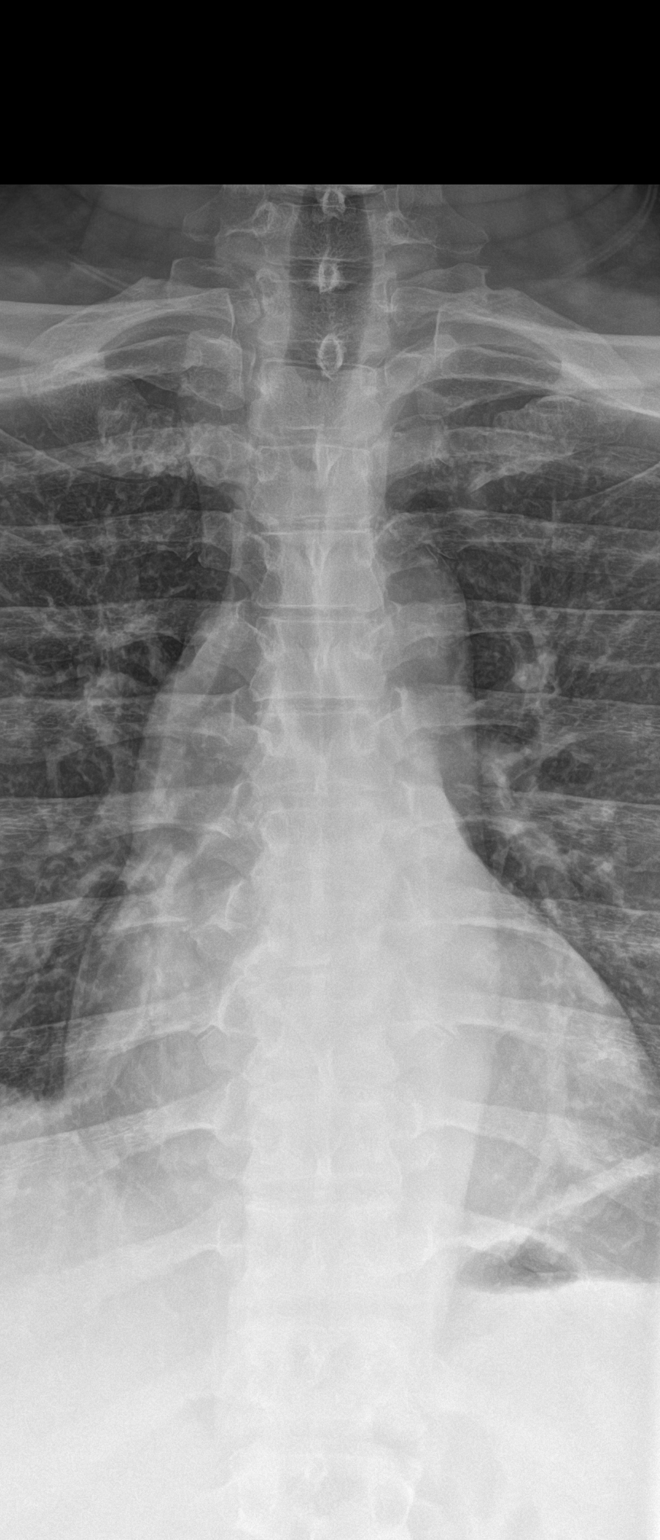

[t-spine lat]
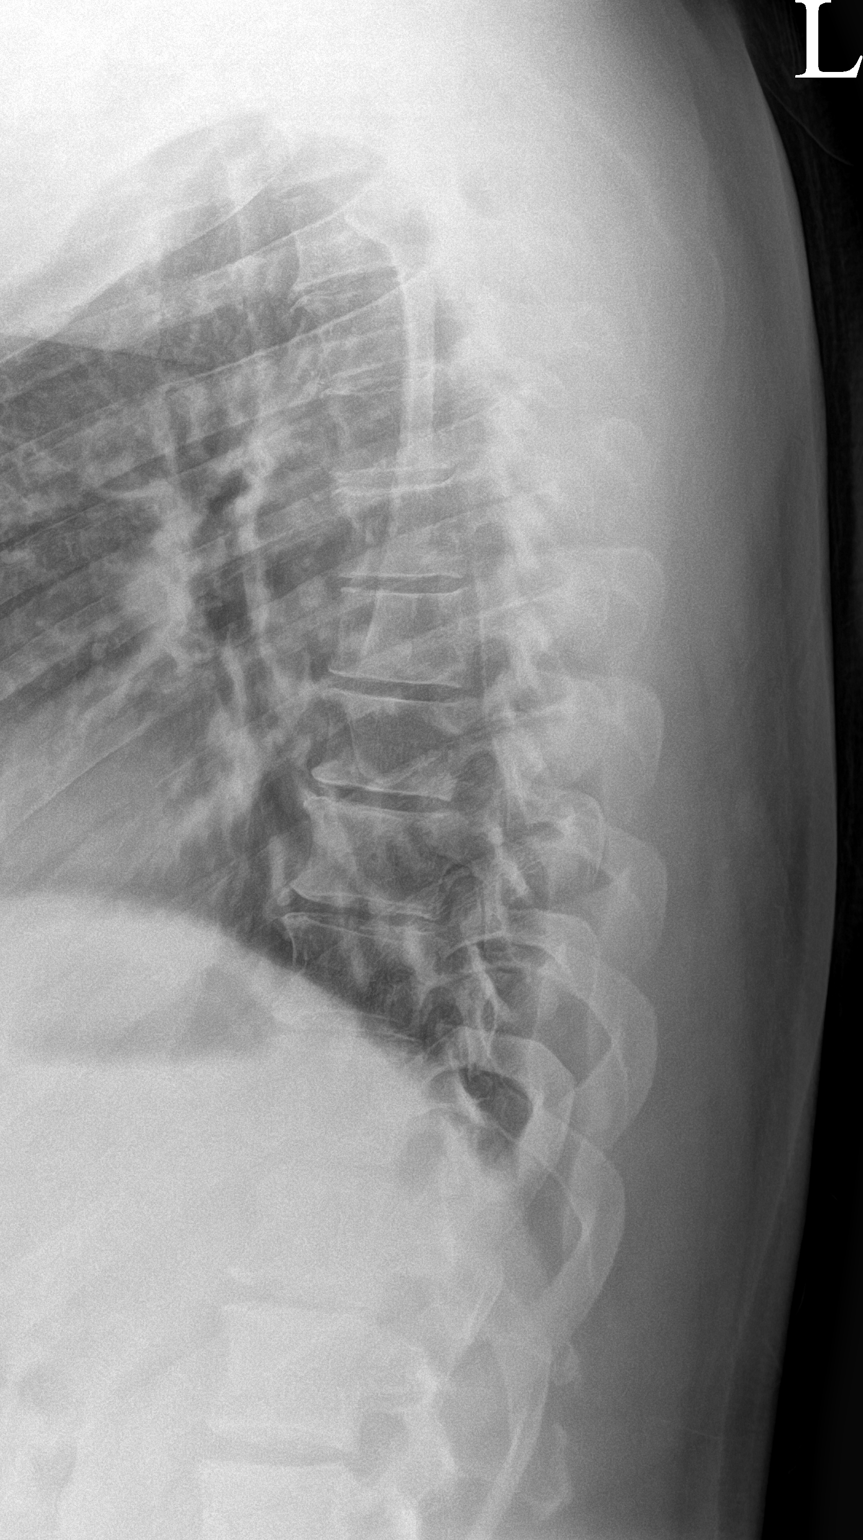

[ct-spine swimmers]
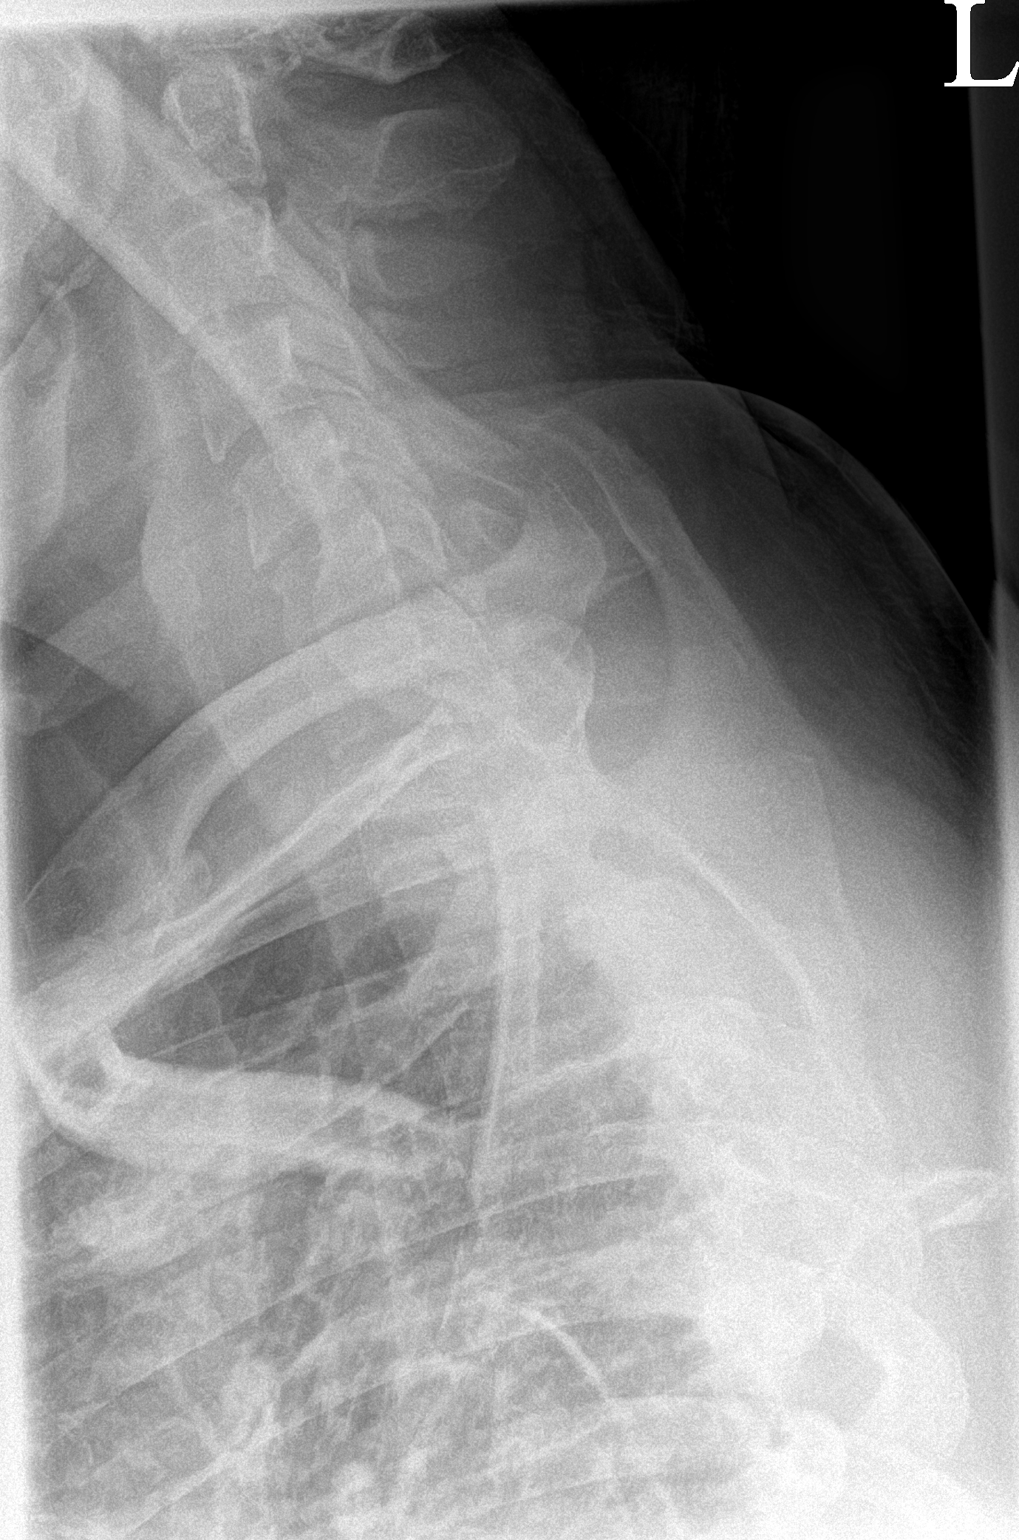

[3 of 3 positions shown; findings below may reference images not displayed]

FINDINGS: There is no evidence of thoracic spine fracture. Alignment is
normal. No other significant bone abnormalities are identified.
IMPRESSION: Negative.

## 2021-11-22 ENCOUNTER — Ambulatory Visit (INDEPENDENT_AMBULATORY_CARE_PROVIDER_SITE_OTHER): Payer: BC Managed Care – PPO | Admitting: Internal Medicine

## 2021-11-22 VITALS — BP 150/100 | HR 87 | Temp 98.4°F | Ht 69.0 in | Wt 261.8 lb

## 2021-11-22 DIAGNOSIS — E669 Obesity, unspecified: Secondary | ICD-10-CM

## 2021-11-22 DIAGNOSIS — R7302 Impaired glucose tolerance (oral): Secondary | ICD-10-CM

## 2021-11-22 DIAGNOSIS — I10 Essential (primary) hypertension: Secondary | ICD-10-CM | POA: Diagnosis not present

## 2021-11-22 DIAGNOSIS — E538 Deficiency of other specified B group vitamins: Secondary | ICD-10-CM

## 2021-11-22 DIAGNOSIS — Z0000 Encounter for general adult medical examination without abnormal findings: Secondary | ICD-10-CM | POA: Diagnosis not present

## 2021-11-22 DIAGNOSIS — E559 Vitamin D deficiency, unspecified: Secondary | ICD-10-CM | POA: Diagnosis not present

## 2021-11-22 DIAGNOSIS — E785 Hyperlipidemia, unspecified: Secondary | ICD-10-CM

## 2021-11-22 DIAGNOSIS — M25511 Pain in right shoulder: Secondary | ICD-10-CM | POA: Diagnosis not present

## 2021-11-22 DIAGNOSIS — M542 Cervicalgia: Secondary | ICD-10-CM | POA: Diagnosis not present

## 2021-11-22 MED ORDER — AMLODIPINE BESYLATE 5 MG PO TABS: 5.0000 mg | ORAL_TABLET | Freq: Every day | ORAL | 1 refills | Status: AC

## 2021-11-22 NOTE — Progress Notes (Signed)
Established Patient Office Visit     CC/Reason for Visit: Annual preventive exam  HPI: Jacob Williamson is a 51 y.o. male who is coming in today for the above mentioned reasons. Past Medical History is significant for: Obesity, vitamin D deficiency, hyperlipidemia, impaired glucose tolerance.  He is feeling well today.  He just saw the orthopedist who believes he has right shoulder bursitis and gave him a bursa steroid injection today.  He is again noted to have elevated blood pressure.  He has routine dental care but is overdue for an eye exam.  He is due for flu and COVID vaccines.  He had a colonoscopy in 2021.   Past Medical/Surgical History: Past Medical History:  Diagnosis Date   Asthma    past hx - not current    Diverticulosis    sigmoid colon and descending colon   GERD (gastroesophageal reflux disease)    Hyperlipidemia    no medications    Obesity (BMI 35.0-39.9 without comorbidity)     Past Surgical History:  Procedure Laterality Date   COLONOSCOPY  05/20/2019    Social History:  reports that he has quit smoking. He has never used smokeless tobacco. He reports current alcohol use. He reports that he does not use drugs.  Allergies: Allergies  Allergen Reactions   Shellfish Allergy Anaphylaxis, Itching and Swelling   Catfish [Fish Allergy] Itching and Other (See Comments)    Mainly focused on throat and inside mouth, itching    Other     [place] nuts    Family History:  Family History  Problem Relation Age of Onset   Hypertension Mother    Diabetes Mother    Hypertension Father    Asthma Father    Diabetes Maternal Grandmother    Colon cancer Neg Hx    Colon polyps Neg Hx    Esophageal cancer Neg Hx    Rectal cancer Neg Hx    Stomach cancer Neg Hx      Current Outpatient Medications:    amLODipine (NORVASC) 5 MG tablet, Take 1 tablet (5 mg total) by mouth daily., Disp: 90 tablet, Rfl: 1   BLACK CURRANT SEED OIL PO, Take by mouth  daily., Disp: , Rfl:    Multiple Vitamin (MULTIVITAMIN) tablet, Take 1 tablet by mouth daily., Disp: , Rfl:    cyanocobalamin (,VITAMIN B-12,) 1000 MCG/ML injection, Inject 20ml in deltoid once weekly for 4 weeks, then inject 1 ml once a month thereafter (Patient not taking: Reported on 11/22/2021), Disp: 6 mL, Rfl: 1   naproxen (NAPROSYN) 500 MG tablet, Take 1 tablet (500 mg total) by mouth 2 (two) times daily with a meal. (Patient not taking: Reported on 10/07/2021), Disp: 30 tablet, Rfl: 0   nitroGLYCERIN (NITRODUR - DOSED IN MG/24 HR) 0.2 mg/hr patch, Place 1 patch (0.2 mg total) onto the skin daily. Place 1/4 of the patch over the affected area. (Patient not taking: Reported on 10/07/2021), Disp: 30 patch, Rfl: 1   nystatin (MYCOSTATIN/NYSTOP) powder, Apply 1 application topically 3 (three) times daily. (Patient not taking: Reported on 10/07/2021), Disp: 15 g, Rfl: 1   SYRINGE-NEEDLE, DISP, 3 ML (BD SAFETYGLIDE SYRINGE/NEEDLE) 25G X 1" 3 ML MISC, Use for B12 injections (Patient not taking: Reported on 10/07/2021), Disp: 100 each, Rfl: 11   Vitamin D, Ergocalciferol, (DRISDOL) 1.25 MG (50000 UNIT) CAPS capsule, Take 50,000 Units by mouth once a week. (Patient not taking: Reported on 10/07/2021), Disp: , Rfl:   Review of  Systems:  Constitutional: Denies fever, chills, diaphoresis, appetite change and fatigue.  HEENT: Denies photophobia, eye pain, redness, hearing loss, ear pain, congestion, sore throat, rhinorrhea, sneezing, mouth sores, trouble swallowing, neck pain, neck stiffness and tinnitus.   Respiratory: Denies SOB, DOE, cough, chest tightness,  and wheezing.   Cardiovascular: Denies chest pain, palpitations and leg swelling.  Gastrointestinal: Denies nausea, vomiting, abdominal pain, diarrhea, constipation, blood in stool and abdominal distention.  Genitourinary: Denies dysuria, urgency, frequency, hematuria, flank pain and difficulty urinating.  Endocrine: Denies: hot or cold intolerance,  sweats, changes in hair or nails, polyuria, polydipsia. Musculoskeletal: Denies myalgias, back pain, joint swelling, arthralgias and gait problem.  Skin: Denies pallor, rash and wound.  Neurological: Denies dizziness, seizures, syncope, weakness, light-headedness, numbness and headaches.  Hematological: Denies adenopathy. Easy bruising, personal or family bleeding history  Psychiatric/Behavioral: Denies suicidal ideation, mood changes, confusion, nervousness, sleep disturbance and agitation    Physical Exam: Vitals:   11/22/21 1437  BP: (!) 150/100  Pulse: 87  Temp: 98.4 F (36.9 C)  TempSrc: Oral  SpO2: 97%  Weight: 261 lb 12.8 oz (118.8 kg)  Height: 5\' 9"  (1.753 m)    Body mass index is 38.66 kg/m.   Constitutional: NAD, calm, comfortable Eyes: PERRL, lids and conjunctivae normal ENMT: Mucous membranes are moist. Posterior pharynx clear of any exudate or lesions. Normal dentition. Tympanic membrane is pearly white, no erythema or bulging. Neck: normal, supple, no masses, no thyromegaly Respiratory: clear to auscultation bilaterally, no wheezing, no crackles. Normal respiratory effort. No accessory muscle use.  Cardiovascular: Regular rate and rhythm, no murmurs / rubs / gallops. No extremity edema. 2+ pedal pulses. No carotid bruits.  Abdomen: no tenderness, no masses palpated. No hepatosplenomegaly. Bowel sounds positive.  Musculoskeletal: no clubbing / cyanosis. No joint deformity upper and lower extremities. Good ROM, no contractures. Normal muscle tone.  Skin: no rashes, lesions, ulcers. No induration Neurologic: CN 2-12 grossly intact. Sensation intact, DTR normal. Strength 5/5 in all 4.  Psychiatric: Normal judgment and insight. Alert and oriented x 3. Normal mood.   Flowsheet Row Office Visit from 11/22/2021 in [facility]  PHQ-9 Total Score 4         Impression and Plan:  Encounter for preventive health examination - Plan: PSA  Primary  hypertension - Plan: amLODipine (NORVASC) 5 MG tablet, CBC with Differential/Platelet, Comprehensive metabolic panel  Obesity (BMI 35.0-39.9 without comorbidity) - Plan: TSH  Vitamin B12 deficiency - Plan: Vitamin B12  Vitamin D deficiency - Plan: VITAMIN D 25 Hydroxy (Vit-D Deficiency, Fractures)  Hyperlipidemia, unspecified hyperlipidemia type - Plan: Lipid panel  IGT (impaired glucose tolerance) - Plan: Hemoglobin A1c    -Recommend routine eye and dental care. -Immunizations: Due for flu and COVID vaccines but declines both today despite counseling -Healthy lifestyle discussed in detail. -Labs to be updated today. -Colon cancer screening: 05/2019, 10-year callback -Breast cancer screening: Not applicable -Cervical cancer screening: Not applicable -Lung cancer screening: Not applicable -Prostate cancer screening: PSA today -DEXA: Not applicable  -He is again noted to have elevated blood pressure, review of notes at other offices also demonstrate elevated blood pressure.  I will go ahead and make a diagnosis of hypertension today.  He has been started on amlodipine 5 mg daily.  He will come back in 6 to 8 weeks to follow-up.    06/2019, MD [facility] at [facility]

## 2021-11-23 ENCOUNTER — Other Ambulatory Visit: Payer: Self-pay | Admitting: Internal Medicine

## 2021-11-23 ENCOUNTER — Other Ambulatory Visit (INDEPENDENT_AMBULATORY_CARE_PROVIDER_SITE_OTHER): Payer: BC Managed Care – PPO

## 2021-11-23 DIAGNOSIS — R7302 Impaired glucose tolerance (oral): Secondary | ICD-10-CM

## 2021-11-23 DIAGNOSIS — E559 Vitamin D deficiency, unspecified: Secondary | ICD-10-CM | POA: Diagnosis not present

## 2021-11-23 DIAGNOSIS — E785 Hyperlipidemia, unspecified: Secondary | ICD-10-CM

## 2021-11-23 DIAGNOSIS — E538 Deficiency of other specified B group vitamins: Secondary | ICD-10-CM

## 2021-11-23 DIAGNOSIS — E669 Obesity, unspecified: Secondary | ICD-10-CM

## 2021-11-23 DIAGNOSIS — Z0000 Encounter for general adult medical examination without abnormal findings: Secondary | ICD-10-CM | POA: Diagnosis not present

## 2021-11-23 DIAGNOSIS — I10 Essential (primary) hypertension: Secondary | ICD-10-CM | POA: Diagnosis not present

## 2021-11-23 LAB — LIPID PANEL
Cholesterol: 205 mg/dL — ABNORMAL HIGH (ref 0–200)
HDL: 43.2 mg/dL (ref >39.00)
LDL Cholesterol: 141 mg/dL — ABNORMAL HIGH (ref 0–99)
NonHDL: 161.4
Total CHOL/HDL Ratio: 5
Triglycerides: 100 mg/dL (ref 0.0–149.0)
VLDL: 20 mg/dL (ref 0.0–40.0)

## 2021-11-23 LAB — CBC WITH DIFFERENTIAL/PLATELET
Basophils Absolute: 0 10*3/uL (ref 0.0–0.1)
Basophils Relative: 0.2 % (ref 0.0–3.0)
Eosinophils Absolute: 0 10*3/uL (ref 0.0–0.7)
Eosinophils Relative: 0.2 % (ref 0.0–5.0)
HCT: 45.5 % (ref 39.0–52.0)
Hemoglobin: 14.6 g/dL (ref 13.0–17.0)
Lymphocytes Relative: 32.3 % (ref 12.0–46.0)
Lymphs Abs: 3.5 10*3/uL (ref 0.7–4.0)
MCHC: 32 g/dL (ref 30.0–36.0)
MCV: 81.4 fl (ref 78.0–100.0)
Monocytes Absolute: 0.5 10*3/uL (ref 0.1–1.0)
Monocytes Relative: 4.7 % (ref 3.0–12.0)
Neutro Abs: 6.8 10*3/uL (ref 1.4–7.7)
Neutrophils Relative %: 62.6 % (ref 43.0–77.0)
Platelets: 251 10*3/uL (ref 150.0–400.0)
RBC: 5.59 Mil/uL (ref 4.22–5.81)
RDW: 14.8 % (ref 11.5–15.5)
WBC: 10.9 10*3/uL — ABNORMAL HIGH (ref 4.0–10.5)

## 2021-11-23 LAB — COMPREHENSIVE METABOLIC PANEL
ALT: 35 U/L (ref 0–53)
AST: 19 U/L (ref 0–37)
Albumin: 4.4 g/dL (ref 3.5–5.2)
Alkaline Phosphatase: 61 U/L (ref 39–117)
BUN: 17 mg/dL (ref 6–23)
CO2: 29 mEq/L (ref 19–32)
Calcium: 9.9 mg/dL (ref 8.4–10.5)
Chloride: 102 mEq/L (ref 96–112)
Creatinine, Ser: 1.26 mg/dL (ref 0.40–1.50)
GFR: 67.11 mL/min (ref >60.00)
Glucose, Bld: 105 mg/dL — ABNORMAL HIGH (ref 70–99)
Potassium: 4.7 mEq/L (ref 3.5–5.1)
Sodium: 138 mEq/L (ref 135–145)
Total Bilirubin: 0.4 mg/dL (ref 0.2–1.2)
Total Protein: 7.3 g/dL (ref 6.0–8.3)

## 2021-11-23 LAB — HEMOGLOBIN A1C: Hgb A1c MFr Bld: 6.4 % (ref 4.6–6.5)

## 2021-11-23 LAB — VITAMIN D 25 HYDROXY (VIT D DEFICIENCY, FRACTURES): VITD: 11.29 ng/mL — ABNORMAL LOW (ref 30.00–100.00)

## 2021-11-23 LAB — VITAMIN B12: Vitamin B-12: 176 pg/mL — ABNORMAL LOW (ref 211–911)

## 2021-11-23 LAB — TSH: TSH: 0.91 u[IU]/mL (ref 0.35–5.50)

## 2021-11-23 LAB — PSA: PSA: 0.58 ng/mL (ref 0.10–4.00)

## 2021-11-23 MED ORDER — VITAMIN D (ERGOCALCIFEROL) 1.25 MG (50000 UNIT) PO CAPS: 50000.0000 [IU] | ORAL_CAPSULE | ORAL | 0 refills | Status: AC

## 2021-11-24 ENCOUNTER — Other Ambulatory Visit: Payer: Self-pay | Admitting: *Deleted

## 2021-11-24 DIAGNOSIS — E785 Hyperlipidemia, unspecified: Secondary | ICD-10-CM

## 2021-11-24 DIAGNOSIS — R7303 Prediabetes: Secondary | ICD-10-CM

## 2021-11-24 MED ORDER — CYANOCOBALAMIN 1000 MCG/ML IJ SOLN: INTRAMUSCULAR | 1 refills | Status: DC

## 2021-11-24 MED ORDER — "BD SAFETYGLIDE SYRINGE/NEEDLE 25G X 1"" 3 ML MISC": 11 refills | Status: DC

## 2021-12-13 ENCOUNTER — Telehealth (INDEPENDENT_AMBULATORY_CARE_PROVIDER_SITE_OTHER): Payer: BC Managed Care – PPO | Admitting: Family Medicine

## 2021-12-13 ENCOUNTER — Encounter: Payer: Self-pay | Admitting: Family Medicine

## 2021-12-13 ENCOUNTER — Ambulatory Visit: Payer: BC Managed Care – PPO | Admitting: Family Medicine

## 2021-12-13 VITALS — Ht 69.0 in | Wt 261.8 lb

## 2021-12-13 DIAGNOSIS — J069 Acute upper respiratory infection, unspecified: Secondary | ICD-10-CM

## 2021-12-13 NOTE — Progress Notes (Signed)
Patient ID: Jacob Williamson, male   DOB: 11/09/1970, 51 y.o.   MRN: [id number]   Virtual Visit via Video Note  I connected with Jacob Williamson on 12/13/21 at  2:30 PM EDT by a video enabled telemedicine application and verified that I am speaking with the correct person using two identifiers.  Location patient: home Location provider:work or home office Persons participating in the virtual visit: patient, provider  I discussed the limitations of evaluation and management by telemedicine and the availability of in person appointments. The patient expressed understanding and agreed to proceed.   HPI: Jacob Williamson was seen by a virtual visit with onset this past Friday of sore throat followed by coughing and body aches.  Questionable low-grade fever over the weekend but none now.  Main symptoms been cough.  Some clear sputum.  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  Patient is currently on prednisone from orthopedic injury.  Denies any vomiting.  No diarrhea.   ROS: See pertinent positives and negatives per HPI.  Past Medical History:  Diagnosis Date   Asthma    past hx - not current    Diverticulosis    sigmoid colon and descending colon   GERD (gastroesophageal reflux disease)    Hyperlipidemia    no medications    Obesity (BMI 35.0-39.9 without comorbidity)     Past Surgical History:  Procedure Laterality Date   COLONOSCOPY  05/20/2019    Family History  Problem Relation Age of Onset   Hypertension Mother    Diabetes Mother    Hypertension Father    Asthma Father    Diabetes Maternal Grandmother    Colon cancer Neg Hx    Colon polyps Neg Hx    Esophageal cancer Neg Hx    Rectal cancer Neg Hx    Stomach cancer Neg Hx     SOCIAL HX: Former smoker   Current Outpatient Medications:    amLODipine (NORVASC) 5 MG tablet, Take 1 tablet (5 mg total) by mouth daily., Disp: 90 tablet, Rfl: 1   BLACK CURRANT SEED OIL PO, Take by mouth daily.,  Disp: , Rfl:    cyanocobalamin (VITAMIN B12) 1000 MCG/ML injection, Inject 48ml in deltoid once weekly for 4 weeks, then inject 1 ml once a month thereafter, Disp: 6 mL, Rfl: 1   Multiple Vitamin (MULTIVITAMIN) tablet, Take 1 tablet by mouth daily., Disp: , Rfl:    naproxen (NAPROSYN) 500 MG tablet, Take 1 tablet (500 mg total) by mouth 2 (two) times daily with a meal., Disp: 30 tablet, Rfl: 0   nitroGLYCERIN (NITRODUR - DOSED IN MG/24 HR) 0.2 mg/hr patch, Place 1 patch (0.2 mg total) onto the skin daily. Place 1/4 of the patch over the affected area., Disp: 30 patch, Rfl: 1   nystatin (MYCOSTATIN/NYSTOP) powder, Apply 1 application topically 3 (three) times daily., Disp: 15 g, Rfl: 1   SYRINGE-NEEDLE, DISP, 3 ML (BD SAFETYGLIDE SYRINGE/NEEDLE) 25G X 1" 3 ML MISC, Use for B12 injections, Disp: 100 each, Rfl: 11   Vitamin D, Ergocalciferol, (DRISDOL) 1.25 MG (50000 UNIT) CAPS capsule, Take 1 capsule (50,000 Units total) by mouth every 7 (seven) days for 12 doses., Disp: 12 capsule, Rfl: 0  EXAM:  VITALS per patient if applicable:  GENERAL: alert, oriented, appears well and in no acute distress  HEENT: atraumatic, conjunttiva clear, no obvious abnormalities on inspection of external nose and ears  NECK: normal movements of the head and neck  LUNGS: on  inspection no signs of respiratory distress, breathing rate appears normal, no obvious gross SOB, gasping or wheezing  CV: no obvious cyanosis  MS: moves all visible extremities without noticeable abnormality  PSYCH/NEURO: pleasant and cooperative, no obvious depression or anxiety, speech and thought processing grossly intact  ASSESSMENT AND PLAN:  Discussed the following assessment and plan:   Acute upper respiratory infection with cough.  Suspect probably viral by history.  No documented fever.  -Work note written for today and tomorrow. -We did discuss possible home COVID testing but he is getting outside a 5-day window for treatment  with antivirals -Consider over-the-counter Mucinex as needed for cough    I discussed the assessment and treatment plan with the patient. The patient was provided an opportunity to ask questions and all were answered. The patient agreed with the plan and demonstrated an understanding of the instructions.   The patient was advised to call back or seek an in-person evaluation if the symptoms worsen or if the condition fails to improve as anticipated.     Carolann Littler, MD

## 2021-12-17 ENCOUNTER — Ambulatory Visit (INDEPENDENT_AMBULATORY_CARE_PROVIDER_SITE_OTHER): Payer: BC Managed Care – PPO

## 2021-12-17 ENCOUNTER — Ambulatory Visit (INDEPENDENT_AMBULATORY_CARE_PROVIDER_SITE_OTHER): Payer: BC Managed Care – PPO | Admitting: Family Medicine

## 2021-12-17 ENCOUNTER — Encounter: Payer: Self-pay | Admitting: Family Medicine

## 2021-12-17 VITALS — BP 128/80 | HR 76 | Temp 98.1°F | Ht 69.0 in | Wt 256.1 lb

## 2021-12-17 DIAGNOSIS — J208 Acute bronchitis due to other specified organisms: Secondary | ICD-10-CM | POA: Diagnosis not present

## 2021-12-17 DIAGNOSIS — J189 Pneumonia, unspecified organism: Secondary | ICD-10-CM | POA: Diagnosis not present

## 2021-12-17 DIAGNOSIS — R0602 Shortness of breath: Secondary | ICD-10-CM | POA: Diagnosis not present

## 2021-12-17 DIAGNOSIS — R059 Cough, unspecified: Secondary | ICD-10-CM | POA: Diagnosis not present

## 2021-12-17 MED ORDER — PREDNISONE 20 MG PO TABS: 40.0000 mg | ORAL_TABLET | Freq: Every day | ORAL | 0 refills | Status: AC

## 2021-12-17 MED ORDER — AZITHROMYCIN 250 MG PO TABS: ORAL_TABLET | ORAL | 0 refills | Status: AC

## 2021-12-17 MED ORDER — CEFDINIR 300 MG PO CAPS: 300.0000 mg | ORAL_CAPSULE | Freq: Two times a day (BID) | ORAL | 0 refills | Status: AC

## 2021-12-17 NOTE — Progress Notes (Signed)
Established Patient Office Visit  Subjective   Patient ID: Jacob Williamson, male    DOB: 1970-12-25  Age: 51 y.o. MRN: [id number]  Chief Complaint  Patient presents with   Cough    Since last Thursday, productive.     Patient reports that he was seen last week virtually and was told he had a viral bronchitis. He reports that he had a history of asthma in the past which had improved since he stopped drinking and eating dairy products. States that he tried to return to work on Tuesday but he had a severe coughing fit. States that he is producing a lot of mucus. He denies any fever/chills, no headaches, no chest pain, no pain with inspiration, no sinus trouble, no pain or pressure,   Cough This is a new problem. The current episode started 1 to 4 weeks ago. The problem has been unchanged. The problem occurs constantly. Cough characteristics: productive of yellow mucus. Pertinent negatives include no chest pain, headaches, rhinorrhea or shortness of breath. The symptoms are aggravated by other. Risk factors for lung disease include occupational exposure. He has tried OTC cough suppressant for the symptoms. His past medical history is significant for asthma.   Current Outpatient Medications  Medication Instructions   amLODipine (NORVASC) 5 mg, Oral, Daily   azithromycin (ZITHROMAX) 250 MG tablet Take 2 tablets on day 1, then 1 tablet daily on days 2 through 5   BLACK CURRANT SEED OIL PO Oral, Daily   cefdinir (OMNICEF) 300 mg, Oral, 2 times daily   cyanocobalamin (VITAMIN B12) 1000 MCG/ML injection Inject 72ml in deltoid once weekly for 4 weeks, then inject 1 ml once a month thereafter   Multiple Vitamin (MULTIVITAMIN) tablet 1 tablet, Oral, Daily   naproxen (NAPROSYN) 500 mg, Oral, 2 times daily with meals   nitroGLYCERIN (NITRODUR - DOSED IN MG/24 HR) 0.2 mg, Transdermal, Daily, Place 1/4 of the patch over the affected area.   nystatin (MYCOSTATIN/NYSTOP) powder 1 application , Topical, 3  times daily   predniSONE (DELTASONE) 40 mg, Oral, Daily with breakfast   SYRINGE-NEEDLE, DISP, 3 ML (BD SAFETYGLIDE SYRINGE/NEEDLE) 25G X 1" 3 ML MISC Use for B12 injections   Vitamin D (Ergocalciferol) (DRISDOL) 50,000 Units, Oral, Every 7 days     Patient Active Problem List   Diagnosis Date Noted   Tendinitis of left triceps 05/05/2021   Acute right-sided thoracic back pain 12/19/2019   Vitamin D deficiency 05/07/2019   Vitamin B12 deficiency 05/07/2019   IGT (impaired glucose tolerance) 05/07/2019   Hyperlipidemia 05/07/2019   Obesity (BMI 35.0-39.9 without comorbidity)    GERD (gastroesophageal reflux disease)       Review of Systems  HENT:  Negative for rhinorrhea.   Respiratory:  Positive for cough. Negative for shortness of breath.   Cardiovascular:  Negative for chest pain.  Neurological:  Negative for headaches.  All other systems reviewed and are negative.     Objective:     BP 128/80 (BP Location: Right Arm, Patient Position: Sitting, Cuff Size: Large)   Pulse 76   Temp 98.1 F (36.7 C) (Oral)   Ht 5\' 9"  (1.753 m)   Wt 256 lb 2 oz (116.2 kg)   SpO2 97%   BMI 37.82 kg/m    Physical Exam Vitals reviewed.  Constitutional:      Appearance: Normal appearance. He is well-groomed. He is obese.  HENT:     Right Ear: Tympanic membrane normal.     Left Ear:  Tympanic membrane normal.  Eyes:     Extraocular Movements: Extraocular movements intact.     Conjunctiva/sclera: Conjunctivae normal.  Neck:     Thyroid: No thyromegaly.  Cardiovascular:     Rate and Rhythm: Normal rate and regular rhythm.     Heart sounds: S1 normal and S2 normal. No murmur heard. Pulmonary:     Effort: Pulmonary effort is normal.     Breath sounds: Normal air entry. Examination of the right-lower field reveals rhonchi. Rhonchi present. No wheezing or rales.  Musculoskeletal:     Right lower leg: No edema.     Left lower leg: No edema.  Neurological:     General: No focal deficit  present.     Mental Status: He is alert and oriented to person, place, and time.     Gait: Gait is intact.  Psychiatric:        Mood and Affect: Mood and affect normal.      No results found for any visits on 12/17/21.    The 10-year ASCVD risk score (Arnett DK, et al., 2019) is: 8.8%    Assessment & Plan:   Problem List Items Addressed This Visit   None Visit Diagnoses     Community acquired pneumonia of right lower lobe of lung    -  Primary   Relevant Medications   >10 days of illness, there are rhonchi/crackles in the RLL on exam, concern is for superimposed bacterial infection after having a viral URI. I will order abx and prednisone burst for empiric treatment. I will order a CXR to confirm the diagnosis and to rule out other pathology. Work excuse written for patient.   azithromycin (ZITHROMAX) 250 MG tablet   cefdinir (OMNICEF) 300 MG capsule   Other Relevant Orders   DG Chest 2 View   Acute bronchitis due to other specified organisms       Relevant Medications   No wheezing however his cough sounds very tight on exam, he has a history of asthma in the past. Ordering steroids today. azithromycin (ZITHROMAX) 250 MG tablet   cefdinir (OMNICEF) 300 MG capsule   predniSONE (DELTASONE) 20 MG tablet       No follow-ups on file.    Karie Georges, MD

## 2021-12-20 ENCOUNTER — Telehealth: Payer: Self-pay | Admitting: Internal Medicine

## 2021-12-20 NOTE — Telephone Encounter (Signed)
Pt called requesting a call back to go over his xray results from 12/17/21.

## 2021-12-20 NOTE — Progress Notes (Signed)
CXR is clear, no acute findings but I would recommend he finish the antibiotics I gave him at the visit.

## 2021-12-28 ENCOUNTER — Encounter: Payer: Self-pay | Admitting: Family Medicine

## 2021-12-28 ENCOUNTER — Telehealth: Payer: Self-pay | Admitting: Family Medicine

## 2021-12-28 NOTE — Telephone Encounter (Signed)
Left a detailed message with the information below at the patient's cell number and it looks like the note was sent via Mychart.

## 2021-12-28 NOTE — Telephone Encounter (Signed)
I wrote him one through November 7th which was what I had originally recommended. I wrote a new one for him through 11/9

## 2021-12-28 NOTE — Telephone Encounter (Signed)
Pt saw dr Casimiro Needle on 12-17-2021 and work note needs to be revise and put in his mychart

## 2021-12-28 NOTE — Telephone Encounter (Signed)
I left a detailed message at the patient's cell number asking if he needs another copy of the work note sent via Mychart and/or requested he call back with further information as to what is referring that needs to be revised in the previous letter.

## 2021-12-28 NOTE — Telephone Encounter (Signed)
Pt saw Dr. Casimiro Needle on 12/17/21. Pt is saying Dr. Casimiro Needle was supposed to upload a Doctors Note onto Pt's Mychart giving him leave from work until 12/23/21.   Pt says he really needs this letter asap, for work.

## 2022-01-04 ENCOUNTER — Encounter: Payer: Self-pay | Admitting: Internal Medicine

## 2022-01-04 ENCOUNTER — Ambulatory Visit: Payer: BC Managed Care – PPO | Admitting: Internal Medicine

## 2022-01-04 VITALS — BP 120/80 | HR 70 | Temp 98.1°F | Wt 262.1 lb

## 2022-01-04 DIAGNOSIS — I10 Essential (primary) hypertension: Secondary | ICD-10-CM | POA: Diagnosis not present

## 2022-01-04 NOTE — Progress Notes (Signed)
Established Patient Office Visit     CC/Reason for Visit: Follow-up blood pressure  HPI: Jacob Williamson is a 51 y.o. male who is coming in today for the above mentioned reasons.  He was diagnosed with hypertension beginning of October and is here today for follow-up.  He was started on amlodipine 5 mg daily.  He is doing well and has no acute concerns or complaints.   Past Medical/Surgical History: Past Medical History:  Diagnosis Date   Asthma    past hx - not current    Diverticulosis    sigmoid colon and descending colon   GERD (gastroesophageal reflux disease)    Hyperlipidemia    no medications    Obesity (BMI 35.0-39.9 without comorbidity)     Past Surgical History:  Procedure Laterality Date   COLONOSCOPY  05/20/2019    Social History:  reports that he has quit smoking. He has never used smokeless tobacco. He reports current alcohol use. He reports that he does not use drugs.  Allergies: Allergies  Allergen Reactions   Shellfish Allergy Anaphylaxis, Itching and Swelling   Catfish [Fish Allergy] Itching and Other (See Comments)    Mainly focused on throat and inside mouth, itching    Other     [place] nuts    Family History:  Family History  Problem Relation Age of Onset   Hypertension Mother    Diabetes Mother    Hypertension Father    Asthma Father    Diabetes Maternal Grandmother    Colon cancer Neg Hx    Colon polyps Neg Hx    Esophageal cancer Neg Hx    Rectal cancer Neg Hx    Stomach cancer Neg Hx      Current Outpatient Medications:    amLODipine (NORVASC) 5 MG tablet, Take 1 tablet (5 mg total) by mouth daily., Disp: 90 tablet, Rfl: 1   BLACK CURRANT SEED OIL PO, Take by mouth daily., Disp: , Rfl:    cyanocobalamin (VITAMIN B12) 1000 MCG/ML injection, Inject 47ml in deltoid once weekly for 4 weeks, then inject 1 ml once a month thereafter, Disp: 6 mL, Rfl: 1   Multiple Vitamin (MULTIVITAMIN) tablet, Take 1 tablet by mouth  daily., Disp: , Rfl:    naproxen (NAPROSYN) 500 MG tablet, Take 1 tablet (500 mg total) by mouth 2 (two) times daily with a meal., Disp: 30 tablet, Rfl: 0   nitroGLYCERIN (NITRODUR - DOSED IN MG/24 HR) 0.2 mg/hr patch, Place 1 patch (0.2 mg total) onto the skin daily. Place 1/4 of the patch over the affected area., Disp: 30 patch, Rfl: 1   nystatin (MYCOSTATIN/NYSTOP) powder, Apply 1 application topically 3 (three) times daily., Disp: 15 g, Rfl: 1   SYRINGE-NEEDLE, DISP, 3 ML (BD SAFETYGLIDE SYRINGE/NEEDLE) 25G X 1" 3 ML MISC, Use for B12 injections, Disp: 100 each, Rfl: 11   Vitamin D, Ergocalciferol, (DRISDOL) 1.25 MG (50000 UNIT) CAPS capsule, Take 1 capsule (50,000 Units total) by mouth every 7 (seven) days for 12 doses., Disp: 12 capsule, Rfl: 0  Review of Systems:  Constitutional: Denies fever, chills, diaphoresis, appetite change and fatigue.  HEENT: Denies photophobia, eye pain, redness, hearing loss, ear pain, congestion, sore throat, rhinorrhea, sneezing, mouth sores, trouble swallowing, neck pain, neck stiffness and tinnitus.   Respiratory: Denies SOB, DOE, cough, chest tightness,  and wheezing.   Cardiovascular: Denies chest pain, palpitations and leg swelling.  Gastrointestinal: Denies nausea, vomiting, abdominal pain, diarrhea, constipation, blood in stool and  abdominal distention.  Genitourinary: Denies dysuria, urgency, frequency, hematuria, flank pain and difficulty urinating.  Endocrine: Denies: hot or cold intolerance, sweats, changes in hair or nails, polyuria, polydipsia. Musculoskeletal: Denies myalgias, back pain, joint swelling, arthralgias and gait problem.  Skin: Denies pallor, rash and wound.  Neurological: Denies dizziness, seizures, syncope, weakness, light-headedness, numbness and headaches.  Hematological: Denies adenopathy. Easy bruising, personal or family bleeding history  Psychiatric/Behavioral: Denies suicidal ideation, mood changes, confusion, nervousness,  sleep disturbance and agitation    Physical Exam: Vitals:   01/04/22 1451  BP: 120/80  Pulse: 70  Temp: 98.1 F (36.7 C)  TempSrc: Oral  SpO2: 97%  Weight: 262 lb 1.6 oz (118.9 kg)    Body mass index is 38.71 kg/m.   Constitutional: NAD, calm, comfortable Eyes: PERRL, lids and conjunctivae normal ENMT: Mucous membranes are moist.  Respiratory: clear to auscultation bilaterally, no wheezing, no crackles. Normal respiratory effort. No accessory muscle use.  Cardiovascular: Regular rate and rhythm, no murmurs / rubs / gallops. No extremity edema.  Psychiatric: Normal judgment and insight. Alert and oriented x 3. Normal mood.    Impression and Plan:  Primary hypertension  -Blood pressure is well-controlled on amlodipine 5 mg. -He will continue ambulatory blood pressure monitoring and return in 3 months for follow-up.  Time spent:23 minutes reviewing chart, interviewing and examining patient and formulating plan of care.       Chaya Jan, MD [facility] at [facility] [place]

## 2022-01-19 ENCOUNTER — Telehealth: Payer: Self-pay | Admitting: Internal Medicine

## 2022-01-19 DIAGNOSIS — Z87891 Personal history of nicotine dependence: Secondary | ICD-10-CM | POA: Diagnosis not present

## 2022-01-19 DIAGNOSIS — Z91018 Allergy to other foods: Secondary | ICD-10-CM | POA: Diagnosis not present

## 2022-01-19 DIAGNOSIS — Z91013 Allergy to seafood: Secondary | ICD-10-CM | POA: Diagnosis not present

## 2022-01-19 DIAGNOSIS — R0789 Other chest pain: Secondary | ICD-10-CM | POA: Diagnosis not present

## 2022-01-19 DIAGNOSIS — R079 Chest pain, unspecified: Secondary | ICD-10-CM | POA: Diagnosis not present

## 2022-01-19 DIAGNOSIS — R9431 Abnormal electrocardiogram [ECG] [EKG]: Secondary | ICD-10-CM | POA: Diagnosis not present

## 2022-01-19 DIAGNOSIS — R0602 Shortness of breath: Secondary | ICD-10-CM | POA: Diagnosis not present

## 2022-01-19 DIAGNOSIS — I10 Essential (primary) hypertension: Secondary | ICD-10-CM | POA: Diagnosis not present

## 2022-01-19 NOTE — Telephone Encounter (Signed)
FYI Spoke with patient and he is still having chest pain that is radiating to his back.  I advised the patient to go to the ED.  Patient states that he already went to the ED and they told him to contact his PCP.  Appointment scheduled.  Patient is aware if the symptoms do not improve or worsen he will need to go back to the ED.

## 2022-01-19 NOTE — Telephone Encounter (Signed)
Baird Lyons RN is calling and the pt was told to go back to ER for chest pain and pt declined

## 2022-01-19 NOTE — Telephone Encounter (Signed)
Left message on machine for patient to return our call 

## 2022-01-20 NOTE — Telephone Encounter (Signed)
Pt is calling and he is going back to ED [facility] for his chest pain

## 2022-01-21 DIAGNOSIS — M25511 Pain in right shoulder: Secondary | ICD-10-CM | POA: Diagnosis not present

## 2022-01-24 ENCOUNTER — Encounter: Payer: Self-pay | Admitting: Internal Medicine

## 2022-01-24 ENCOUNTER — Ambulatory Visit: Payer: BC Managed Care – PPO | Admitting: Internal Medicine

## 2022-01-24 VITALS — BP 130/80 | HR 73 | Temp 98.2°F | Wt 263.9 lb

## 2022-01-24 DIAGNOSIS — Z09 Encounter for follow-up examination after completed treatment for conditions other than malignant neoplasm: Secondary | ICD-10-CM | POA: Diagnosis not present

## 2022-01-24 DIAGNOSIS — G8929 Other chronic pain: Secondary | ICD-10-CM

## 2022-01-24 DIAGNOSIS — M25511 Pain in right shoulder: Secondary | ICD-10-CM

## 2022-01-24 NOTE — Progress Notes (Signed)
Established Patient Office Visit     CC/Reason for Visit: ED follow-up  HPI: Jacob Williamson is a 51 y.o. male who is coming in today for the above mentioned reasons. Past Medical History is significant for: Hypertension, vitamin D deficiency, hyperlipidemia, impaired glucose tolerance.  He was seen in the emergency department of outside hospital on December 6 for right-sided chest pain.  All workup was negative including chest x-ray, EKG, troponins, CT angio of the chest.  Pain was worse when lying down on the right side, he was concerned he was having a heart attack which prompted his ED evaluation.  He has now come to realize that the pain is more related to his right shoulder.  He is known to have bursitis.   Past Medical/Surgical History: Past Medical History:  Diagnosis Date   Asthma    past hx - not current    Diverticulosis    sigmoid colon and descending colon   GERD (gastroesophageal reflux disease)    Hyperlipidemia    no medications    Obesity (BMI 35.0-39.9 without comorbidity)     Past Surgical History:  Procedure Laterality Date   COLONOSCOPY  05/20/2019    Social History:  reports that he has quit smoking. He has never used smokeless tobacco. He reports current alcohol use. He reports that he does not use drugs.  Allergies: Allergies  Allergen Reactions   Shellfish Allergy Anaphylaxis, Itching and Swelling   Catfish [Fish Allergy] Itching and Other (See Comments)    Mainly focused on throat and inside mouth, itching    Other     [place] nuts    Family History:  Family History  Problem Relation Age of Onset   Hypertension Mother    Diabetes Mother    Hypertension Father    Asthma Father    Diabetes Maternal Grandmother    Colon cancer Neg Hx    Colon polyps Neg Hx    Esophageal cancer Neg Hx    Rectal cancer Neg Hx    Stomach cancer Neg Hx      Current Outpatient Medications:    amLODipine (NORVASC) 5 MG tablet, Take 1 tablet (5  mg total) by mouth daily., Disp: 90 tablet, Rfl: 1   BLACK CURRANT SEED OIL PO, Take by mouth daily., Disp: , Rfl:    cyanocobalamin (VITAMIN B12) 1000 MCG/ML injection, Inject 47ml in deltoid once weekly for 4 weeks, then inject 1 ml once a month thereafter, Disp: 6 mL, Rfl: 1   Multiple Vitamin (MULTIVITAMIN) tablet, Take 1 tablet by mouth daily., Disp: , Rfl:    naproxen (NAPROSYN) 500 MG tablet, Take 1 tablet (500 mg total) by mouth 2 (two) times daily with a meal., Disp: 30 tablet, Rfl: 0   nitroGLYCERIN (NITRODUR - DOSED IN MG/24 HR) 0.2 mg/hr patch, Place 1 patch (0.2 mg total) onto the skin daily. Place 1/4 of the patch over the affected area., Disp: 30 patch, Rfl: 1   nystatin (MYCOSTATIN/NYSTOP) powder, Apply 1 application topically 3 (three) times daily., Disp: 15 g, Rfl: 1   SYRINGE-NEEDLE, DISP, 3 ML (BD SAFETYGLIDE SYRINGE/NEEDLE) 25G X 1" 3 ML MISC, Use for B12 injections, Disp: 100 each, Rfl: 11   Vitamin D, Ergocalciferol, (DRISDOL) 1.25 MG (50000 UNIT) CAPS capsule, Take 1 capsule (50,000 Units total) by mouth every 7 (seven) days for 12 doses., Disp: 12 capsule, Rfl: 0  Review of Systems:  Constitutional: Denies fever, chills, diaphoresis, appetite change and fatigue.  HEENT:  Denies photophobia, eye pain, redness, hearing loss, ear pain, congestion, sore throat, rhinorrhea, sneezing, mouth sores, trouble swallowing, neck pain, neck stiffness and tinnitus.   Respiratory: Denies SOB, DOE, cough, chest tightness,  and wheezing.   Cardiovascular: Denies chest pain, palpitations and leg swelling.  Gastrointestinal: Denies nausea, vomiting, abdominal pain, diarrhea, constipation, blood in stool and abdominal distention.  Genitourinary: Denies dysuria, urgency, frequency, hematuria, flank pain and difficulty urinating.  Endocrine: Denies: hot or cold intolerance, sweats, changes in hair or nails, polyuria, polydipsia. Musculoskeletal: Denies myalgias, back pain, joint swelling,  arthralgias and gait problem.  Skin: Denies pallor, rash and wound.  Neurological: Denies dizziness, seizures, syncope, weakness, light-headedness, numbness and headaches.  Hematological: Denies adenopathy. Easy bruising, personal or family bleeding history  Psychiatric/Behavioral: Denies suicidal ideation, mood changes, confusion, nervousness, sleep disturbance and agitation    Physical Exam: Vitals:   01/24/22 1529  BP: 130/80  Pulse: 73  Temp: 98.2 F (36.8 C)  TempSrc: Oral  SpO2: 97%  Weight: 263 lb 14.4 oz (119.7 kg)    Body mass index is 38.97 kg/m.   Constitutional: NAD, calm, comfortable Eyes: PERRL, lids and conjunctivae normal, wears corrective lenses ENMT: Mucous membranes are moist.  Respiratory: clear to auscultation bilaterally, no wheezing, no crackles. Normal respiratory effort. No accessory muscle use.  Cardiovascular: Regular rate and rhythm, no murmurs / rubs / gallops. No extremity edema.   Psychiatric: Normal judgment and insight. Alert and oriented x 3. Normal mood.    Impression and Plan:  Hospital discharge follow-up  Chronic right shoulder pain  -ED charts from outside hospital have been reviewed in detail. -He presented with chest pain, chest x-ray, EKG, labs including troponin and CT angio were all normal. -It is believed that his right-sided chest pain is related to his right shoulder bursitis.  He had an initial cortisone injection back in October and was 100% better afterwards but has since worn off.  Have advised that he follow-up with orthopedist again.  Time spent:31 minutes reviewing chart, interviewing and examining patient and formulating plan of care.       Chaya Jan, MD [facility]

## 2022-01-25 DIAGNOSIS — M25511 Pain in right shoulder: Secondary | ICD-10-CM | POA: Diagnosis not present

## 2022-01-25 DIAGNOSIS — Z01818 Encounter for other preprocedural examination: Secondary | ICD-10-CM | POA: Diagnosis not present

## 2022-02-13 DIAGNOSIS — M25511 Pain in right shoulder: Secondary | ICD-10-CM | POA: Diagnosis not present

## 2022-02-23 DIAGNOSIS — M75101 Unspecified rotator cuff tear or rupture of right shoulder, not specified as traumatic: Secondary | ICD-10-CM | POA: Diagnosis not present

## 2022-03-11 DIAGNOSIS — M75101 Unspecified rotator cuff tear or rupture of right shoulder, not specified as traumatic: Secondary | ICD-10-CM | POA: Diagnosis not present

## 2022-05-05 DIAGNOSIS — M9907 Segmental and somatic dysfunction of upper extremity: Secondary | ICD-10-CM | POA: Diagnosis not present

## 2022-05-05 DIAGNOSIS — M25511 Pain in right shoulder: Secondary | ICD-10-CM | POA: Diagnosis not present

## 2022-05-10 DIAGNOSIS — M9907 Segmental and somatic dysfunction of upper extremity: Secondary | ICD-10-CM | POA: Diagnosis not present

## 2022-05-10 DIAGNOSIS — M25511 Pain in right shoulder: Secondary | ICD-10-CM | POA: Diagnosis not present

## 2022-05-16 DIAGNOSIS — M25511 Pain in right shoulder: Secondary | ICD-10-CM | POA: Diagnosis not present

## 2022-05-16 DIAGNOSIS — M9907 Segmental and somatic dysfunction of upper extremity: Secondary | ICD-10-CM | POA: Diagnosis not present

## 2022-05-18 DIAGNOSIS — M9907 Segmental and somatic dysfunction of upper extremity: Secondary | ICD-10-CM | POA: Diagnosis not present

## 2022-05-18 DIAGNOSIS — M25511 Pain in right shoulder: Secondary | ICD-10-CM | POA: Diagnosis not present

## 2022-05-24 DIAGNOSIS — M9907 Segmental and somatic dysfunction of upper extremity: Secondary | ICD-10-CM | POA: Diagnosis not present

## 2022-05-24 DIAGNOSIS — M25511 Pain in right shoulder: Secondary | ICD-10-CM | POA: Diagnosis not present

## 2022-05-31 DIAGNOSIS — M9907 Segmental and somatic dysfunction of upper extremity: Secondary | ICD-10-CM | POA: Diagnosis not present

## 2022-05-31 DIAGNOSIS — M25511 Pain in right shoulder: Secondary | ICD-10-CM | POA: Diagnosis not present

## 2022-06-02 DIAGNOSIS — M25511 Pain in right shoulder: Secondary | ICD-10-CM | POA: Diagnosis not present

## 2022-06-02 DIAGNOSIS — M9907 Segmental and somatic dysfunction of upper extremity: Secondary | ICD-10-CM | POA: Diagnosis not present

## 2022-06-08 DIAGNOSIS — M25511 Pain in right shoulder: Secondary | ICD-10-CM | POA: Diagnosis not present

## 2022-06-08 DIAGNOSIS — M9907 Segmental and somatic dysfunction of upper extremity: Secondary | ICD-10-CM | POA: Diagnosis not present

## 2022-06-10 DIAGNOSIS — M9907 Segmental and somatic dysfunction of upper extremity: Secondary | ICD-10-CM | POA: Diagnosis not present

## 2022-06-10 DIAGNOSIS — M25511 Pain in right shoulder: Secondary | ICD-10-CM | POA: Diagnosis not present

## 2022-06-14 DIAGNOSIS — M9907 Segmental and somatic dysfunction of upper extremity: Secondary | ICD-10-CM | POA: Diagnosis not present

## 2022-06-14 DIAGNOSIS — M25511 Pain in right shoulder: Secondary | ICD-10-CM | POA: Diagnosis not present

## 2022-06-21 DIAGNOSIS — M25511 Pain in right shoulder: Secondary | ICD-10-CM | POA: Diagnosis not present

## 2022-06-21 DIAGNOSIS — M9907 Segmental and somatic dysfunction of upper extremity: Secondary | ICD-10-CM | POA: Diagnosis not present

## 2022-08-04 ENCOUNTER — Encounter: Payer: Self-pay | Admitting: Physician Assistant

## 2022-08-04 ENCOUNTER — Telehealth (INDEPENDENT_AMBULATORY_CARE_PROVIDER_SITE_OTHER): Payer: BC Managed Care – PPO | Admitting: Physician Assistant

## 2022-08-04 VITALS — Ht 69.0 in | Wt 263.0 lb

## 2022-08-04 DIAGNOSIS — K5792 Diverticulitis of intestine, part unspecified, without perforation or abscess without bleeding: Secondary | ICD-10-CM

## 2022-08-04 MED ORDER — AMOXICILLIN-POT CLAVULANATE 875-125 MG PO TABS: 1.0000 | ORAL_TABLET | Freq: Two times a day (BID) | ORAL | 0 refills | Status: DC

## 2022-08-04 NOTE — Patient Instructions (Signed)
I think you have a flare up of diverticulitis. Please take the augmentin as directed. Liquid-based diet for the next 24-48 hours, then only soft /bland foods after that.  IF any severe pain, uncontrollable vomiting or fevers, or blood in the stool, present to the emergency department.  Follow up with your PCP next week.

## 2022-08-04 NOTE — Progress Notes (Signed)
   Virtual Visit via Video Note  I connected with  Jacob Williamson  on 08/04/22 at  1:00 PM EDT by a video enabled telemedicine application and verified that I am speaking with the correct person using two identifiers.  Location: Patient: home Provider: Nature conservation officer at [employer] Persons present: Patient and myself   I discussed the limitations of evaluation and management by telemedicine and the availability of in person appointments. The patient expressed understanding and agreed to proceed.   History of Present Illness:  Symptoms started yesterday (08/03/22) after eating chick-fil-a biscuit, hash browns, iced coffee. [place] he felt immediately 'off' eating this meal. A co-worker's perfume made him nauseated. Then did a detox. Woke up this morning with a fever. A lot of abdominal cramping / pain. Feels tired. No vomiting or diarrhea. Pressure pain in abdomen when pushing on left side. Urinating fine.    Observations/Objective:   Gen: Awake, alert, no acute distress Resp: Breathing is even and non-labored Abdomen: Complains of pain along left side of abdomen when he pushes Psych: calm/pleasant demeanor Neuro: Alert and Oriented x 3, + facial symmetry, speech is clear.   Assessment and Plan:  1. Acute diverticulitis I personally reviewed his colonoscopy from 05/20/19 - sigmoid diverticulosis noted. Symptoms seem c/w acute flare-up. No red flags noted during our discussion today. Will start on augmentin twice daily x 10 days. Liquid diet 24-48 hrs, then soft foods. Red flags / ER precautions discussed. He's agreeable with plan. Work note provided.   Follow Up Instructions:    I discussed the assessment and treatment plan with the patient. The patient was provided an opportunity to ask questions and all were answered. The patient agreed with the plan and demonstrated an understanding of the instructions.   The patient was advised to call back or seek an  in-person evaluation if the symptoms worsen or if the condition fails to improve as anticipated.  Pinchos Topel M Jonnathan Birman, PA-C

## 2022-08-17 ENCOUNTER — Ambulatory Visit (INDEPENDENT_AMBULATORY_CARE_PROVIDER_SITE_OTHER): Payer: BC Managed Care – PPO | Admitting: Internal Medicine

## 2022-08-17 ENCOUNTER — Encounter: Payer: Self-pay | Admitting: Internal Medicine

## 2022-08-17 VITALS — BP 120/88 | HR 85 | Temp 98.5°F | Ht 69.0 in | Wt 244.0 lb

## 2022-08-17 DIAGNOSIS — Z114 Encounter for screening for human immunodeficiency virus [HIV]: Secondary | ICD-10-CM

## 2022-08-17 DIAGNOSIS — E785 Hyperlipidemia, unspecified: Secondary | ICD-10-CM

## 2022-08-17 DIAGNOSIS — E559 Vitamin D deficiency, unspecified: Secondary | ICD-10-CM

## 2022-08-17 DIAGNOSIS — R7303 Prediabetes: Secondary | ICD-10-CM

## 2022-08-17 DIAGNOSIS — R7302 Impaired glucose tolerance (oral): Secondary | ICD-10-CM | POA: Diagnosis not present

## 2022-08-17 DIAGNOSIS — Z1159 Encounter for screening for other viral diseases: Secondary | ICD-10-CM

## 2022-08-17 DIAGNOSIS — E669 Obesity, unspecified: Secondary | ICD-10-CM

## 2022-08-17 LAB — POCT GLYCOSYLATED HEMOGLOBIN (HGB A1C): Hemoglobin A1C: 6 % — AB (ref 4.0–5.6)

## 2022-08-17 LAB — VITAMIN D 25 HYDROXY (VIT D DEFICIENCY, FRACTURES): VITD: 15.02 ng/mL — ABNORMAL LOW (ref 30.00–100.00)

## 2022-08-17 NOTE — Assessment & Plan Note (Signed)
-  Discussed healthy lifestyle, including increased physical activity and better food choices to promote weight loss. -Congratulated on weight loss success thus far. 

## 2022-08-17 NOTE — Assessment & Plan Note (Signed)
Stable to slightly improved with an A1c of 6.0 today.  Continue to work on lifestyle changes as he is.

## 2022-08-17 NOTE — Patient Instructions (Signed)
Health Maintenance Due  Topic Date Due   COVID-19 Vaccine (1) Never done   HIV Screening  Never done   Hepatitis C Screening  Never done      Row Labels 01/24/2022    3:37 PM 01/04/2022    3:19 PM 12/17/2021    9:50 AM  Depression screen PHQ 2/9   Section Header. No data exists in this row.     Decreased Interest   0 0 0  Down, Depressed, Hopeless   0 0 0  PHQ - 2 Score   0 0 0  Altered sleeping   0 0 1  Tired, decreased energy   0 0 1  Change in appetite   0 0 0  Feeling bad or failure about yourself    0 0 0  Trouble concentrating   0 0 0  Moving slowly or fidgety/restless   0 0 0  Suicidal thoughts   0 0 0  PHQ-9 Score   0 0 2  Difficult doing work/chores   Not difficult at all Not difficult at all Not difficult at all

## 2022-08-17 NOTE — Progress Notes (Signed)
Established Patient Office Visit     CC/Reason for Visit: Follow-up chronic conditions  HPI: Jacob Williamson is a 52 y.o. male who is coming in today for the above mentioned reasons. Past Medical History is significant for: Hypertension, hyperlipidemia not on medication, impaired glucose tolerance, vitamin D deficiency.  He was seen virtually on 6/20 for abdominal pain and diarrhea.  He was diagnosed with presumptive acute diverticulitis and treated with 10 days of Augmentin.  He is significantly improved.  He has changed his diet and is eating healthier.  He has lost about 10 pounds.   Past Medical/Surgical History: Past Medical History:  Diagnosis Date   Asthma    past hx - not current    Diverticulosis    sigmoid colon and descending colon   GERD (gastroesophageal reflux disease)    Hyperlipidemia    no medications    Obesity (BMI 35.0-39.9 without comorbidity)     Past Surgical History:  Procedure Laterality Date   COLONOSCOPY  05/20/2019    Social History:  reports that he has quit smoking. He has never used smokeless tobacco. He reports current alcohol use. He reports that he does not use drugs.  Allergies: Allergies  Allergen Reactions   Shellfish Allergy Anaphylaxis, Itching and Swelling   Catfish [Fish Allergy] Itching and Other (See Comments)    Mainly focused on throat and inside mouth, itching    Other     [place] nuts    Family History:  Family History  Problem Relation Age of Onset   Hypertension Mother    Diabetes Mother    Hypertension Father    Asthma Father    Diabetes Maternal Grandmother    Colon cancer Neg Hx    Colon polyps Neg Hx    Esophageal cancer Neg Hx    Rectal cancer Neg Hx    Stomach cancer Neg Hx      Current Outpatient Medications:    amLODipine (NORVASC) 5 MG tablet, Take 1 tablet (5 mg total) by mouth daily., Disp: 90 tablet, Rfl: 1   amoxicillin-clavulanate (AUGMENTIN) 875-125 MG tablet, Take 1 tablet by  mouth 2 (two) times daily., Disp: 20 tablet, Rfl: 0   BLACK CURRANT SEED OIL PO, Take by mouth daily., Disp: , Rfl:    cyanocobalamin (VITAMIN B12) 1000 MCG/ML injection, Inject 1ml in deltoid once weekly for 4 weeks, then inject 1 ml once a month thereafter, Disp: 6 mL, Rfl: 1   Multiple Vitamin (MULTIVITAMIN) tablet, Take 1 tablet by mouth daily., Disp: , Rfl:    naproxen (NAPROSYN) 500 MG tablet, Take 1 tablet (500 mg total) by mouth 2 (two) times daily with a meal., Disp: 30 tablet, Rfl: 0   nitroGLYCERIN (NITRODUR - DOSED IN MG/24 HR) 0.2 mg/hr patch, Place 1 patch (0.2 mg total) onto the skin daily. Place 1/4 of the patch over the affected area., Disp: 30 patch, Rfl: 1   nystatin (MYCOSTATIN/NYSTOP) powder, Apply 1 application topically 3 (three) times daily., Disp: 15 g, Rfl: 1   SYRINGE-NEEDLE, DISP, 3 ML (BD SAFETYGLIDE SYRINGE/NEEDLE) 25G X 1" 3 ML MISC, Use for B12 injections, Disp: 100 each, Rfl: 11  Review of Systems:  Negative unless indicated in HPI.   Physical Exam: Vitals:   08/17/22 1005  BP: 120/88  Pulse: 85  Temp: 98.5 F (36.9 C)  TempSrc: Oral  SpO2: 99%  Weight: 244 lb (110.7 kg)  Height: 5\' 9"  (1.753 m)    Body mass index is  36.03 kg/m.   Physical Exam Vitals reviewed.  Constitutional:      Appearance: Normal appearance.  HENT:     Head: Normocephalic and atraumatic.  Eyes:     Conjunctiva/sclera: Conjunctivae normal.     Pupils: Pupils are equal, round, and reactive to light.  Cardiovascular:     Rate and Rhythm: Normal rate and regular rhythm.  Pulmonary:     Effort: Pulmonary effort is normal.     Breath sounds: Normal breath sounds.  Skin:    General: Skin is warm and dry.  Neurological:     General: No focal deficit present.     Mental Status: He is alert and oriented to person, place, and time.  Psychiatric:        Mood and Affect: Mood normal.        Behavior: Behavior normal.        Thought Content: Thought content normal.         Judgment: Judgment normal.      Impression and Plan:  Prediabetes -     POCT glycosylated hemoglobin (Hb A1C)  IGT (impaired glucose tolerance) Assessment & Plan: Stable to slightly improved with an A1c of 6.0 today.  Continue to work on lifestyle changes as he is.   Hyperlipidemia, unspecified hyperlipidemia type Assessment & Plan: Not currently on medication, most recent LDL was 141.   Vitamin D deficiency Assessment & Plan: Due for vitamin D recheck today after completing 12 weeks of high-dose supplementation.  Orders: -     VITAMIN D 25 Hydroxy (Vit-D Deficiency, Fractures); Future  Obesity (BMI 35.0-39.9 without comorbidity) Assessment & Plan: -Discussed healthy lifestyle, including increased physical activity and better food choices to promote weight loss. -Congratulated on weight loss success thus far.   Encounter for hepatitis C screening test for low risk patient -     Hepatitis C antibody; Future  Encounter for screening for HIV -     HIV Antibody (routine testing w rflx); Future     Time spent:32 minutes reviewing chart, interviewing and examining patient and formulating plan of care.     Chaya Jan, MD [facility] at [facility]

## 2022-08-17 NOTE — Assessment & Plan Note (Signed)
Not currently on medication, most recent LDL was 141.

## 2022-08-17 NOTE — Assessment & Plan Note (Signed)
Due for vitamin D recheck today after completing 12 weeks of high-dose supplementation.

## 2022-08-18 LAB — HIV ANTIBODY (ROUTINE TESTING W REFLEX): HIV 1&2 Ab, 4th Generation: NONREACTIVE

## 2022-08-18 LAB — HEPATITIS C ANTIBODY: Hepatitis C Ab: NONREACTIVE

## 2022-08-23 ENCOUNTER — Other Ambulatory Visit: Payer: Self-pay | Admitting: Internal Medicine

## 2022-08-23 DIAGNOSIS — E559 Vitamin D deficiency, unspecified: Secondary | ICD-10-CM

## 2022-08-23 MED ORDER — VITAMIN D (ERGOCALCIFEROL) 1.25 MG (50000 UNIT) PO CAPS
50000.0000 [IU] | ORAL_CAPSULE | ORAL | 0 refills | Status: AC
Start: 2022-08-23 — End: 2022-11-09

## 2022-09-02 ENCOUNTER — Ambulatory Visit (INDEPENDENT_AMBULATORY_CARE_PROVIDER_SITE_OTHER): Payer: BC Managed Care – PPO | Admitting: Family Medicine

## 2022-09-02 ENCOUNTER — Encounter: Payer: Self-pay | Admitting: Family Medicine

## 2022-09-02 VITALS — BP 144/88 | HR 78 | Temp 98.4°F | Wt 246.6 lb

## 2022-09-02 DIAGNOSIS — R062 Wheezing: Secondary | ICD-10-CM | POA: Diagnosis not present

## 2022-09-02 DIAGNOSIS — R051 Acute cough: Secondary | ICD-10-CM | POA: Diagnosis not present

## 2022-09-02 DIAGNOSIS — K219 Gastro-esophageal reflux disease without esophagitis: Secondary | ICD-10-CM | POA: Diagnosis not present

## 2022-09-02 MED ORDER — ALBUTEROL SULFATE HFA 108 (90 BASE) MCG/ACT IN AERS
2.0000 | INHALATION_SPRAY | Freq: Four times a day (QID) | RESPIRATORY_TRACT | 0 refills | Status: AC | PRN
Start: 2022-09-02 — End: ?

## 2022-09-02 NOTE — Progress Notes (Signed)
Established Patient Office Visit   Subjective  Patient ID: Jacob Williamson, male    DOB: 1970/12/01  Age: 53 y.o. MRN: [id number]  Chief Complaint  Patient presents with   Allergic Reaction    Not sure if it was ingested or environmental, woke up 2 nights ago with SOB and wheezing. States he is going out of town and would like a rescue inhaler in case it happens again    Pt is a 52 yo male followed by Dr. Ardyth Harps and seen for acute issue.  Pt thinks he had an allergic rxn to something as he developed cough, wheezing, congestion, and "itching in chest" 2 nights ago.  Pt denies eating anything different.  Eating a vegan diet due to h/o diverticulosis.  In th past had issues with asthma.  Requesting an albuterol inhaler in case sx return as going out of town in the next few days.  Took ginger and echinacea to "clean out system" after the sx.  Per chart review pt w/ h/o GERD.  Not on PPI.  Eating dinner late, around 8 pm or so then getting in bed. Losing wt.  Wants to get down to 235 or less.  Plans to start exercising consistently.  Job is physically demanding.    Patient Active Problem List   Diagnosis Date Noted   Tendinitis of left triceps 05/05/2021   Acute right-sided thoracic back pain 12/19/2019   Vitamin D deficiency 05/07/2019   Vitamin B12 deficiency 05/07/2019   IGT (impaired glucose tolerance) 05/07/2019   Hyperlipidemia 05/07/2019   Obesity (BMI 35.0-39.9 without comorbidity)    GERD (gastroesophageal reflux disease)    Past Surgical History:  Procedure Laterality Date   COLONOSCOPY  05/20/2019   Social History   Tobacco Use   Smoking status: Former   Smokeless tobacco: Never  Advertising account planner   Vaping status: Never Used  Substance Use Topics   Alcohol use: Yes    Comment: rarely   Drug use: No   Family History  Problem Relation Age of Onset   Hypertension Mother    Diabetes Mother    Hypertension Father    Asthma Father    Diabetes Maternal Grandmother     Colon cancer Neg Hx    Colon polyps Neg Hx    Esophageal cancer Neg Hx    Rectal cancer Neg Hx    Stomach cancer Neg Hx    Allergies  Allergen Reactions   Shellfish Allergy Anaphylaxis, Itching and Swelling   Catfish [Fish Allergy] Itching and Other (See Comments)    Mainly focused on throat and inside mouth, itching    Other     [place] nuts      ROS Negative unless stated above    Objective:     BP (!) 144/88 (BP Location: Right Arm, Patient Position: Sitting, Cuff Size: Large)   Pulse 78   Temp 98.4 F (36.9 C) (Oral)   Wt 246 lb 9.6 oz (111.9 kg)   SpO2 98%   BMI 36.42 kg/m    Physical Exam Constitutional:      General: He is not in acute distress.    Appearance: Normal appearance.  HENT:     Head: Normocephalic and atraumatic.     Nose: Nose normal.     Mouth/Throat:     Mouth: Mucous membranes are moist.  Cardiovascular:     Rate and Rhythm: Normal rate and regular rhythm.     Heart sounds: Normal heart sounds. No murmur  heard.    No gallop.  Pulmonary:     Effort: Pulmonary effort is normal. No respiratory distress.     Breath sounds: Normal breath sounds. No wheezing, rhonchi or rales.  Skin:    General: Skin is warm and dry.  Neurological:     Mental Status: He is alert and oriented to person, place, and time.      No results found for any visits on 09/02/22.    Assessment & Plan:  Gastroesophageal reflux disease, unspecified whether esophagitis present  Wheezing -     Albuterol Sulfate HFA; Inhale 2 puffs into the lungs every 6 (six) hours as needed for wheezing or shortness of breath.  Dispense: 8 g; Refill: 0  Acute cough  Recent acute sx likely 2/2 silent reflux.  Advised to wait 2 hrs after eating before laying down.  Continue lifestyle modifications and avoid foods known to cause problems.  Return if symptoms worsen or fail to improve.   Deeann Saint, MD

## 2022-09-13 ENCOUNTER — Telehealth: Payer: Self-pay

## 2022-09-13 NOTE — Telephone Encounter (Signed)
Pharmacy Patient Advocate Encounter  Received notification from [employer]  that Prior Authorization for Albuterol Sulfate HFA 108 (90 Base)MCG/ACT aerosol has been DENIED. Please advise how you'd like to proceed. Full denial letter will be uploaded to the media tab. See denial reason below.  PA #/Case ID/Reference #: [id number]  Denial Reason: Exclusion from coverage under drug plan. Covered alternatives may include: generic ProAir HFA

## 2022-09-16 ENCOUNTER — Other Ambulatory Visit (HOSPITAL_COMMUNITY): Payer: Self-pay

## 2022-09-16 NOTE — Telephone Encounter (Signed)
Ran test claim, came back refill too soon. Last filled 09/03/22, next fill is 09/22/22

## 2022-09-16 NOTE — Telephone Encounter (Signed)
Generic ProAir is fine.

## 2022-11-16 ENCOUNTER — Encounter: Payer: Self-pay | Admitting: Family Medicine

## 2022-11-16 ENCOUNTER — Ambulatory Visit: Payer: BC Managed Care – PPO | Admitting: Family Medicine

## 2022-11-16 VITALS — BP 128/84 | HR 79 | Temp 98.7°F | Wt 236.0 lb

## 2022-11-16 DIAGNOSIS — R062 Wheezing: Secondary | ICD-10-CM | POA: Diagnosis not present

## 2022-11-16 DIAGNOSIS — J40 Bronchitis, not specified as acute or chronic: Secondary | ICD-10-CM | POA: Diagnosis not present

## 2022-11-16 DIAGNOSIS — M79672 Pain in left foot: Secondary | ICD-10-CM | POA: Diagnosis not present

## 2022-11-16 MED ORDER — METHYLPREDNISOLONE ACETATE 80 MG/ML IJ SUSP
80.0000 mg | Freq: Once | INTRAMUSCULAR | Status: AC
Start: 2022-11-16 — End: 2022-11-16
  Administered 2022-11-16: 80 mg via INTRAMUSCULAR

## 2022-11-16 MED ORDER — METHYLPREDNISOLONE ACETATE 40 MG/ML IJ SUSP
40.0000 mg | Freq: Once | INTRAMUSCULAR | Status: AC
Start: 2022-11-16 — End: 2022-11-16
  Administered 2022-11-16: 40 mg via INTRAMUSCULAR

## 2022-11-16 MED ORDER — ALBUTEROL SULFATE HFA 108 (90 BASE) MCG/ACT IN AERS
2.0000 | INHALATION_SPRAY | Freq: Four times a day (QID) | RESPIRATORY_TRACT | 2 refills | Status: DC | PRN
Start: 2022-11-16 — End: 2023-12-15

## 2022-11-16 MED ORDER — AMOXICILLIN-POT CLAVULANATE 875-125 MG PO TABS: 1.0000 | ORAL_TABLET | Freq: Two times a day (BID) | ORAL | 0 refills | Status: DC

## 2022-11-16 NOTE — Progress Notes (Signed)
   Subjective:    Patient ID: Jacob Williamson, male    DOB: 1970-05-29, 52 y.o.   MRN: [id number]  HPI Here for 2 issues. First he cleaned out his attic 2 weeks ago and he breathed in a lot of dust. He was not wearing a mask. Since then he has been coughing and wheezing. The cough is deep at times, but it is dry. No fever. He gets SOB at times, and when this happens he gets relief by using his Albuterol inhaler. The other issue is the sudden onset of pain in the outside of his left foot several days ago. No recent trauma. It has not swelled or changed colors.    Review of Systems  Constitutional: Negative.   HENT: Negative.    Eyes: Negative.   Respiratory:  Positive for cough, chest tightness, shortness of breath and wheezing.   Cardiovascular: Negative.   Musculoskeletal:  Positive for arthralgias.       Objective:   Physical Exam Constitutional:      Appearance: Normal appearance.     Comments: He walks normally   HENT:     Right Ear: Tympanic membrane, ear canal and external ear normal.     Left Ear: Tympanic membrane, ear canal and external ear normal.     Nose: Nose normal.     Mouth/Throat:     Pharynx: Oropharynx is clear.  Eyes:     Conjunctiva/sclera: Conjunctivae normal.  Pulmonary:     Effort: Pulmonary effort is normal.     Breath sounds: Wheezing and rhonchi present. No rales.  Musculoskeletal:     Comments: The left foot appears normal. He is quite tender along the lateral side of the foot. The ankle is okay   Lymphadenopathy:     Cervical: No cervical adenopathy.  Neurological:     Mental Status: He is alert.           Assessment & Plan:  He has bronchitis with some bronchospasm. He is given a shot of DepoMedrol. We will treat this with 10 days of Augmentin. He can use the Albuterol inhaler as needed. The etiology of the foot pain is unclear, but we will obtain Xrays of it. He can take Ibuprofen and apply ice as needed.  Gershon Crane, MD

## 2022-11-17 ENCOUNTER — Telehealth: Payer: Self-pay | Admitting: Internal Medicine

## 2022-11-17 ENCOUNTER — Ambulatory Visit: Payer: BC Managed Care – PPO

## 2022-11-17 ENCOUNTER — Other Ambulatory Visit (INDEPENDENT_AMBULATORY_CARE_PROVIDER_SITE_OTHER): Payer: BC Managed Care – PPO

## 2022-11-17 DIAGNOSIS — E559 Vitamin D deficiency, unspecified: Secondary | ICD-10-CM

## 2022-11-17 DIAGNOSIS — M79672 Pain in left foot: Secondary | ICD-10-CM | POA: Diagnosis not present

## 2022-11-17 DIAGNOSIS — R7303 Prediabetes: Secondary | ICD-10-CM

## 2022-11-17 DIAGNOSIS — M19072 Primary osteoarthritis, left ankle and foot: Secondary | ICD-10-CM | POA: Diagnosis not present

## 2022-11-17 DIAGNOSIS — M84375A Stress fracture, left foot, initial encounter for fracture: Secondary | ICD-10-CM | POA: Diagnosis not present

## 2022-11-17 DIAGNOSIS — E785 Hyperlipidemia, unspecified: Secondary | ICD-10-CM

## 2022-11-17 NOTE — Telephone Encounter (Signed)
Patient states he saw Dr. Domenic Polite came in for an xray today and is wondering what Dr. Clent Ridges wants him to do.  He said he is really trying to find out if his foot is broke or not.

## 2022-11-18 NOTE — Telephone Encounter (Signed)
Pt called back, advised him of previous message, he is very anxious to get results and treatment as his job is not pleased with him missing work.

## 2022-11-18 NOTE — Telephone Encounter (Signed)
Left  detailed message for pt advised that Dr Clent Ridges has not received the x-ray results. Pt will be notified soon

## 2022-11-21 ENCOUNTER — Telehealth: Payer: Self-pay | Admitting: Internal Medicine

## 2022-11-21 ENCOUNTER — Other Ambulatory Visit: Payer: BC Managed Care – PPO

## 2022-11-21 NOTE — Telephone Encounter (Signed)
I saw the urgent care note. He can follow up as needed

## 2022-11-21 NOTE — Telephone Encounter (Signed)
See other encounter. X-ray from urgent care sent to Dr. Clent Ridges.

## 2022-11-21 NOTE — Telephone Encounter (Signed)
Pt went to urgent care on 10/3 and had x-ray done again.  Results for orders placed or performed in visit on 11/17/22  XR foot 3+ views left  Narrative  Exam X-Ray Views of the Foot  Comparison None provided.   Findings There is no soft tissue swelling identified.   There is no clear radiographic evidence for acute fracture on these views.   There is no radio opaque foreign body appreciated.   There are degenerative changes of the left great toe metatarsal phalangeal joint with joint space narrowing and osteophytes. Enthesophytes are present off the calcaneus at the Achilles tendon and plantar aponeurosis insertion.  Impression  Degenerative changes left foot and calcaneal spurs. No evidence of acute osseous injury.  Electronically signed by: Andrey Farmer, M.D. on 11/17/2022 16:42:02

## 2022-11-21 NOTE — Telephone Encounter (Signed)
Has he seen Orthopedics yet? If not, I suggest he go to the walk in clinic at [facility]. At that point, they will determine his course of treatment

## 2022-11-21 NOTE — Telephone Encounter (Signed)
Pt is calling checking on the results of xray

## 2022-11-21 NOTE — Telephone Encounter (Signed)
Please advise 

## 2022-11-21 NOTE — Telephone Encounter (Signed)
Tried to call pt to notify of message below but no answer.

## 2022-11-21 NOTE — Telephone Encounter (Addendum)
Pt was last seen on 11/16/22 by Dr. Clent Ridges. Pt injured his foot and had xrays done elsewhere. Pt would like to know if it would be okay for him to bring in his xrays for MD to review, so that a plan of care could be developed?  Please advise.

## 2022-11-21 NOTE — Telephone Encounter (Signed)
Pt calling, says his job is on him about getting a plan of care. Requests a call with an update

## 2022-11-22 NOTE — Telephone Encounter (Signed)
Pt notified of update and verbalized understanding.  

## 2022-11-23 ENCOUNTER — Ambulatory Visit (HOSPITAL_BASED_OUTPATIENT_CLINIC_OR_DEPARTMENT_OTHER): Payer: BC Managed Care – PPO | Admitting: Student

## 2022-11-25 ENCOUNTER — Ambulatory Visit (HOSPITAL_BASED_OUTPATIENT_CLINIC_OR_DEPARTMENT_OTHER): Payer: BC Managed Care – PPO | Admitting: Student

## 2022-11-25 ENCOUNTER — Encounter (HOSPITAL_BASED_OUTPATIENT_CLINIC_OR_DEPARTMENT_OTHER): Payer: Self-pay | Admitting: Student

## 2022-11-25 DIAGNOSIS — S9032XA Contusion of left foot, initial encounter: Secondary | ICD-10-CM

## 2022-11-25 NOTE — Telephone Encounter (Signed)
Pt notified via My Chart

## 2022-11-27 NOTE — Progress Notes (Signed)
Chief Complaint: Left foot pain     History of Present Illness:    Jacob Williamson is a 52 y.o. male presenting today for evaluation of lateral left foot pain.  He injured his foot about a week and a half ago when he states that he had been drinking and accidentally hit his foot against a bed frame.  He was seen in urgent care and was told that he has a possible stress fracture.  He was placed in a tall walking boot.  Pain continues to be moderate and he has been taking ibuprofen and hydrocodone for pain.  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  He does have to wear steel toed boots and stand for 8 hours a day.   Surgical History:   None  PMH/PSH/Family History/Social History/Meds/Allergies:    Past Medical History:  Diagnosis Date   Asthma    past hx - not current    Diverticulosis    sigmoid colon and descending colon   GERD (gastroesophageal reflux disease)    Hyperlipidemia    no medications    Obesity (BMI 35.0-39.9 without comorbidity)    Past Surgical History:  Procedure Laterality Date   COLONOSCOPY  05/20/2019   Social History   Socioeconomic History   Marital status: Single    Spouse name: Not on file   Number of children: Not on file   Years of education: Not on file   Highest education level: Not on file  Occupational History   Not on file  Tobacco Use   Smoking status: Former   Smokeless tobacco: Never  Vaping Use   Vaping status: Never Used  Substance and Sexual Activity   Alcohol use: Yes    Comment: rarely   Drug use: No   Sexual activity: Yes  Other Topics Concern   Not on file  Social History Narrative   Not on file   Social Determinants of Health   Financial Resource Strain: Not on file  Food Insecurity: Not on file  Transportation Needs: Not on file  Physical Activity: Not on file  Stress: Not on file  Social Connections: Unknown (06/25/2021)   Received from  [facility], [facility]   Social Network    Social Network: Not on file   Family History  Problem Relation Age of Onset   Hypertension Mother    Diabetes Mother    Hypertension Father    Asthma Father    Diabetes Maternal Grandmother    Colon cancer Neg Hx    Colon polyps Neg Hx    Esophageal cancer Neg Hx    Rectal cancer Neg Hx    Stomach cancer Neg Hx    Allergies  Allergen Reactions   Shellfish Allergy Anaphylaxis, Itching and Swelling   Catfish [Fish Allergy] Itching and Other (See Comments)    Mainly focused on throat and inside mouth, itching    Other     [place] nuts   Current Outpatient Medications  Medication Sig Dispense Refill   albuterol (VENTOLIN HFA) 108 (90 Base) MCG/ACT inhaler Inhale 2 puffs into the lungs every 6 (six) hours as needed for wheezing or shortness of breath. 8 g 2   amLODipine (NORVASC) 5 MG tablet Take 1 tablet (5 mg total) by  mouth daily. 90 tablet 1   amoxicillin-clavulanate (AUGMENTIN) 875-125 MG tablet Take 1 tablet by mouth 2 (two) times daily. 20 tablet 0   BLACK CURRANT SEED OIL PO Take by mouth daily.     cyanocobalamin (VITAMIN B12) 1000 MCG/ML injection Inject 1ml in deltoid once weekly for 4 weeks, then inject 1 ml once a month thereafter 6 mL 1   Multiple Vitamin (MULTIVITAMIN) tablet Take 1 tablet by mouth daily.     naproxen (NAPROSYN) 500 MG tablet Take 1 tablet (500 mg total) by mouth 2 (two) times daily with a meal. 30 tablet 0   nitroGLYCERIN (NITRODUR - DOSED IN MG/24 HR) 0.2 mg/hr patch Place 1 patch (0.2 mg total) onto the skin daily. Place 1/4 of the patch over the affected area. 30 patch 1   nystatin (MYCOSTATIN/NYSTOP) powder Apply 1 application topically 3 (three) times daily. 15 g 1   SYRINGE-NEEDLE, DISP, 3 ML (BD SAFETYGLIDE SYRINGE/NEEDLE) 25G X 1" 3 ML MISC Use for B12 injections 100 each 11   No current facility-administered medications for this visit.   No results found.  Review of Systems:   A ROS was  performed including pertinent positives and negatives as documented in the HPI.  Physical Exam :   Constitutional: NAD and appears stated age Neurological: Alert and oriented Psych: Appropriate affect and cooperative There were no vitals taken for this visit.   Comprehensive Musculoskeletal Exam:    Tenderness of the lateral left foot over the distal fifth metatarsal.  No tenderness of the fifth toe or over the fifth metatarsal base.  No tenderness over the fifth toe or fifth metatarsal base.  No tenderness of the medial or lateral malleolus with active ankle ROM of 20 degrees dorsiflexion and 30 degrees plantarflexion.  DP pulse 2+.  Neurosensory exam intact.  Imaging:   Xray (left foot 3 views): Negative   I personally reviewed and interpreted the radiographs.   Assessment:   52 y.o. male with lateral left foot pain after an injury 1.5 weeks ago.  Radiographs reviewed today from both urgent care and family medicine do not appear to show compelling evidence for fracture.  Given this I do believe he sustained a likely contusion as pain is directly over the metatarsal.  Unable to rule out sprain however this will not alter treatment plan.  I would like for him to begin weaning out of the boot back to a normal shoe as tolerated.  I did recommend using a postop shoe to transition between these however he did not tolerate this well so we will try the short boot.  Will plan to have him remain light duty for another 1 to 2 weeks and return to full duty at that time.  Can follow-up as needed.  Plan :    -Return to clinic as needed     I personally saw and evaluated the patient, and participated in the management and treatment plan.  Hazle Nordmann, PA-C Orthopedics

## 2022-12-01 ENCOUNTER — Telehealth: Payer: Self-pay | Admitting: Internal Medicine

## 2022-12-01 NOTE — Telephone Encounter (Signed)
Pt called to say he is still experiencing pain and would like to ask if MD could please send a refill of the Hydrocodone  I could not locate Rx on Pt's medication list.   Please send to: [facility] [address] [place] - [address] Phone: [phone]  Fax: [phone]

## 2022-12-05 NOTE — Telephone Encounter (Signed)
Patient is aware 

## 2022-12-12 ENCOUNTER — Ambulatory Visit (HOSPITAL_BASED_OUTPATIENT_CLINIC_OR_DEPARTMENT_OTHER): Payer: BC Managed Care – PPO | Admitting: Student

## 2022-12-12 ENCOUNTER — Encounter (HOSPITAL_BASED_OUTPATIENT_CLINIC_OR_DEPARTMENT_OTHER): Payer: Self-pay | Admitting: Student

## 2022-12-12 DIAGNOSIS — S9032XA Contusion of left foot, initial encounter: Secondary | ICD-10-CM

## 2022-12-12 NOTE — Progress Notes (Incomplete)
Chief Complaint: Left foot pain     History of Present Illness:   12/12/22: Patient presents today for follow-up of his left foot.  States that he is doing much better since last visit.  He has been able to transition out of the boot back into normal shoes.  He is still performing light duty at his job however last week he did begin wearing his work boots.  Pain levels have improved as he describes current symptoms as mild soreness.  He is taking ibuprofen as needed.   11/25/22: Maddax Rabine is a 52 y.o. male presenting today for evaluation of lateral left foot pain.  He injured his foot about a week and a half ago when he states that he had been drinking and accidentally hit his foot against a bed frame.  He was seen in urgent care and was told that he has a possible stress fracture.  He was placed in a tall walking boot.  Pain continues to be moderate and he has been taking ibuprofen and hydrocodone for pain.  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  He does have to wear steel toed boots and stand for 8 hours a day.   Surgical History:   None  PMH/PSH/Family History/Social History/Meds/Allergies:    Past Medical History:  Diagnosis Date  . Asthma    past hx - not current   . Diverticulosis    sigmoid colon and descending colon  . GERD (gastroesophageal reflux disease)   . Hyperlipidemia    no medications   . Obesity (BMI 35.0-39.9 without comorbidity)    Past Surgical History:  Procedure Laterality Date  . COLONOSCOPY  05/20/2019   Social History   Socioeconomic History  . Marital status: Single    Spouse name: Not on file  . Number of children: Not on file  . Years of education: Not on file  . Highest education level: Not on file  Occupational History  . Not on file  Tobacco Use  . Smoking status: Former  . Smokeless tobacco: Never  Vaping Use  . Vaping status: Never Used  Substance and  Sexual Activity  . Alcohol use: Yes    Comment: rarely  . Drug use: No  . Sexual activity: Yes  Other Topics Concern  . Not on file  Social History Narrative  . Not on file   Social Determinants of Health   Financial Resource Strain: Not on file  Food Insecurity: Not on file  Transportation Needs: Not on file  Physical Activity: Not on file  Stress: Not on file  Social Connections: Unknown (06/25/2021)   Received [facility], [facility]   Social Network   . Social Network: Not on file   Family History  Problem Relation Age of Onset  . Hypertension Mother   . Diabetes Mother   . Hypertension Father   . Asthma Father   . Diabetes Maternal Grandmother   . Colon cancer Neg Hx   . Colon polyps Neg Hx   . Esophageal cancer Neg Hx   . Rectal cancer Neg Hx   . Stomach cancer Neg Hx    Allergies  Allergen Reactions  . Shellfish Allergy Anaphylaxis, Itching and Swelling  . Catfish [Fish Allergy] Itching and Other (  See Comments)    Mainly focused on throat and inside mouth, itching   . Other     [place] nuts   Current Outpatient Medications  Medication Sig Dispense Refill  . albuterol (VENTOLIN HFA) 108 (90 Base) MCG/ACT inhaler Inhale 2 puffs into the lungs every 6 (six) hours as needed for wheezing or shortness of breath. 8 g 2  . amLODipine (NORVASC) 5 MG tablet Take 1 tablet (5 mg total) by mouth daily. 90 tablet 1  . amoxicillin-clavulanate (AUGMENTIN) 875-125 MG tablet Take 1 tablet by mouth 2 (two) times daily. 20 tablet 0  . BLACK CURRANT SEED OIL PO Take by mouth daily.    . cyanocobalamin (VITAMIN B12) 1000 MCG/ML injection Inject 1ml in deltoid once weekly for 4 weeks, then inject 1 ml once a month thereafter 6 mL 1  . Multiple Vitamin (MULTIVITAMIN) tablet Take 1 tablet by mouth daily.    . naproxen (NAPROSYN) 500 MG tablet Take 1 tablet (500 mg total) by mouth 2 (two) times daily with a meal. 30 tablet 0  . nitroGLYCERIN (NITRODUR - DOSED IN MG/24 HR)  0.2 mg/hr patch Place 1 patch (0.2 mg total) onto the skin daily. Place 1/4 of the patch over the affected area. 30 patch 1  . nystatin (MYCOSTATIN/NYSTOP) powder Apply 1 application topically 3 (three) times daily. 15 g 1  . SYRINGE-NEEDLE, DISP, 3 ML (BD SAFETYGLIDE SYRINGE/NEEDLE) 25G X 1" 3 ML MISC Use for B12 injections 100 each 11   No current facility-administered medications for this visit.   No results found.  Review of Systems:   A ROS was performed including pertinent positives and negatives as documented in the HPI.  Physical Exam :   Constitutional: NAD and appears stated age Neurological: Alert and oriented Psych: Appropriate affect and cooperative There were no vitals taken for this visit.   Comprehensive Musculoskeletal Exam:    Mild tenderness with palpation over the distal fifth metatarsal of the left foot.  No evidence of erythema, edema, or ecchymosis.  No tenderness throughout the rest of the left foot.  Full active ankle ROM.  DP pulse 2+.  Neurosensory exam to the left foot intact.  Imaging:     Assessment:   52 y.o. male   with lateral left foot pain after an injury 1.5 weeks ago.  Radiographs reviewed today from both urgent care and family medicine do not appear to show compelling evidence for fracture.  Given this I do believe he sustained a likely contusion as pain is directly over the metatarsal.  Unable to rule out sprain however this will not alter treatment plan.  I would like for him to begin weaning out of the boot back to a normal shoe as tolerated.  I did recommend using a postop shoe to transition between these however he did not tolerate this well so we will try the short boot.  Will plan to have him remain light duty for another 1 to 2 weeks and return to full duty at that time.  Can follow-up as needed.  Plan :    -Can return to work full duty without restriction -Return to clinic as needed     I personally saw and evaluated the patient, and  participated in the management and treatment plan.  Hazle Nordmann, PA-C Orthopedics

## 2022-12-12 NOTE — Progress Notes (Signed)
Chief Complaint: Left foot pain     History of Present Illness:   12/12/22: Patient presents today for follow-up of his left foot.  States that he is doing much better since last visit.  He has been able to transition out of the boot back into normal shoes.  He is still performing light duty at his job however last week he did begin wearing his work boots.  Pain levels have improved as he describes current symptoms as mild soreness.  He is taking ibuprofen as needed.   11/25/22: Jacob Williamson is a 52 y.o. male presenting today for evaluation of lateral left foot pain.  He injured his foot about a week and a half ago when he states that he had been drinking and accidentally hit his foot against a bed frame.  He was seen in urgent care and was told that he has a possible stress fracture.  He was placed in a tall walking boot.  Pain continues to be moderate and he has been taking ibuprofen and hydrocodone for pain.  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  He does have to wear steel toed boots and stand for 8 hours a day.   Surgical History:   None  PMH/PSH/Family History/Social History/Meds/Allergies:    Past Medical History:  Diagnosis Date   Asthma    past hx - not current    Diverticulosis    sigmoid colon and descending colon   GERD (gastroesophageal reflux disease)    Hyperlipidemia    no medications    Obesity (BMI 35.0-39.9 without comorbidity)    Past Surgical History:  Procedure Laterality Date   COLONOSCOPY  05/20/2019   Social History   Socioeconomic History   Marital status: Single    Spouse name: Not on file   Number of children: Not on file   Years of education: Not on file   Highest education level: Not on file  Occupational History   Not on file  Tobacco Use   Smoking status: Former   Smokeless tobacco: Never  Vaping Use   Vaping status: Never Used  Substance and Sexual Activity    Alcohol use: Yes    Comment: rarely   Drug use: No   Sexual activity: Yes  Other Topics Concern   Not on file  Social History Narrative   Not on file   Social Determinants of Health   Financial Resource Strain: Not on file  Food Insecurity: Not on file  Transportation Needs: Not on file  Physical Activity: Not on file  Stress: Not on file  Social Connections: Unknown (06/25/2021)   Received from [facility], [facility]   Social Network    Social Network: Not on file   Family History  Problem Relation Age of Onset   Hypertension Mother    Diabetes Mother    Hypertension Father    Asthma Father    Diabetes Maternal Grandmother    Colon cancer Neg Hx    Colon polyps Neg Hx    Esophageal cancer Neg Hx    Rectal cancer Neg Hx    Stomach cancer Neg Hx    Allergies  Allergen Reactions   Shellfish Allergy Anaphylaxis, Itching and Swelling   Catfish [Fish Allergy] Itching and Other (  See Comments)    Mainly focused on throat and inside mouth, itching    Other     [place] nuts   Current Outpatient Medications  Medication Sig Dispense Refill   albuterol (VENTOLIN HFA) 108 (90 Base) MCG/ACT inhaler Inhale 2 puffs into the lungs every 6 (six) hours as needed for wheezing or shortness of breath. 8 g 2   amLODipine (NORVASC) 5 MG tablet Take 1 tablet (5 mg total) by mouth daily. 90 tablet 1   amoxicillin-clavulanate (AUGMENTIN) 875-125 MG tablet Take 1 tablet by mouth 2 (two) times daily. 20 tablet 0   BLACK CURRANT SEED OIL PO Take by mouth daily.     cyanocobalamin (VITAMIN B12) 1000 MCG/ML injection Inject 1ml in deltoid once weekly for 4 weeks, then inject 1 ml once a month thereafter 6 mL 1   Multiple Vitamin (MULTIVITAMIN) tablet Take 1 tablet by mouth daily.     naproxen (NAPROSYN) 500 MG tablet Take 1 tablet (500 mg total) by mouth 2 (two) times daily with a meal. 30 tablet 0   nitroGLYCERIN (NITRODUR - DOSED IN MG/24 HR) 0.2 mg/hr patch Place 1 patch (0.2 mg  total) onto the skin daily. Place 1/4 of the patch over the affected area. 30 patch 1   nystatin (MYCOSTATIN/NYSTOP) powder Apply 1 application topically 3 (three) times daily. 15 g 1   SYRINGE-NEEDLE, DISP, 3 ML (BD SAFETYGLIDE SYRINGE/NEEDLE) 25G X 1" 3 ML MISC Use for B12 injections 100 each 11   No current facility-administered medications for this visit.   No results found.  Review of Systems:   A ROS was performed including pertinent positives and negatives as documented in the HPI.  Physical Exam :   Constitutional: NAD and appears stated age Neurological: Alert and oriented Psych: Appropriate affect and cooperative There were no vitals taken for this visit.   Comprehensive Musculoskeletal Exam:    Mild tenderness with palpation over the distal fifth metatarsal of the left foot.  No evidence of erythema, edema, or ecchymosis.  No tenderness throughout the rest of the left foot.  Full active ankle ROM.  DP pulse 2+.  Neurosensory exam to the left foot intact.  Imaging:     Assessment:   52 y.o. male now 4 weeks status post injury to his left foot consistent with a contusion.  He has been able to wean out of the walking boot back into normal shoes and work boots with only mild discomfort.  Patient does wish to transition from light duty to full duty at his job given this progression.  I believe this is very reasonable and will provide a work note today to be released to full duty.  Patient will let me know if he has any issues with his transition and can return to clinic as needed.  Plan :    -Can return to work full duty without restriction -Return to clinic as needed     I personally saw and evaluated the patient, and participated in the management and treatment plan.  Hazle Nordmann, PA-C Orthopedics

## 2022-12-13 ENCOUNTER — Ambulatory Visit (HOSPITAL_BASED_OUTPATIENT_CLINIC_OR_DEPARTMENT_OTHER): Payer: BC Managed Care – PPO | Admitting: Student

## 2023-01-04 ENCOUNTER — Ambulatory Visit: Payer: BC Managed Care – PPO | Admitting: Internal Medicine

## 2023-01-04 ENCOUNTER — Encounter: Payer: Self-pay | Admitting: Internal Medicine

## 2023-01-04 ENCOUNTER — Ambulatory Visit (INDEPENDENT_AMBULATORY_CARE_PROVIDER_SITE_OTHER): Payer: BC Managed Care – PPO

## 2023-01-04 VITALS — BP 124/84 | HR 75 | Temp 98.4°F | Wt 240.2 lb

## 2023-01-04 DIAGNOSIS — R0601 Orthopnea: Secondary | ICD-10-CM

## 2023-01-04 DIAGNOSIS — R0609 Other forms of dyspnea: Secondary | ICD-10-CM

## 2023-01-04 NOTE — Progress Notes (Signed)
Established Patient Office Visit     CC/Reason for Visit: Discuss acute concerns  HPI: Jacob Williamson is a 52 y.o. male who is coming in today for the above mentioned reasons. Past Medical History is significant for: Vitamin D and B12 deficiencies, hyperlipidemia, impaired glucose tolerance, obesity.  He states that over the past couple months he has had a picture of progressive "wheezing" at nighttime.  He says he wakes up pretty consistently at around 3 in the morning and it feels like he has fluid in his lungs.  He has had to add pillows to his bed.  In addition he describes shortness of breath with exertion.  He denies chest pain although he does admit to some chest tightness during his "wheezing episodes".  His family history significant for father who died from a heart attack at age 48.  He believes this is asthma as many family members including dad, several siblings and cousins have it.  No dizziness or lightheadedness.  He has been using an albuterol inhaler almost every day.  He states that these nighttime episodes happen almost every night.  In the daytime episodes of dyspnea on exertion are increasing in frequency.   Past Medical/Surgical History: Past Medical History:  Diagnosis Date   Asthma    past hx - not current    Diverticulosis    sigmoid colon and descending colon   GERD (gastroesophageal reflux disease)    Hyperlipidemia    no medications    Obesity (BMI 35.0-39.9 without comorbidity)     Past Surgical History:  Procedure Laterality Date   COLONOSCOPY  05/20/2019    Social History:  reports that he has quit smoking. He has never used smokeless tobacco. He reports current alcohol use. He reports that he does not use drugs.  Allergies: Allergies  Allergen Reactions   Shellfish Allergy Anaphylaxis, Itching and Swelling   Catfish [Fish Allergy] Itching and Other (See Comments)    Mainly focused on throat and inside mouth, itching    Other      [place] nuts    Family History:  Family History  Problem Relation Age of Onset   Hypertension Mother    Diabetes Mother    Hypertension Father    Asthma Father    Diabetes Maternal Grandmother    Colon cancer Neg Hx    Colon polyps Neg Hx    Esophageal cancer Neg Hx    Rectal cancer Neg Hx    Stomach cancer Neg Hx      Current Outpatient Medications:    albuterol (VENTOLIN HFA) 108 (90 Base) MCG/ACT inhaler, Inhale 2 puffs into the lungs every 6 (six) hours as needed for wheezing or shortness of breath., Disp: 8 g, Rfl: 2   amLODipine (NORVASC) 5 MG tablet, Take 1 tablet (5 mg total) by mouth daily., Disp: 90 tablet, Rfl: 1   BLACK CURRANT SEED OIL PO, Take by mouth daily., Disp: , Rfl:    cyanocobalamin (VITAMIN B12) 1000 MCG/ML injection, Inject 1ml in deltoid once weekly for 4 weeks, then inject 1 ml once a month thereafter, Disp: 6 mL, Rfl: 1   Multiple Vitamin (MULTIVITAMIN) tablet, Take 1 tablet by mouth daily., Disp: , Rfl:    naproxen (NAPROSYN) 500 MG tablet, Take 1 tablet (500 mg total) by mouth 2 (two) times daily with a meal., Disp: 30 tablet, Rfl: 0   nitroGLYCERIN (NITRODUR - DOSED IN MG/24 HR) 0.2 mg/hr patch, Place 1 patch (0.2 mg total)  onto the skin daily. Place 1/4 of the patch over the affected area., Disp: 30 patch, Rfl: 1   nystatin (MYCOSTATIN/NYSTOP) powder, Apply 1 application topically 3 (three) times daily., Disp: 15 g, Rfl: 1   SYRINGE-NEEDLE, DISP, 3 ML (BD SAFETYGLIDE SYRINGE/NEEDLE) 25G X 1" 3 ML MISC, Use for B12 injections, Disp: 100 each, Rfl: 11  Review of Systems:  Negative unless indicated in HPI.   Physical Exam: Vitals:   01/04/23 1342  BP: 124/84  Pulse: 75  Temp: 98.4 F (36.9 C)  TempSrc: Oral  SpO2: 96%  Weight: 240 lb 3.2 oz (109 kg)    Body mass index is 35.47 kg/m.   Physical Exam Vitals reviewed.  Constitutional:      Appearance: Normal appearance.  HENT:     Head: Normocephalic and atraumatic.  Eyes:      Conjunctiva/sclera: Conjunctivae normal.     Pupils: Pupils are equal, round, and reactive to light.  Cardiovascular:     Rate and Rhythm: Normal rate and regular rhythm.  Pulmonary:     Effort: Pulmonary effort is normal.     Breath sounds: Normal breath sounds.  Skin:    General: Skin is warm and dry.  Neurological:     General: No focal deficit present.     Mental Status: He is alert and oriented to person, place, and time.  Psychiatric:        Mood and Affect: Mood normal.        Behavior: Behavior normal.        Thought Content: Thought content normal.        Judgment: Judgment normal.     Impression and Plan:  DOE (dyspnea on exertion) -     ECHOCARDIOGRAM COMPLETE; Future -     Ambulatory referral to Cardiology -     DG Chest 2 View; Future -     EKG 12-Lead  Orthopnea -     ECHOCARDIOGRAM COMPLETE; Future -     Ambulatory referral to Cardiology -     DG Chest 2 View; Future -     EKG 12-Lead   -EKG done in office and interpreted by myself as normal sinus rhythm with a rate of 76, flattened T waves diffusely. -Mostly concerned about cardiac etiology, with orthopnea wonder about ejection fraction.  Will send for stat echo and chest x-ray.  Urgent cardiology referral has been placed. -If cardiac etiology is ruled out, can consider referral to pulmonary for PFTs and asthma diagnosis.  Time spent:34 minutes reviewing chart, interviewing and examining patient and formulating plan of care.     Chaya Jan, MD [facility]

## 2023-01-29 DIAGNOSIS — J069 Acute upper respiratory infection, unspecified: Secondary | ICD-10-CM | POA: Diagnosis not present

## 2023-01-29 DIAGNOSIS — J4541 Moderate persistent asthma with (acute) exacerbation: Secondary | ICD-10-CM | POA: Diagnosis not present

## 2023-01-29 DIAGNOSIS — Z20822 Contact with and (suspected) exposure to covid-19: Secondary | ICD-10-CM | POA: Diagnosis not present

## 2023-02-16 ENCOUNTER — Ambulatory Visit (INDEPENDENT_AMBULATORY_CARE_PROVIDER_SITE_OTHER): Payer: BC Managed Care – PPO | Admitting: Internal Medicine

## 2023-02-16 ENCOUNTER — Encounter: Payer: Self-pay | Admitting: Internal Medicine

## 2023-02-16 ENCOUNTER — Telehealth: Payer: Self-pay | Admitting: *Deleted

## 2023-02-16 VITALS — BP 138/82 | HR 86 | Temp 97.9°F | Ht 68.5 in | Wt 236.8 lb

## 2023-02-16 DIAGNOSIS — R7302 Impaired glucose tolerance (oral): Secondary | ICD-10-CM

## 2023-02-16 DIAGNOSIS — E538 Deficiency of other specified B group vitamins: Secondary | ICD-10-CM

## 2023-02-16 DIAGNOSIS — I10 Essential (primary) hypertension: Secondary | ICD-10-CM | POA: Insufficient documentation

## 2023-02-16 DIAGNOSIS — E559 Vitamin D deficiency, unspecified: Secondary | ICD-10-CM | POA: Diagnosis not present

## 2023-02-16 DIAGNOSIS — E785 Hyperlipidemia, unspecified: Secondary | ICD-10-CM | POA: Diagnosis not present

## 2023-02-16 DIAGNOSIS — K219 Gastro-esophageal reflux disease without esophagitis: Secondary | ICD-10-CM

## 2023-02-16 DIAGNOSIS — Z0000 Encounter for general adult medical examination without abnormal findings: Secondary | ICD-10-CM

## 2023-02-16 DIAGNOSIS — E669 Obesity, unspecified: Secondary | ICD-10-CM | POA: Diagnosis not present

## 2023-02-16 LAB — CBC WITH DIFFERENTIAL/PLATELET
Basophils Absolute: 0 10*3/uL (ref 0.0–0.1)
Basophils Relative: 0.3 % (ref 0.0–3.0)
Eosinophils Absolute: 0.1 10*3/uL (ref 0.0–0.7)
Eosinophils Relative: 2.5 % (ref 0.0–5.0)
HCT: 43.5 % (ref 39.0–52.0)
Hemoglobin: 14.1 g/dL (ref 13.0–17.0)
Lymphocytes Relative: 43.4 % (ref 12.0–46.0)
Lymphs Abs: 2.6 10*3/uL (ref 0.7–4.0)
MCHC: 32.3 g/dL (ref 30.0–36.0)
MCV: 83 fL (ref 78.0–100.0)
Monocytes Absolute: 0.4 10*3/uL (ref 0.1–1.0)
Monocytes Relative: 6.1 % (ref 3.0–12.0)
Neutro Abs: 2.8 10*3/uL (ref 1.4–7.7)
Neutrophils Relative %: 47.7 % (ref 43.0–77.0)
Platelets: 264 10*3/uL (ref 150.0–400.0)
RBC: 5.25 Mil/uL (ref 4.22–5.81)
RDW: 14.7 % (ref 11.5–15.5)
WBC: 5.9 10*3/uL (ref 4.0–10.5)

## 2023-02-16 LAB — TSH: TSH: 1 u[IU]/mL (ref 0.35–5.50)

## 2023-02-16 LAB — COMPREHENSIVE METABOLIC PANEL
ALT: 18 U/L (ref 0–53)
AST: 14 U/L (ref 0–37)
Albumin: 4.1 g/dL (ref 3.5–5.2)
Alkaline Phosphatase: 75 U/L (ref 39–117)
BUN: 7 mg/dL (ref 6–23)
CO2: 27 meq/L (ref 19–32)
Calcium: 9 mg/dL (ref 8.4–10.5)
Chloride: 105 meq/L (ref 96–112)
Creatinine, Ser: 0.94 mg/dL (ref 0.40–1.50)
GFR: 94.56 mL/min (ref >60.00)
Glucose, Bld: 98 mg/dL (ref 70–99)
Potassium: 4.7 meq/L (ref 3.5–5.1)
Sodium: 140 meq/L (ref 135–145)
Total Bilirubin: 0.4 mg/dL (ref 0.2–1.2)
Total Protein: 6.6 g/dL (ref 6.0–8.3)

## 2023-02-16 LAB — LIPID PANEL
Cholesterol: 182 mg/dL (ref 0–200)
HDL: 37.9 mg/dL — ABNORMAL LOW (ref >39.00)
LDL Cholesterol: 122 mg/dL — ABNORMAL HIGH (ref 0–99)
NonHDL: 143.65
Total CHOL/HDL Ratio: 5
Triglycerides: 110 mg/dL (ref 0.0–149.0)
VLDL: 22 mg/dL (ref 0.0–40.0)

## 2023-02-16 LAB — HEMOGLOBIN A1C: Hgb A1c MFr Bld: 6.4 % (ref 4.6–6.5)

## 2023-02-16 LAB — VITAMIN D 25 HYDROXY (VIT D DEFICIENCY, FRACTURES): VITD: <7 ng/mL — ABNORMAL LOW (ref 30.00–100.00)

## 2023-02-16 LAB — PSA: PSA: 1.16 ng/mL (ref 0.10–4.00)

## 2023-02-16 LAB — VITAMIN B12: Vitamin B-12: 284 pg/mL (ref 211–911)

## 2023-02-16 NOTE — Telephone Encounter (Signed)
 Copied from CRM [id number]. Topic: General - Other >> Feb 16, 2023  1:31 PM Melissa C wrote: Reason for CRM: patient returning Norma's call, asked that she call him back

## 2023-02-16 NOTE — Progress Notes (Signed)
 Established Patient Office Visit     CC/Reason for Visit: Annual preventive exam  HPI: Jacob Williamson is a 53 y.o. male who is coming in today for the above mentioned reasons. Past Medical History is significant for: Hyperlipidemia, impaired glucose tolerance, hypertension, obesity, vitamin D  and B12 deficiencies.  He has been working on lifestyle changes and decided to discontinue amlodipine .  He is due for flu, COVID, shingles vaccinations.  He had a colonoscopy in 2021, is due for prostate cancer screening.   Past Medical/Surgical History: Past Medical History:  Diagnosis Date   Asthma    past hx - not current    Diverticulosis    sigmoid colon and descending colon   GERD (gastroesophageal reflux disease)    Hyperlipidemia    no medications    Obesity (BMI 35.0-39.9 without comorbidity)     Past Surgical History:  Procedure Laterality Date   COLONOSCOPY  05/20/2019    Social History:  reports that he has quit smoking. He has never used smokeless tobacco. He reports current alcohol use. He reports that he does not use drugs.  Allergies: Allergies  Allergen Reactions   Shellfish Allergy Anaphylaxis, Itching and Swelling   Catfish [Fish Allergy] Itching and Other (See Comments)    Mainly focused on throat and inside mouth, itching    Other     Brazilian nuts    Family History:  Family History  Problem Relation Age of Onset   Hypertension Mother    Diabetes Mother    Hypertension Father    Asthma Father    Diabetes Maternal Grandmother    Colon cancer Neg Hx    Colon polyps Neg Hx    Esophageal cancer Neg Hx    Rectal cancer Neg Hx    Stomach cancer Neg Hx      Current Outpatient Medications:    albuterol  (VENTOLIN  HFA) 108 (90 Base) MCG/ACT inhaler, Inhale 2 puffs into the lungs every 6 (six) hours as needed for wheezing or shortness of breath., Disp: 8 g, Rfl: 2   BLACK CURRANT SEED OIL PO, Take by mouth daily., Disp: , Rfl:    cyanocobalamin   (VITAMIN B12) 1000 MCG/ML injection, Inject 1ml in deltoid once weekly for 4 weeks, then inject 1 ml once a month thereafter, Disp: 6 mL, Rfl: 1   Multiple Vitamin (MULTIVITAMIN) tablet, Take 1 tablet by mouth daily., Disp: , Rfl:    SYRINGE-NEEDLE, DISP, 3 ML (BD SAFETYGLIDE SYRINGE/NEEDLE) 25G X 1 3 ML MISC, Use for B12 injections, Disp: 100 each, Rfl: 11   amLODipine  (NORVASC ) 5 MG tablet, Take 1 tablet (5 mg total) by mouth daily. (Patient not taking: Reported on 02/16/2023), Disp: 90 tablet, Rfl: 1   nitroGLYCERIN  (NITRODUR - DOSED IN MG/24 HR) 0.2 mg/hr patch, Place 1 patch (0.2 mg total) onto the skin daily. Place 1/4 of the patch over the affected area., Disp: 30 patch, Rfl: 1  Review of Systems:  Negative unless indicated in HPI.   Physical Exam: Vitals:   02/16/23 0811 02/16/23 0834  BP: (!) 140/78 138/82  Pulse: 86   Temp: 97.9 F (36.6 C)   TempSrc: Oral   SpO2: 97%   Weight: 236 lb 12.8 oz (107.4 kg)   Height: 5' 8.5 (1.74 m)     Body mass index is 35.48 kg/m.   Physical Exam Vitals reviewed.  Constitutional:      Appearance: Normal appearance.  HENT:     Head: Normocephalic and atraumatic.  Eyes:     Conjunctiva/sclera: Conjunctivae normal.     Pupils: Pupils are equal, round, and reactive to light.  Cardiovascular:     Rate and Rhythm: Normal rate and regular rhythm.  Pulmonary:     Effort: Pulmonary effort is normal.     Breath sounds: Normal breath sounds.  Skin:    General: Skin is warm and dry.  Neurological:     General: No focal deficit present.     Mental Status: He is alert and oriented to person, place, and time.  Psychiatric:        Mood and Affect: Mood normal.        Behavior: Behavior normal.        Thought Content: Thought content normal.        Judgment: Judgment normal.     Flowsheet Row Office Visit from 02/16/2023 in [facility] [place]  PHQ-9 Total Score 0        Impression and Plan:  Well adult  exam -     PSA; Future  Vitamin D  deficiency -     VITAMIN D  25 Hydroxy (Vit-D Deficiency, Fractures); Future  Primary hypertension -     CBC with Differential/Platelet; Future -     Comprehensive metabolic panel; Future  Vitamin B12 deficiency -     Vitamin B12; Future  Hyperlipidemia, unspecified hyperlipidemia type -     Lipid panel; Future  IGT (impaired glucose tolerance) -     Hemoglobin A1c; Future  Obesity (BMI 35.0-39.9 without comorbidity) -     TSH; Future  Gastroesophageal reflux disease without esophagitis   -Recommend routine eye and dental care. -Healthy lifestyle discussed in detail. -Labs to be updated today. -Prostate cancer screening: PSA today Health Maintenance  Topic Date Due   COVID-19 Vaccine (1 - 2024-25 season) 03/04/2023*   Flu Shot  05/15/2023*   Zoster (Shingles) Vaccine (1 of 2) 05/17/2023*   DTaP/Tdap/Td vaccine (2 - Td or Tdap) 05/05/2029   Colon Cancer Screening  05/19/2029   Hepatitis C Screening  Completed   HIV Screening  Completed   HPV Vaccine  Aged Out  *Topic was postponed. The date shown is not the original due date.    -Declines flu, COVID, shingles vaccinations despite counseling. -Advised that he resume amlodipine  for high blood pressure.    Tully Theophilus Andrews, MD [facility]

## 2023-02-20 ENCOUNTER — Other Ambulatory Visit: Payer: Self-pay | Admitting: Internal Medicine

## 2023-02-20 DIAGNOSIS — E782 Mixed hyperlipidemia: Secondary | ICD-10-CM

## 2023-02-20 DIAGNOSIS — E559 Vitamin D deficiency, unspecified: Secondary | ICD-10-CM

## 2023-02-20 DIAGNOSIS — R7302 Impaired glucose tolerance (oral): Secondary | ICD-10-CM

## 2023-02-20 MED ORDER — VITAMIN D (ERGOCALCIFEROL) 1.25 MG (50000 UNIT) PO CAPS: 50000.0000 [IU] | ORAL_CAPSULE | ORAL | 0 refills | Status: AC

## 2023-02-27 ENCOUNTER — Telehealth: Payer: Self-pay

## 2023-02-27 NOTE — Telephone Encounter (Signed)
Attempted to call the patient.

## 2023-02-27 NOTE — Telephone Encounter (Signed)
 Copied from CRM [id number]. Topic: Clinical - Medical Advice >> Feb 27, 2023  3:54 PM Elizebeth Brooking wrote: Reason for CRM: Patient called in regarding returning a callback, he stated if he could please be called back on him job number at [phone]

## 2023-03-01 NOTE — Telephone Encounter (Signed)
Spoke to patient and reviewed lab results. 

## 2023-03-29 ENCOUNTER — Encounter (HOSPITAL_BASED_OUTPATIENT_CLINIC_OR_DEPARTMENT_OTHER): Payer: Self-pay | Admitting: Cardiovascular Disease

## 2023-04-18 ENCOUNTER — Encounter: Payer: Self-pay | Admitting: Cardiology

## 2023-04-18 ENCOUNTER — Ambulatory Visit: Payer: BC Managed Care – PPO | Attending: Cardiology | Admitting: Cardiology

## 2023-04-18 VITALS — BP 136/74 | HR 78 | Resp 16 | Ht 68.0 in | Wt 247.0 lb

## 2023-04-18 DIAGNOSIS — Z8249 Family history of ischemic heart disease and other diseases of the circulatory system: Secondary | ICD-10-CM | POA: Diagnosis not present

## 2023-04-18 DIAGNOSIS — R0609 Other forms of dyspnea: Secondary | ICD-10-CM

## 2023-04-18 DIAGNOSIS — E7800 Pure hypercholesterolemia, unspecified: Secondary | ICD-10-CM

## 2023-04-18 DIAGNOSIS — I10 Essential (primary) hypertension: Secondary | ICD-10-CM | POA: Diagnosis not present

## 2023-04-18 NOTE — Progress Notes (Signed)
 Cardiology Office Note:  .   Date:  04/18/2023  ID:  Jacob Williamson, DOB Aug 09, 1970, MRN [id number] PCP: Philip Aspen, Limmie Patricia, MD  [facility] Providers Cardiologist:  Yates Decamp, MD   History of Present Illness: Marland Kitchen   Jacob Williamson is a 53 y.o. African-American male patient with primary hypertension, hypercholesterolemia, prediabetes mellitus, moderate obesity, family history of premature coronary disease in which his father had MI at age 65 years, referred to me for evaluation of dyspnea on exertion and abnormal EKG.  Discussed the use of AI scribe software for clinical note transcription with the patient, who gave verbal consent to proceed.  History of Present Illness   Jacob Williamson, a 53 year old with a family history of heart disease, presents with concerns about his health. His father passed away from a heart attack at the age of 29, which has prompted Tin to seek medical advice. He reports having high blood pressure, high cholesterol, and being overweight. He also mentions that he is pre-diabetic. He is currently trying to manage these conditions through lifestyle modifications, including changes to his diet and physical activity. He works in a physically demanding job at a [employer]process [employer], which he believes contributes to his overall physical activity. However, he acknowledges that he struggles with weight loss. He smokes marijuana and drinks alcohol like a glass of wine daily or every other day. He is motivated to improve his health and is open to taking medication if necessary.      Labs   Lab Results  Component Value Date   CHOL 182 02/16/2023   HDL 37.90 (L) 02/16/2023   LDLCALC 122 (H) 02/16/2023   TRIG 110.0 02/16/2023   CHOLHDL 5 02/16/2023   Lab Results  Component Value Date   NA 140 02/16/2023   K 4.7 02/16/2023   CO2 27 02/16/2023   GLUCOSE 98 02/16/2023   BUN 7 02/16/2023   CREATININE 0.94 02/16/2023   CALCIUM 9.0 02/16/2023   GFR 94.56  02/16/2023   GFRNONAA >60 07/01/2020      Latest Ref Rng & Units 02/16/2023    8:48 AM 11/23/2021    7:33 AM 07/01/2020   12:12 PM  BMP  Glucose 70 - 99 mg/dL 98  409  95   BUN 6 - 23 mg/dL 7  17  12    Creatinine 0.40 - 1.50 mg/dL 8.11  9.14  7.82   Sodium 135 - 145 mEq/L 140  138  137   Potassium 3.5 - 5.1 mEq/L 4.7  4.7  3.9   Chloride 96 - 112 mEq/L 105  102  102   CO2 19 - 32 mEq/L 27  29  25    Calcium 8.4 - 10.5 mg/dL 9.0  9.9  8.9       Latest Ref Rng & Units 02/16/2023    8:48 AM 11/23/2021    7:33 AM 07/01/2020   12:12 PM  CBC  WBC 4.0 - 10.5 K/uL 5.9  10.9  5.4   Hemoglobin 13.0 - 17.0 g/dL 95.6  21.3  08.6   Hematocrit 39.0 - 52.0 % 43.5  45.5  48.2   Platelets 150.0 - 400.0 K/uL 264.0  251.0  209    Lab Results  Component Value Date   HGBA1C 6.4 02/16/2023    Lab Results  Component Value Date   TSH 1.00 02/16/2023    Review of Systems  Cardiovascular:  Negative for chest pain, dyspnea on exertion and leg swelling.   Physical Exam:  VS:  BP 136/74 (BP Location: Left Arm, Patient Position: Sitting, Cuff Size: Large)   Pulse 78   Resp 16   Ht 5\' 8"  (1.727 m)   Wt 247 lb (112 kg)   SpO2 98%   BMI 37.56 kg/m    Wt Readings from Last 3 Encounters:  04/18/23 247 lb (112 kg)  02/16/23 236 lb 12.8 oz (107.4 kg)  01/04/23 240 lb 3.2 oz (109 kg)    Physical Exam Constitutional:      Appearance: He is obese.  Neck:     Vascular: No carotid bruit or JVD.  Cardiovascular:     Rate and Rhythm: Normal rate and regular rhythm.     Pulses: Intact distal pulses.     Heart sounds: Normal heart sounds. No murmur heard.    No gallop.  Pulmonary:     Effort: Pulmonary effort is normal.     Breath sounds: Normal breath sounds.  Abdominal:     General: Bowel sounds are normal.     Palpations: Abdomen is soft.  Musculoskeletal:     Right lower leg: No edema.     Left lower leg: No edema.    Studies Reviewed: Marland Kitchen     EKG:    EKG  Interpretation Date/Time:  Tuesday April 18 2023 08:55:12 EST Ventricular Rate:  82 PR Interval:  140 QRS Duration:  86 QT Interval:  292 QTC Calculation: 341 R Axis:   -12  Text Interpretation: EKG 04/18/2023: Normal sinus rhythm at rate of 82 beats minute, normal axis, nonspecific inferolateral T wave flattening. Confirmed by Delrae Rend [phone]) on 04/18/2023 9:28:03 AM    EKG 01/04/2023: Normal sinus rhythm at rate of 76 bpm, left pedal enlargement, nonspecific T wave flattening inferolateral leads.  Medications and allergies    Allergies  Allergen Reactions   Shellfish Allergy Anaphylaxis, Itching and Swelling   Catfish [Fish Allergy] Itching and Other (See Comments)    Mainly focused on throat and inside mouth, itching    Other     [place] nuts     Current Outpatient Medications:    albuterol (VENTOLIN HFA) 108 (90 Base) MCG/ACT inhaler, Inhale 2 puffs into the lungs every 6 (six) hours as needed for wheezing or shortness of breath., Disp: 8 g, Rfl: 2   amLODipine (NORVASC) 5 MG tablet, Take 1 tablet (5 mg total) by mouth daily., Disp: 90 tablet, Rfl: 1   BLACK CURRANT SEED OIL PO, Take by mouth daily., Disp: , Rfl:    cyanocobalamin (VITAMIN B12) 1000 MCG/ML injection, Inject 1ml in deltoid once weekly for 4 weeks, then inject 1 ml once a month thereafter, Disp: 6 mL, Rfl: 1   Multiple Vitamin (MULTIVITAMIN) tablet, Take 1 tablet by mouth daily., Disp: , Rfl:    nitroGLYCERIN (NITRODUR - DOSED IN MG/24 HR) 0.2 mg/hr patch, Place 1 patch (0.2 mg total) onto the skin daily. Place 1/4 of the patch over the affected area., Disp: 30 patch, Rfl: 1   SYRINGE-NEEDLE, DISP, 3 ML (BD SAFETYGLIDE SYRINGE/NEEDLE) 25G X 1" 3 ML MISC, Use for B12 injections, Disp: 100 each, Rfl: 11   Vitamin D, Ergocalciferol, (DRISDOL) 1.25 MG (50000 UNIT) CAPS capsule, Take 1 capsule (50,000 Units total) by mouth every 7 (seven) days for 12 doses., Disp: 12 capsule, Rfl: 0   ASSESSMENT AND PLAN: .       ICD-10-CM   1. Dyspnea on exertion  R06.09 EKG 12-Lead    2. Primary hypertension  I10  3. Hypercholesteremia  E78.00     4. Family history of premature CAD: Father with MI at age 54 Y  Z28.49 CT CARDIAC SCORING (SELF PAY ONLY)      Assessment and Plan    Cardiovascular Risk Assessment   He is concerned about cardiovascular health due to a family history of early myocardial infarction in his father. He experiences dyspnea, has mild hypertension, mild hypercholesterolemia, and is overweight. He is prediabetic with an A1c of 6.4. Motivated to improve his lifestyle and reduce medication dependency, he understands the importance of lifestyle modifications in managing cardiovascular risk factors and the role of weight loss in reducing these risks. The concept of a 'thermostat' in the brain that regulates weight and the challenges associated with weight loss were explained.   He is open to medical therapy if necessary but prefers lifestyle changes. Discussed the potential use of weight loss medications like Ozempic, which can aid in weight loss and improve metabolic parameters without significant long-term harm. A coronary calcium score is recommended to assess plaque buildup, which will guide further management decisions.   Plan: Order coronary calcium score, continue amlodipine 5 mg once daily, consider referral to a health and wellness clinic, and discuss potential use of weight loss medications like Ozempic with PCP.  Hypertension   His mild hypertension is well-managed with amlodipine 5 mg once daily. Emphasized the importance of lifestyle modifications. Plan: Continue amlodipine 5 mg once daily.  Mildly abnormal EKG is related to hypertension.  Echocardiogram has been ordered and I will follow-up on this.  Hypercholesterolemia   He has mild hypercholesterolemia. Cholesterol levels are not critically high, but lifestyle changes are important. Previously stopped cholesterol medication  to attempt lifestyle changes. The coronary calcium score will determine the need for resuming medication.  Plan: Order coronary calcium score and consider resuming cholesterol medication if indicated.  Prediabetes   He is prediabetic with an A1c of 6.4. Discussed the importance of lifestyle modifications in managing prediabetes and the potential use of medications for weight loss, which could also aid in managing blood glucose levels. Plan: Consider referral to a health and wellness clinic and discuss potential use of weight loss medications like Ozempic with PCP.  Obesity   He struggles with weight loss and is aware of the challenges associated with it. Explained the physiological mechanisms that make weight loss difficult and emphasized the need for a long-term lifestyle change. He is motivated to lose weight and is considering support from a health and wellness clinic. Discussed the potential use of weight loss medications like Ozempic, which can aid in weight loss and improve metabolic parameters without significant long-term harm. Plan: Consider referral to a health and wellness clinic and discuss potential use of weight loss medications like Ozempic with PCP.           Signed,  Yates Decamp, MD, [facility] 04/18/2023, 9:47 AM [facility] [address] [place], [place] [address] Phone: [phone]. Fax:  [phone]

## 2023-04-18 NOTE — Patient Instructions (Signed)
 Medication Instructions:  Your physician recommends that you continue on your current medications as directed. Please refer to the Current Medication list given to you today.  *If you need a refill on your cardiac medications before your next appointment, please call your pharmacy*  Testing/Procedures: Your physician has requested that you have a coronary calcium score performed. This is not covered by insurance and will be an out-of-pocket cost of approximately $99.   Follow-Up: At [facility], you and your health needs are our priority.  As part of our continuing mission to provide you with exceptional heart care, we have created designated Provider Care Teams.  These Care Teams include your primary Cardiologist (physician) and Advanced Practice Providers (APPs -  Physician Assistants and Nurse Practitioners) who all work together to provide you with the care you need, when you need it.  Your next appointment:   As needed  The format for your next appointment:   In Person  Provider:   Yates Decamp, MD {  Other Instructions   1st Floor: - Lobby - Registration  - Pharmacy  - Lab - Cafe  2nd Floor: - PV Lab - Diagnostic Testing (echo, CT, nuclear med)  3rd Floor: - Vacant  4th Floor: - TCTS (cardiothoracic surgery) - AFib Clinic - Structural Heart Clinic - Vascular Surgery  - Vascular Ultrasound  5th Floor: - HeartCare Cardiology (general and EP) - Clinical Pharmacy for coumadin, hypertension, lipid, weight-loss medications, and med management appointments    Valet parking services will be available as well.

## 2023-04-27 ENCOUNTER — Ambulatory Visit (HOSPITAL_COMMUNITY)
Admission: RE | Admit: 2023-04-27 | Discharge: 2023-04-27 | Disposition: A | Discharge status: Home / Self Care | Source: Ambulatory Visit | Attending: Cardiology | Admitting: Cardiology

## 2023-04-27 DIAGNOSIS — Z8249 Family history of ischemic heart disease and other diseases of the circulatory system: Secondary | ICD-10-CM | POA: Insufficient documentation

## 2023-05-01 ENCOUNTER — Ambulatory Visit: Admitting: Internal Medicine

## 2023-05-01 ENCOUNTER — Encounter: Payer: Self-pay | Admitting: Internal Medicine

## 2023-05-01 ENCOUNTER — Encounter: Payer: Self-pay | Admitting: Cardiology

## 2023-05-01 VITALS — BP 124/80 | HR 105 | Temp 98.1°F | Wt 236.2 lb

## 2023-05-01 DIAGNOSIS — R6889 Other general symptoms and signs: Secondary | ICD-10-CM | POA: Diagnosis not present

## 2023-05-01 DIAGNOSIS — E538 Deficiency of other specified B group vitamins: Secondary | ICD-10-CM | POA: Diagnosis not present

## 2023-05-01 DIAGNOSIS — H8111 Benign paroxysmal vertigo, right ear: Secondary | ICD-10-CM

## 2023-05-01 LAB — POCT INFLUENZA A/B

## 2023-05-01 LAB — POC COVID19 BINAXNOW

## 2023-05-01 MED ORDER — BD SAFETYGLIDE SYRINGE/NEEDLE 25G X 1" 3 ML MISC: 11 refills | Status: AC

## 2023-05-01 MED ORDER — CYANOCOBALAMIN 1000 MCG/ML IJ SOLN
INTRAMUSCULAR | 1 refills | Status: AC
Start: 2023-05-01 — End: ?

## 2023-05-01 NOTE — Progress Notes (Signed)
 His coronary calcium score is fortunately 0, ascending aorta mildly dilated at 4.0 however for his height and weight, probably within normal limits, may consider repeating either an echocardiogram or repeating coronary calcium score again 1 to 2 years to follow-up on ascending aortic dilatation.  Patient wanted to be on lifestyle modification only and did not want to be on a statin, given very minimal elevation in lipids, coronary calcium score of 0, this is fine to continue lifestyle modification for now.

## 2023-05-01 NOTE — Progress Notes (Signed)
 Established Patient Office Visit     CC/Reason for Visit: Vertigo, Flulike symptoms  HPI: Jacob Williamson is a 53 y.o. male who is coming in today for the above mentioned reasons.  On the 13th he had a coronary CT scan.  As he was getting up from the CT scanner he had immediate onset of severe vertigo, nausea and balance disorder.  It is still present today although getting better.  He has been using Dramamine with some relief.  2 days ago he started experiencing extreme fatigue, diaphoresis, head cold symptoms with congestion, runny nose, cough and headache.   Past Medical/Surgical History: Past Medical History:  Diagnosis Date   Asthma    past hx - not current    Diverticulosis    sigmoid colon and descending colon   GERD (gastroesophageal reflux disease)    Hyperlipidemia    no medications    Obesity (BMI 35.0-39.9 without comorbidity)     Past Surgical History:  Procedure Laterality Date   COLONOSCOPY  05/20/2019    Social History:  reports that he has quit smoking. He has never used smokeless tobacco. He reports current alcohol use. He reports that he does not use drugs.  Allergies: Allergies  Allergen Reactions   Shellfish Allergy Anaphylaxis, Itching and Swelling   Catfish [Fish Allergy] Itching and Other (See Comments)    Mainly focused on throat and inside mouth, itching    Other     [place] nuts    Family History:  Family History  Problem Relation Age of Onset   Hypertension Mother    Diabetes Mother    Hypertension Father    Asthma Father    Diabetes Maternal Grandmother    Colon cancer Neg Hx    Colon polyps Neg Hx    Esophageal cancer Neg Hx    Rectal cancer Neg Hx    Stomach cancer Neg Hx      Current Outpatient Medications:    albuterol (VENTOLIN HFA) 108 (90 Base) MCG/ACT inhaler, Inhale 2 puffs into the lungs every 6 (six) hours as needed for wheezing or shortness of breath., Disp: 8 g, Rfl: 2   amLODipine (NORVASC) 5 MG  tablet, Take 1 tablet (5 mg total) by mouth daily., Disp: 90 tablet, Rfl: 1   BLACK CURRANT SEED OIL PO, Take by mouth daily., Disp: , Rfl:    Multiple Vitamin (MULTIVITAMIN) tablet, Take 1 tablet by mouth daily., Disp: , Rfl:    nitroGLYCERIN (NITRODUR - DOSED IN MG/24 HR) 0.2 mg/hr patch, Place 1 patch (0.2 mg total) onto the skin daily. Place 1/4 of the patch over the affected area., Disp: 30 patch, Rfl: 1   cyanocobalamin (VITAMIN B12) 1000 MCG/ML injection, Inject 1ml in deltoid once weekly for 4 weeks, then inject 1 ml once a month thereafter, Disp: 6 mL, Rfl: 1   SYRINGE-NEEDLE, DISP, 3 ML (BD SAFETYGLIDE SYRINGE/NEEDLE) 25G X 1" 3 ML MISC, Use for B12 injections, Disp: 100 each, Rfl: 11   Vitamin D, Ergocalciferol, (DRISDOL) 1.25 MG (50000 UNIT) CAPS capsule, Take 1 capsule (50,000 Units total) by mouth every 7 (seven) days for 12 doses. (Patient not taking: Reported on 05/01/2023), Disp: 12 capsule, Rfl: 0  Review of Systems:  Negative unless indicated in HPI.   Physical Exam: Vitals:   05/01/23 1000  BP: 124/80  Pulse: (!) 105  Temp: 98.1 F (36.7 C)  TempSrc: Oral  SpO2: 96%  Weight: 236 lb 3.2 oz (107.1 kg)  Body mass index is 35.91 kg/m.   Physical Exam Vitals reviewed.  Constitutional:      Appearance: Normal appearance.  HENT:     Right Ear: Tympanic membrane, ear canal and external ear normal.     Left Ear: Tympanic membrane, ear canal and external ear normal.     Mouth/Throat:     Mouth: Mucous membranes are moist.     Pharynx: Oropharynx is clear.  Eyes:     Conjunctiva/sclera: Conjunctivae normal.     Pupils: Pupils are equal, round, and reactive to light.  Cardiovascular:     Rate and Rhythm: Normal rate and regular rhythm.  Pulmonary:     Effort: Pulmonary effort is normal.     Breath sounds: Normal breath sounds.  Neurological:     Mental Status: He is alert.      Impression and Plan:  BPPV (benign paroxysmal positional vertigo), right -      Referral to Neuro Rehab  Flu-like symptoms  Vitamin B12 deficiency -     Cyanocobalamin; Inject 1ml in deltoid once weekly for 4 weeks, then inject 1 ml once a month thereafter  Dispense: 6 mL; Refill: 1 -     BD SafetyGlide Syringe/Needle; Use for B12 injections  Dispense: 100 each; Refill: 11   -B12 injection done in office today. -Suspect he has BPPV that seems isolated to the right side.  Will send referral to vestibular therapy. -Given exam findings, PNA, pharyngitis, ear infection are not likely, hence abx have not been prescribed. -Have advised rest, fluids, OTC antihistamines, cough suppressants and mucinex. -RTC if no improvement in 10-14 days. -In office flu and COVID tests are negative.  Time spent:30 minutes reviewing chart, interviewing and examining patient and formulating plan of care.     Chaya Jan, MD [facility]

## 2023-05-02 ENCOUNTER — Ambulatory Visit: Attending: Internal Medicine | Admitting: Physical Therapy

## 2023-05-02 ENCOUNTER — Other Ambulatory Visit: Payer: Self-pay

## 2023-05-02 ENCOUNTER — Encounter: Payer: Self-pay | Admitting: Physical Therapy

## 2023-05-02 DIAGNOSIS — R42 Dizziness and giddiness: Secondary | ICD-10-CM | POA: Insufficient documentation

## 2023-05-02 DIAGNOSIS — R2681 Unsteadiness on feet: Secondary | ICD-10-CM | POA: Insufficient documentation

## 2023-05-02 NOTE — Therapy (Signed)
 OUTPATIENT PHYSICAL THERAPY VESTIBULAR EVALUATION     Patient Name: Jacob Williamson MRN: [id number] DOB:05/13/1970, 53 y.o., male Today's Date: 05/02/2023  END OF SESSION:  PT End of Session - 05/02/23 1418     Visit Number 1    Number of Visits 8    Date for PT Re-Evaluation 05/26/23    Authorization Type BCBS    PT Start Time 1018    PT Stop Time 1106    PT Time Calculation (min) 48 min    Activity Tolerance Patient limited by pain;Other (comment)   nausea, sweating   Behavior During Therapy [facility] for tasks assessed/performed;Anxious             Past Medical History:  Diagnosis Date   Asthma    past hx - not current    Diverticulosis    sigmoid colon and descending colon   GERD (gastroesophageal reflux disease)    Hyperlipidemia    no medications    Obesity (BMI 35.0-39.9 without comorbidity)    Past Surgical History:  Procedure Laterality Date   COLONOSCOPY  05/20/2019   Patient Active Problem List   Diagnosis Date Noted   Primary hypertension 02/16/2023   Tendinitis of left triceps 05/05/2021   Acute right-sided thoracic back pain 12/19/2019   Vitamin D deficiency 05/07/2019   Vitamin B12 deficiency 05/07/2019   IGT (impaired glucose tolerance) 05/07/2019   Hyperlipidemia 05/07/2019   Obesity (BMI 35.0-39.9 without comorbidity)    GERD (gastroesophageal reflux disease)     PCP: Philip Aspen, Limmie Patricia, MD  REFERRING PROVIDER: Philip Aspen, Limmie Patricia, MD   REFERRING DIAG: H81.11 (ICD-10-CM) - BPPV (benign paroxysmal positional vertigo), right  THERAPY DIAG:  Dizziness and giddiness  Unsteadiness on feet  ONSET DATE: 05/01/2023  Rationale for Evaluation and Treatment: Rehabilitation  SUBJECTIVE:   SUBJECTIVE STATEMENT: Had so much pressure in my head upon coming out of the CT (cardiology) scan (04/27/2023).  Was dizzy and nauseated and have had a lot of coughing and did have a fever.  Dizziness is continuing.  Feel drunk, feel like I  have to move to get my "compass right"; have to move very slowly. Pt accompanied by: self  PERTINENT HISTORY: Jacob Williamson is a 53 y.o. male who is coming in today for the above mentioned reasons.  On the 13th he had a coronary CT scan.  As he was getting up from the CT scanner he had immediate onset of severe vertigo, nausea and balance disorder.  It is still present today although getting better.  He has been using Dramamine with some relief.  2 days ago he started experiencing extreme fatigue, diaphoresis, head cold symptoms with congestion, runny nose, cough and headache.   PMH asthma, GERD, HLD, obesity  PAIN:  Are you having pain? Yes: NPRS scale: 11/10 Pain location: head/chest Pain description: "exploding" pain Aggravating factors: coughing Relieving factors: rest, tylenol  PRECAUTIONS: Fall  RED FLAGS: None   WEIGHT BEARING RESTRICTIONS: No  FALLS: Has patient fallen in last 6 months? Yes. Number of falls 2 In the past few days trying to get to bathroom  LIVING ENVIRONMENT: Lives with: lives with their spouse Has following equipment at home: None  PLOF: Independent and Vocation/Vocational requirements: standing and walking all day long  PATIENT GOALS: To feel right again  OBJECTIVE:  Note: Objective measures were completed at Evaluation unless otherwise noted.  VITALS (Baseline):  117/98  HR 95 bpm  VITALS (After positional testing):  130/103, HR 97  COGNITION: Overall cognitive status: Within functional limits for tasks assessed   POSTURE:  Pt does hold head in slight lateral tilt to R  BED MOBILITY:  Very slowed and guarded  TRANSFERS: Assistive device utilized: None  Sit to stand: SBA Stand to sit: SBA  GAIT: Gait pattern:  slowed, guarded gait pattern, step through pattern, and wide BOS Distance walked: 25 ft Assistive device utilized: None Level of assistance: SBA Comments: slowed, guarded, reached out for doorway, furniture   PATIENT  SURVEYS:  DHI 82  VESTIBULAR ASSESSMENT:  GENERAL OBSERVATION: Pt appears anxious, reports he has been looking things up on the internet to try to figure out what is going on.  He does become increasingly sweaty during eval.     SYMPTOM BEHAVIOR:  Subjective history: Feels always "off".  He reports going in for CT scan for cardiology on 3/13 and when coming up from being in the machine, he felt dizzy, nauseous, and off balance.  He reports feeling "off", especially when coming up from sitting or standing up.  He does report several falls/stumbles due to unsteadiness when getting out of bed.  No hx of migraines, but does report increased congestion in head and chest with headache due to a cold (flu and Covid were negative in MD office 05/01/2023)  Non-Vestibular symptoms: diplopia, headaches, and nausea/vomiting  Type of dizziness: Diplopia, Imbalance (Disequilibrium), Spinning/Vertigo, Unsteady with head/body turns, and "Funny feeling in the head"  Frequency: "always off"  Duration: lasts all day  Aggravating factors: Induced by position change: supine to sit and Induced by motion: turning body quickly, turning head quickly, and standing too long can't pick things up from the floor  Relieving factors: head stationary, closing eyes, slow movements, and avoid busy/distracting environments  Progression of symptoms: worse  OCULOMOTOR EXAM:  Holds head in R lateral tilt.  difficult looking up to either side due to headache pain  Ocular Alignment: normal  Ocular ROM:  difficulty vertical>horizontal due to headache pain.  Spontaneous Nystagmus: absent  Gaze-Induced Nystagmus: absent  Smooth Pursuits:  difficult to assess due to headache pain  Saccades:  NA due to headache pain  Convergence/Divergence: NA cm   VESTIBULAR - OCULAR REFLEX: Headache with attempts at VOR and HIT  Slow VOR: Comment: see above  VOR Cancellation: Comment: NT due to headache  Head-Impulse Test: very slowed and increased  headache; difficult to accurately assess  Dynamic Visual Acuity: Not able to be assessed *No positive test of skew, HIT unable to assess fully   POSITIONAL TESTING: Right Dix-Hallpike: holds eyes closed, when he opens, note eyes fluttering in test position; no nystagmus noted; very nauseous upon return to sit; pt has double vision upon sitting up, with eyes converged and takes 10-15 seconds to regain focus Left Dix-Hallpike: no nystagmus and eyes fluttering in test position (difficulty keeping eyes open); nausea/dizzy symptoms upon coming up to sit, with eyes converged/crosseyed, double vision, takes 10-15 seconds to focus Right Roll Test: no nystagmus and difficulty maintaining eyes open Left Roll Test: no nystagmus and difficulty maintaining eyes open; upon sit EOM from this position, pt has nausea and dry heaves  TREATMENT DATE: 05/02/2023   PATIENT EDUCATION: Education details: Eval results, POC; no positional vertigo noted today; advise further follow up with MD regarding his significant headache in response to coughing and to eye/head motions; also advised that he speak to MD regarding BP measures Person educated: Patient Education method: Explanation Education comprehension: verbalized understanding  HOME EXERCISE PROGRAM: Not yet initiated  GOALS: Goals reviewed with patient? Yes  SHORT TERM GOALS: = LTGs  LONG TERM GOALS: Target date: 06/02/2023  Pt will be independent with HEP for improved dizziness, balance. Baseline:  Goal status: INITIAL  2.  Pt will report 0/10 dizziness with bed mobility. Baseline:  Goal status: INITIAL  3.  DHI score to improve by at least 18 points, to demo improved dizziness and balance for improved functional mobility. Baseline:  Goal status: INITIAL  4.  Dynamic Gait Index score to be assessed with pt to improve to at  least 19/24 for decreased fall risk. Baseline: TBA Goal status: INITIAL  SSESSMENT:  CLINICAL IMPRESSION: Patient is a 53 y.o. male who was seen today for physical therapy evaluation and treatment for BPPV.  Pt presents today reporting going in for CT scan for cardiology on 3/13 and when coming up from being in the machine, he felt dizzy, nauseous, and off balance.  He reports feeling "off", especially when coming up from sitting or standing up.  He does report several falls/stumbles due to unsteadiness/imbalance when getting out of bed.  No hx of migraines, but does report increased congestion in head and chest with headache due to a cold (flu and Covid were negative in MD office 05/01/2023).  With attempts at oculomotor testing, pt complains of increased headache; VOR testing is limited due to pt's c/o increased headache.  *Of note, HINTS test was negative for test of skew, Head impulse test was limited in speed due to pt's headache with head/eye motion.  Attempted positional testing for R and L posterior canal and R and L horizontal canal; nystagmus is not present in any position; however, pt has significant eye fluttering in each position and has difficulty keeping eyes open.  With sitting up from Dix-Hallpike positions, he has eye convergence (crossing of eyes) and double vision, which takes10-15 seconds to resolve.  With sitting up EOM from L sidelying, pt becomes nauseous and has dry heaves.  Positional testing was negative for nystagmus, however, pt is very clearly motion sensitive to all motions lying down and coming up from supine and sidelying positions.  His BP measures were slightly elevated and headache limits full testing.  Advised pt to follow up with MD regarding headaches, congestion, BP; recommend pt follow up with additional appointment later this week for further vestibular testing and treatment.  OBJECTIVE IMPAIRMENTS: Abnormal gait, decreased activity tolerance, decreased balance,  decreased mobility, difficulty walking, dizziness, and pain.   ACTIVITY LIMITATIONS: bending, sitting, standing, sleeping, transfers, bed mobility, reach over head, and locomotion level  PARTICIPATION LIMITATIONS: meal prep, cleaning, laundry, community activity, and occupation  PERSONAL FACTORS: 3+ comorbidities: see above  are also affecting patient's functional outcome.   REHAB POTENTIAL: Good  CLINICAL DECISION MAKING: Evolving/moderate complexity  EVALUATION COMPLEXITY: Moderate   PLAN:  PT FREQUENCY: 1-2x/week  PT DURATION: 4 weeks  PLANNED INTERVENTIONS: 97110-Therapeutic exercises, 97530- Therapeutic activity, 97112- Neuromuscular re-education, 97535- Self Care, 78295- Manual therapy, [id number]- Canalith repositioning, Patient/Family education, Balance training, and Vestibular training  PLAN FOR NEXT SESSION: Retest positional vertigo testing; complete VOR and oculomotor testing if headache is not limiting.  Treat as appropriate.   Gean Maidens., PT 05/02/2023, 2:20 PM   [facility] [address] [place], [address] [place], [place] [address] Phone # [phone] Fax # [phone]

## 2023-05-03 ENCOUNTER — Telehealth: Payer: Self-pay | Admitting: Internal Medicine

## 2023-05-03 NOTE — Telephone Encounter (Signed)
 FMLA forms to be filled out- placed in dr's folder.  Please fax to [phone] upon completion.  Please call patient once forms are faxed.

## 2023-05-04 DIAGNOSIS — Z0279 Encounter for issue of other medical certificate: Secondary | ICD-10-CM

## 2023-05-04 NOTE — Telephone Encounter (Signed)
 Placed into Dr Hardie Shackleton folder

## 2023-05-05 ENCOUNTER — Ambulatory Visit

## 2023-05-05 ENCOUNTER — Telehealth: Payer: Self-pay | Admitting: Internal Medicine

## 2023-05-05 DIAGNOSIS — R2681 Unsteadiness on feet: Secondary | ICD-10-CM

## 2023-05-05 DIAGNOSIS — R42 Dizziness and giddiness: Secondary | ICD-10-CM

## 2023-05-05 NOTE — Telephone Encounter (Signed)
 Pt states he was written out of work until May 06, 2023. Says his condition has not improved and is requesting the work excuse be extended to March 24, when he has an appointment with the provider to discuss the conditon

## 2023-05-05 NOTE — Therapy (Signed)
 OUTPATIENT PHYSICAL THERAPY VESTIBULAR TREATMENT     Patient Name: Jacob Williamson MRN: [id number] DOB:1971-01-09, 53 y.o., male Today's Date: 05/05/2023  END OF SESSION:  PT End of Session - 05/05/23 0844     Visit Number 2    Number of Visits 8    Date for PT Re-Evaluation 05/26/23    Authorization Type BCBS    PT Start Time 0845    PT Stop Time 0930    PT Time Calculation (min) 45 min    Activity Tolerance Patient limited by pain;Other (comment)   nausea, sweating   Behavior During Therapy [facility] for tasks assessed/performed;Anxious             Past Medical History:  Diagnosis Date   Asthma    past hx - not current    Diverticulosis    sigmoid colon and descending colon   GERD (gastroesophageal reflux disease)    Hyperlipidemia    no medications    Obesity (BMI 35.0-39.9 without comorbidity)    Past Surgical History:  Procedure Laterality Date   COLONOSCOPY  05/20/2019   Patient Active Problem List   Diagnosis Date Noted   Primary hypertension 02/16/2023   Tendinitis of left triceps 05/05/2021   Acute right-sided thoracic back pain 12/19/2019   Vitamin D deficiency 05/07/2019   Vitamin B12 deficiency 05/07/2019   IGT (impaired glucose tolerance) 05/07/2019   Hyperlipidemia 05/07/2019   Obesity (BMI 35.0-39.9 without comorbidity)    GERD (gastroesophageal reflux disease)     PCP: Jacob Williamson, Jacob Patricia, MD  REFERRING PROVIDER: Philip Williamson, Jacob Patricia, MD   REFERRING DIAG: H81.11 (ICD-10-CM) - BPPV (benign paroxysmal positional vertigo), right  THERAPY DIAG:  Dizziness and giddiness  Unsteadiness on feet  ONSET DATE: 05/01/2023  Rationale for Evaluation and Treatment: Rehabilitation  SUBJECTIVE:   SUBJECTIVE STATEMENT: respiratory symptoms are better.  Notes in AM "my sight has to catch up with everything".  Reports a constant feeling of sensitivity/dizziness along his forehead with head rotation.  When laying down he reports having to  move slowly to reduce spinning nausea.  When laying down and changing positions has to move slowly and when arising from laying down moving slowly to reduce onset of symptoms.  When arising reports sensation of room spinning and internal spinning Pt accompanied by: self  PERTINENT HISTORY: Jacob Williamson is a 53 y.o. male who is coming in today for the above mentioned reasons.  On the 13th he had a coronary CT scan.  As he was getting up from the CT scanner he had immediate onset of severe vertigo, nausea and balance disorder.  It is still present today although getting better.  He has been using Dramamine with some relief.  2 days ago he started experiencing extreme fatigue, diaphoresis, head cold symptoms with congestion, runny nose, cough and headache.   PMH asthma, GERD, HLD, obesity  PAIN:  Are you having pain? Yes: NPRS scale: 8/10 Pain location: head ache Pain description: "" pain/weird feeling Aggravating factors: movement/head rotation Relieving factors: rest, tylenol  PRECAUTIONS: Fall  RED FLAGS: None   WEIGHT BEARING RESTRICTIONS: No  FALLS: Has patient fallen in last 6 months? Yes. Number of falls 2 In the past few days trying to get to bathroom  LIVING ENVIRONMENT: Lives with: lives with their spouse Has following equipment at home: None  PLOF: Independent and Vocation/Vocational requirements: standing and walking all day long  PATIENT GOALS: To feel right again  OBJECTIVE:   TODAY'S TREATMENT:  05/05/23 Activity Comments  Completion of oculomotor and VOR testing--see below   Positional testing  Horizontal Roll test: Right- no dizziness/nystagmus Left- no dizziness/nystagmus Dix-Hallpike R: no dizziness/nystagmus Dix-Hallpike L: no dizziness/nystagmus  Seated saccades 1x10 horizontal/vertical   Discussion of symptoms and education regarding vestibular migraine criteria to guide further discussion with referring MD           PATIENT EDUCATION: Education  details: discussion of assessment details.  Reviewed information that would rule-out BPPV. Provided education regarding vestibular migraine for further discussion with MD Person educated: Patient Education method: Explanation Education comprehension: verbalized understanding  HOME EXERCISE PROGRAM: Access Code: [email or web address] URL: [email or web address] Date: 05/05/2023 Prepared by: Jacob Williamson  Exercises - Seated Horizontal Saccades  - 1 x daily - 7 x weekly - 3 sets - 10 reps - Seated Vertical Saccades  - 1 x daily - 7 x weekly - 3 sets - 10 reps Note: Objective measures were completed at Evaluation unless otherwise noted.  VITALS (Baseline):  117/98  HR 95 bpm     COGNITION: Overall cognitive status: Within functional limits for tasks assessed   POSTURE:  Pt does hold head in slight lateral tilt to R  BED MOBILITY:  Very slowed and guarded  TRANSFERS: Assistive device utilized: None  Sit to stand: SBA Stand to sit: SBA  GAIT: Gait pattern:  slowed, guarded gait pattern, step through pattern, and wide BOS Distance walked: 25 ft Assistive device utilized: None Level of assistance: SBA Comments: slowed, guarded, reached out for doorway, furniture   PATIENT SURVEYS:  DHI 82  VESTIBULAR ASSESSMENT:  GENERAL OBSERVATION: Pt appears anxious, reports he has been looking things up on the internet to try to figure out what is going on.  He does become increasingly sweaty during eval.     SYMPTOM BEHAVIOR:  Subjective history: Feels always "off".  He reports going in for CT scan for cardiology on 3/13 and when coming up from being in the machine, he felt dizzy, nauseous, and off balance.  He reports feeling "off", especially when coming up from sitting or standing up.  He does report several falls/stumbles due to unsteadiness when getting out of bed.  No hx of migraines, but does report increased congestion in head and chest with headache due to a cold (flu and Covid  were negative in MD office 05/01/2023)  Non-Vestibular symptoms: diplopia, headaches, and nausea/vomiting  Type of dizziness: Diplopia, Imbalance (Disequilibrium), Spinning/Vertigo, Unsteady with head/body turns, and "Funny feeling in the head"  Frequency: "always off"  Duration: lasts all day  Aggravating factors: Induced by position change: supine to sit and Induced by motion: turning body quickly, turning head quickly, and standing too long can't pick things up from the floor  Relieving factors: head stationary, closing eyes, slow movements, and avoid busy/distracting environments  Progression of symptoms: worse  OCULOMOTOR EXAM:  Holds head in R lateral tilt.  difficult looking up to either side due to headache pain  Ocular Alignment: normal  Ocular ROM:  difficulty vertical>horizontal due to headache pain.  Spontaneous Nystagmus: absent  Gaze-Induced Nystagmus: absent  Smooth Pursuits:  WNL--notes increased HA  Saccades:  poor accuracy all directions--reports forehead discomfort  Convergence/Divergence: 3 cm   VESTIBULAR - OCULAR REFLEX:   Slow VOR: horizontal-initial difficulty with horizontal with refixation, improved with trials; vertical-- no issue  VOR Cancellation: Comment: WNL--reports onset of dizziness  Head-Impulse Test: Difficulty maintaining eye contact of target at rest and with head thrust  Dynamic Visual Acuity: Not able  to be assessed *No positive test of skew, HIT unable to assess fully   POSITIONAL TESTING: Right Dix-Hallpike: holds eyes closed, when he opens, note eyes fluttering in test position; no nystagmus noted; very nauseous upon return to sit; pt has double vision upon sitting up, with eyes converged and takes 10-15 seconds to regain focus Left Dix-Hallpike: no nystagmus and eyes fluttering in test position (difficulty keeping eyes open); nausea/dizzy symptoms upon coming up to sit, with eyes converged/crosseyed, double vision, takes 10-15 seconds to  focus Right Roll Test: no nystagmus and difficulty maintaining eyes open Left Roll Test: no nystagmus and difficulty maintaining eyes open; upon sit EOM from this position, pt has nausea and dry heaves ---see above in "today's treatment" for positional testing results                                                                                                                           TREATMENT DATE: 05/02/2023     GOALS: Goals reviewed with patient? Yes  SHORT TERM GOALS: = LTGs  LONG TERM GOALS: Target date: 06/02/2023  Pt will be independent with HEP for improved dizziness, balance. Baseline:  Goal status: INITIAL  2.  Pt will report 0/10 dizziness with bed mobility. Baseline:  Goal status: INITIAL  3.  DHI score to improve by at least 18 points, to demo improved dizziness and balance for improved functional mobility. Baseline:  Goal status: INITIAL  4.  Dynamic Gait Index score to be assessed with pt to improve to at least 19/24 for decreased fall risk. Baseline: TBA Goal status: INITIAL  SSESSMENT:  CLINICAL IMPRESSION: Returns to clinic and reports his respiratory symptoms are much improved.  Notes ongoing discomfort to forehead with HA and motion sensitivity with right rotation > left.  Saccades reveals very slow/hypometric movements with poor accuracy.  Smooth pursuits WNL other than increase to his HA discomfort.  VOR x 1 initially with difficulty in horizontal plane but with cues able to correct, no issue to vertical plane.VOR cancellation WNL other than motion sensitivity experienced.  Positional testing unrevealing and not provocative for positional dizziness other than slight motion sensitivity reported with arising from test positions. Discussed constellation of symptoms that pt reports nausea, dizziness, HA, visual disorientation,  motion sensitivity x 1 week onset and reports some improvement in past two days since his respiratory issues have subsided.  Discussed  topic of vestibular migraine for further discussion with MD for further assessment.  Initiated HEP with seated saccades horizontal/vertical to improve upon these deficits with good return demonstration.    OBJECTIVE IMPAIRMENTS: Abnormal gait, decreased activity tolerance, decreased balance, decreased mobility, difficulty walking, dizziness, and pain.   ACTIVITY LIMITATIONS: bending, sitting, standing, sleeping, transfers, bed mobility, reach over head, and locomotion level  PARTICIPATION LIMITATIONS: meal prep, cleaning, laundry, community activity, and occupation  PERSONAL FACTORS: 3+ comorbidities: see above  are also affecting patient's functional outcome.   REHAB POTENTIAL: Good  CLINICAL DECISION MAKING: Evolving/moderate  complexity  EVALUATION COMPLEXITY: Moderate   PLAN:  PT FREQUENCY: 1-2x/week  PT DURATION: 4 weeks  PLANNED INTERVENTIONS: 97110-Therapeutic exercises, 97530- Therapeutic activity, 97112- Neuromuscular re-education, 97535- Self Care, 19147- Manual therapy, [phone]- Canalith repositioning, Patient/Family education, Balance training, and Vestibular training  PLAN FOR NEXT SESSION: assess symptoms, review HEP, address motion sensitivity if lingering via habituation, balance testing as indicated   Dion Body, PT 05/05/2023, 8:44 AM   [facility] at [facility] [address], [address] [place], [place] [address] Phone # [phone] Fax # [phone]

## 2023-05-08 ENCOUNTER — Ambulatory Visit: Admitting: Internal Medicine

## 2023-05-08 ENCOUNTER — Encounter: Payer: Self-pay | Admitting: Internal Medicine

## 2023-05-08 ENCOUNTER — Ambulatory Visit: Admitting: Family Medicine

## 2023-05-08 VITALS — BP 110/70 | HR 91 | Temp 98.0°F | Wt 244.9 lb

## 2023-05-08 DIAGNOSIS — H8309 Labyrinthitis, unspecified ear: Secondary | ICD-10-CM | POA: Diagnosis not present

## 2023-05-08 MED ORDER — PREDNISONE 10 MG (21) PO TBPK: ORAL_TABLET | ORAL | 0 refills | Status: AC

## 2023-05-08 NOTE — Telephone Encounter (Signed)
 Discussed at office visit 05/08/23.

## 2023-05-08 NOTE — Progress Notes (Signed)
 Established Patient Office Visit     CC/Reason for Visit: Dizziness  HPI: Jacob Williamson is a 53 y.o. male who is coming in today for the above mentioned reasons.  He was seen previously for abrupt onset of dizziness after coming out of the CT scanner.  He was sent to vestibular therapy for possible BPPV.  He has not had any positional nystagmus on 2 evaluations he has had so far.  Now he tells me he had a URI symptoms going on at the same time.  His URI symptoms are improving.   Past Medical/Surgical History: Past Medical History:  Diagnosis Date   Asthma    past hx - not current    Diverticulosis    sigmoid colon and descending colon   GERD (gastroesophageal reflux disease)    Hyperlipidemia    no medications    Obesity (BMI 35.0-39.9 without comorbidity)     Past Surgical History:  Procedure Laterality Date   COLONOSCOPY  05/20/2019    Social History:  reports that he has quit smoking. He has never used smokeless tobacco. He reports current alcohol use. He reports that he does not use drugs.  Allergies: Allergies  Allergen Reactions   Shellfish Allergy Anaphylaxis, Itching and Swelling   Catfish [Fish Allergy] Itching and Other (See Comments)    Mainly focused on throat and inside mouth, itching    Other     [place] nuts    Family History:  Family History  Problem Relation Age of Onset   Hypertension Mother    Diabetes Mother    Hypertension Father    Asthma Father    Diabetes Maternal Grandmother    Colon cancer Neg Hx    Colon polyps Neg Hx    Esophageal cancer Neg Hx    Rectal cancer Neg Hx    Stomach cancer Neg Hx      Current Outpatient Medications:    albuterol (VENTOLIN HFA) 108 (90 Base) MCG/ACT inhaler, Inhale 2 puffs into the lungs every 6 (six) hours as needed for wheezing or shortness of breath., Disp: 8 g, Rfl: 2   amLODipine (NORVASC) 5 MG tablet, Take 1 tablet (5 mg total) by mouth daily., Disp: 90 tablet, Rfl: 1   BLACK  CURRANT SEED OIL PO, Take by mouth daily., Disp: , Rfl:    cyanocobalamin (VITAMIN B12) 1000 MCG/ML injection, Inject 1ml in deltoid once weekly for 4 weeks, then inject 1 ml once a month thereafter, Disp: 6 mL, Rfl: 1   Multiple Vitamin (MULTIVITAMIN) tablet, Take 1 tablet by mouth daily., Disp: , Rfl:    nitroGLYCERIN (NITRODUR - DOSED IN MG/24 HR) 0.2 mg/hr patch, Place 1 patch (0.2 mg total) onto the skin daily. Place 1/4 of the patch over the affected area., Disp: 30 patch, Rfl: 1   predniSONE (STERAPRED UNI-PAK 21 TAB) 10 MG (21) TBPK tablet, Take as directed, Disp: 21 tablet, Rfl: 0   SYRINGE-NEEDLE, DISP, 3 ML (BD SAFETYGLIDE SYRINGE/NEEDLE) 25G X 1" 3 ML MISC, Use for B12 injections, Disp: 100 each, Rfl: 11   Vitamin D, Ergocalciferol, (DRISDOL) 1.25 MG (50000 UNIT) CAPS capsule, Take 1 capsule (50,000 Units total) by mouth every 7 (seven) days for 12 doses., Disp: 12 capsule, Rfl: 0  Review of Systems:  Negative unless indicated in HPI.   Physical Exam: Vitals:   05/08/23 1309  BP: 110/70  Pulse: 91  Temp: 98 F (36.7 C)  TempSrc: Oral  SpO2: 97%  Weight: 244  lb 14.4 oz (111.1 kg)    Body mass index is 37.24 kg/m.    Impression and Plan:  Labyrinthitis, unspecified laterality -     predniSONE; Take as directed  Dispense: 21 tablet; Refill: 0  -Now that I have the context of this occurring in the setting of an acute URI, I think this is likely acute labyrinthitis.  Will send in a short course of prednisone to see if this helps.  He will continue vestibular therapy.  If no improvement can consider ENT referral.  I will write him out of work until the 27th.   Time spent:30 minutes reviewing chart, interviewing and examining patient and formulating plan of care.     Chaya Jan, MD [facility]

## 2023-05-08 NOTE — Telephone Encounter (Signed)
 Faxed and confirmed.  Copy given to Ms Margarett.

## 2023-05-10 ENCOUNTER — Encounter: Payer: Self-pay | Admitting: Physical Therapy

## 2023-05-10 ENCOUNTER — Telehealth: Payer: Self-pay | Admitting: Internal Medicine

## 2023-05-10 ENCOUNTER — Ambulatory Visit: Admitting: Physical Therapy

## 2023-05-10 DIAGNOSIS — R2681 Unsteadiness on feet: Secondary | ICD-10-CM

## 2023-05-10 DIAGNOSIS — R42 Dizziness and giddiness: Secondary | ICD-10-CM

## 2023-05-10 NOTE — Telephone Encounter (Signed)
 Patient is aware and would like a note for work allowing him to have time off for PT.

## 2023-05-10 NOTE — Telephone Encounter (Signed)
 Per PT note:  PT FREQUENCY: 1-2x/week   PT DURATION: 4 weeks

## 2023-05-10 NOTE — Telephone Encounter (Signed)
 [employer] is calling and pt is checking on his work note

## 2023-05-10 NOTE — Telephone Encounter (Signed)
 Letter given to patient per Dr Ardyth Harps.

## 2023-05-10 NOTE — Telephone Encounter (Signed)
 Pt is having treatment twice a wk on Monday and Wednesday for at least one hr per sessions

## 2023-05-10 NOTE — Therapy (Signed)
 OUTPATIENT PHYSICAL THERAPY VESTIBULAR TREATMENT     Patient Name: Jacob Williamson MRN: [id number] DOB:06/30/1970, 53 y.o., male Today's Date: 05/10/2023  END OF SESSION:  PT End of Session - 05/10/23 0845     Visit Number 3    Number of Visits 8    Date for PT Re-Evaluation 05/26/23    Authorization Type [employer]    PT Start Time 0845    PT Stop Time 0933    PT Time Calculation (min) 48 min    Activity Tolerance Patient tolerated treatment well    Behavior During Therapy [facility] for tasks assessed/performed              Past Medical History:  Diagnosis Date   Asthma    past hx - not current    Diverticulosis    sigmoid colon and descending colon   GERD (gastroesophageal reflux disease)    Hyperlipidemia    no medications    Obesity (BMI 35.0-39.9 without comorbidity)    Past Surgical History:  Procedure Laterality Date   COLONOSCOPY  05/20/2019   Patient Active Problem List   Diagnosis Date Noted   Primary hypertension 02/16/2023   Tendinitis of left triceps 05/05/2021   Acute right-sided thoracic back pain 12/19/2019   Vitamin D deficiency 05/07/2019   Vitamin B12 deficiency 05/07/2019   IGT (impaired glucose tolerance) 05/07/2019   Hyperlipidemia 05/07/2019   Obesity (BMI 35.0-39.9 without comorbidity)    GERD (gastroesophageal reflux disease)     PCP: Philip Aspen, Limmie Patricia, MD  REFERRING PROVIDER: Philip Aspen, Limmie Patricia, MD   REFERRING DIAG: H81.11 (ICD-10-CM) - BPPV (benign paroxysmal positional vertigo), right  THERAPY DIAG:  Dizziness and giddiness  Unsteadiness on feet  ONSET DATE: 05/01/2023  Rationale for Evaluation and Treatment: Rehabilitation  SUBJECTIVE:   SUBJECTIVE STATEMENT: Feel much better and I'm on prednisone now.  It wasn't as bad this morning when I get up.  I'm trying to avoid those movements that bring on the dizziness.  Pt accompanied by: self  PERTINENT HISTORY: Jacob Williamson is a 53 y.o. male who is  coming in today for the above mentioned reasons.  On the 13th he had a coronary CT scan.  As he was getting up from the CT scanner he had immediate onset of severe vertigo, nausea and balance disorder.  It is still present today although getting better.  He has been using Dramamine with some relief.  2 days ago he started experiencing extreme fatigue, diaphoresis, head cold symptoms with congestion, runny nose, cough and headache.   PMH asthma, GERD, HLD, obesity  PAIN:  Are you having pain? No  PRECAUTIONS: Fall  RED FLAGS: None   WEIGHT BEARING RESTRICTIONS: No  FALLS: Has patient fallen in last 6 months? Yes. Number of falls 2 In the past few days trying to get to bathroom  LIVING ENVIRONMENT: Lives with: lives with their spouse Has following equipment at home: None  PLOF: Independent and Vocation/Vocational requirements: standing and walking all day long  PATIENT GOALS: To feel right again  OBJECTIVE:    TODAY'S TREATMENT: 05/10/2023 Activity Comments  Baseline symptoms 5-6/10 Almost dizzy, unsteady  DGI 13/24 Scores <19/24 indicate increased fall risk  Reviewed saccades from HEP 6/10 symptoms horizontal Mild increase in symptoms vertical  Horizontal VOR in sitting, 3 reps x 10 5/10, 5-6/10, 5/10 symptoms  Seated head turns R and L to target, 3 reps Brings on pressure in head, symptoms settle between reps  Habituation with  bed mobility, x 2 reps Slowed, but 4/10 symptoms and settle between reps    [facility] PT Assessment - 05/10/23 0854       Standardized Balance Assessment   Standardized Balance Assessment Dynamic Gait Index      Dynamic Gait Index   Level Surface Mild Impairment   7.8 sec   Change in Gait Speed Moderate Impairment   veering   Gait with Horizontal Head Turns Moderate Impairment   >12 sec   Gait with Vertical Head Turns Mild Impairment   8.93   Gait and Pivot Turn Moderate Impairment   4.72 sed   Step Over Obstacle Mild Impairment    Step Around  Obstacles Mild Impairment    Steps Mild Impairment    Total Score 13    DGI comment: Scores <13/24 indicate increased fall risk                 PATIENT EDUCATION: Education details: Reviewed and educated on symptom management with exercises; discussed walking for activity level (for safety-have someone with him if he's walking outdoors), good hydration,encouraging increased activity level; habituation with bed mobility.  Discussed results of DGI and fall risk per 13/24 score; additions/progressions of HEP.   Person educated: Patient Education method: Explanation Education comprehension: verbalized understanding  HOME EXERCISE PROGRAM: Access Code: [email or web address] URL: [email or web address] Date: 05/05/2023 Prepared by: Shary Decamp  Exercises - Seated Horizontal Saccades  - 1 x daily - 7 x weekly - 3 sets - 10 reps - Seated Vertical Saccades  - 1 x daily - 7 x weekly - 3 sets - 10 reps Note: Objective measures were completed at Evaluation unless otherwise noted.  VITALS (Baseline):  117/98  HR 95 bpm     COGNITION: Overall cognitive status: Within functional limits for tasks assessed   POSTURE:  Pt does hold head in slight lateral tilt to R  BED MOBILITY:  Very slowed and guarded  TRANSFERS: Assistive device utilized: None  Sit to stand: SBA Stand to sit: SBA  GAIT: Gait pattern:  slowed, guarded gait pattern, step through pattern, and wide BOS Distance walked: 25 ft Assistive device utilized: None Level of assistance: SBA Comments: slowed, guarded, reached out for doorway, furniture   PATIENT SURVEYS:  DHI 82  VESTIBULAR ASSESSMENT:  GENERAL OBSERVATION: Pt appears anxious, reports he has been looking things up on the internet to try to figure out what is going on.  He does become increasingly sweaty during eval.     SYMPTOM BEHAVIOR:  Subjective history: Feels always "off".  He reports going in for CT scan for cardiology on 3/13 and when  coming up from being in the machine, he felt dizzy, nauseous, and off balance.  He reports feeling "off", especially when coming up from sitting or standing up.  He does report several falls/stumbles due to unsteadiness when getting out of bed.  No hx of migraines, but does report increased congestion in head and chest with headache due to a cold (flu and Covid were negative in MD office 05/01/2023)  Non-Vestibular symptoms: diplopia, headaches, and nausea/vomiting  Type of dizziness: Diplopia, Imbalance (Disequilibrium), Spinning/Vertigo, Unsteady with head/body turns, and "Funny feeling in the head"  Frequency: "always off"  Duration: lasts all day  Aggravating factors: Induced by position change: supine to sit and Induced by motion: turning body quickly, turning head quickly, and standing too long can't pick things up from the floor  Relieving factors: head stationary, closing eyes, slow movements, and avoid busy/distracting environments  Progression of symptoms: worse  OCULOMOTOR EXAM:  Holds head in R lateral tilt.  difficult looking up to either side due to headache pain  Ocular Alignment: normal  Ocular ROM:  difficulty vertical>horizontal due to headache pain.  Spontaneous Nystagmus: absent  Gaze-Induced Nystagmus: absent  Smooth Pursuits:  WNL--notes increased HA  Saccades:  poor accuracy all directions--reports forehead discomfort  Convergence/Divergence: 3 cm   VESTIBULAR - OCULAR REFLEX:   Slow VOR: horizontal-initial difficulty with horizontal with refixation, improved with trials; vertical-- no issue  VOR Cancellation: Comment: WNL--reports onset of dizziness  Head-Impulse Test: Difficulty maintaining eye contact of target at rest and with head thrust  Dynamic Visual Acuity: Not able to be assessed *No positive test of skew, HIT unable to assess fully   POSITIONAL TESTING: Right Dix-Hallpike: holds eyes closed, when he opens, note eyes fluttering in test position; no nystagmus  noted; very nauseous upon return to sit; pt has double vision upon sitting up, with eyes converged and takes 10-15 seconds to regain focus Left Dix-Hallpike: no nystagmus and eyes fluttering in test position (difficulty keeping eyes open); nausea/dizzy symptoms upon coming up to sit, with eyes converged/crosseyed, double vision, takes 10-15 seconds to focus Right Roll Test: no nystagmus and difficulty maintaining eyes open Left Roll Test: no nystagmus and difficulty maintaining eyes open; upon sit EOM from this position, pt has nausea and dry heaves ---see above in "today's treatment" for positional testing results                                                                                                                           TREATMENT DATE: 05/02/2023     GOALS: Goals reviewed with patient? Yes  SHORT TERM GOALS: = LTGs  LONG TERM GOALS: Target date: 06/02/2023  Pt will be independent with HEP for improved dizziness, balance. Baseline:  Goal status: INITIAL  2.  Pt will report 0/10 dizziness with bed mobility. Baseline:  Goal status: INITIAL  3.  DHI score to improve by at least 18 points, to demo improved dizziness and balance for improved functional mobility. Baseline:  Goal status: INITIAL  4.  Dynamic Gait Index score to be assessed with pt to improve to at least 19/24 for decreased fall risk. Baseline: TBA Goal status: INITIAL  SSESSMENT:  CLINICAL IMPRESSION: Pt presents today and feels better than last visit; has been given Prednisone to address acute labrynthitis symptoms per MD.  Skilled PT session focused on assessing Dynamic Gait Index (DGI), with score of 13/24 indicating increased fall risk.  He slows pace with gait with head turns and veers to R with change of speeds and with head motions.  Also focused on progressing HEP, with additions of VOR exercises sitting and head motions, as well as habituation with bed mobility.  Pt needs cues and asks appropriate  questions throughout.  Educated pt in importance of habituation exercises to help encourage gradually increased mobility and return to typical motion, in setting  of labrynthitis.  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  Pt will continue to benefit from skilled PT towards goals for improved functional mobility and decreased fall risk.   OBJECTIVE IMPAIRMENTS: Abnormal gait, decreased activity tolerance, decreased balance, decreased mobility, difficulty walking, dizziness, and pain.   ACTIVITY LIMITATIONS: bending, sitting, standing, sleeping, transfers, bed mobility, reach over head, and locomotion level  PARTICIPATION LIMITATIONS: meal prep, cleaning, laundry, community activity, and occupation  PERSONAL FACTORS: 3+ comorbidities: see above  are also affecting patient's functional outcome.   REHAB POTENTIAL: Good  CLINICAL DECISION MAKING: Evolving/moderate complexity  EVALUATION COMPLEXITY: Moderate   PLAN:  PT FREQUENCY: 1-2x/week  PT DURATION: 4 weeks  PLANNED INTERVENTIONS: 97110-Therapeutic exercises, 97530- Therapeutic activity, 97112- Neuromuscular re-education, 97535- Self Care, 21308- Manual therapy, [phone]- Canalith repositioning, Patient/Family education, Balance training, and Vestibular training  PLAN FOR NEXT SESSION:  review and progress HEP, address motion sensitivity with habituation, progress to standing activities to incorporate balance testing   Jaysin Gayler W., PT 05/10/2023, 9:38 AM   [facility] at [facility] [address] [place], [place] [address] Phone # [phone] Fax # [phone]

## 2023-05-10 NOTE — Telephone Encounter (Signed)
 Patient states he went to physical therapy today for his dizziness.  The physical therapist wants pt to do PT twice a week instead of once a week.  He will need a work note for his job.  He states the physical therapist wants him to stay out of work longer due to the dizziness.  She is sending the PT notes over for Dr. Ardyth Harps to review.   Please call patient back.

## 2023-05-15 ENCOUNTER — Ambulatory Visit

## 2023-05-15 DIAGNOSIS — R42 Dizziness and giddiness: Secondary | ICD-10-CM | POA: Diagnosis not present

## 2023-05-15 DIAGNOSIS — R2681 Unsteadiness on feet: Secondary | ICD-10-CM

## 2023-05-15 NOTE — Therapy (Signed)
 OUTPATIENT PHYSICAL THERAPY VESTIBULAR TREATMENT     Patient Name: Jacob Williamson MRN: [id number] DOB:1970/10/02, 53 y.o., male Today's Date: 05/15/2023  END OF SESSION:  PT End of Session - 05/15/23 1015     Visit Number 4    Number of Visits 8    Date for PT Re-Evaluation 05/26/23    Authorization Type BCBS    PT Start Time 1015    PT Stop Time 1100    PT Time Calculation (min) 45 min    Activity Tolerance Patient tolerated treatment well    Behavior During Therapy [facility] for tasks assessed/performed              Past Medical History:  Diagnosis Date   Asthma    past hx - not current    Diverticulosis    sigmoid colon and descending colon   GERD (gastroesophageal reflux disease)    Hyperlipidemia    no medications    Obesity (BMI 35.0-39.9 without comorbidity)    Past Surgical History:  Procedure Laterality Date   COLONOSCOPY  05/20/2019   Patient Active Problem List   Diagnosis Date Noted   Primary hypertension 02/16/2023   Tendinitis of left triceps 05/05/2021   Acute right-sided thoracic back pain 12/19/2019   Vitamin D deficiency 05/07/2019   Vitamin B12 deficiency 05/07/2019   IGT (impaired glucose tolerance) 05/07/2019   Hyperlipidemia 05/07/2019   Obesity (BMI 35.0-39.9 without comorbidity)    GERD (gastroesophageal reflux disease)     PCP: Philip Aspen, Limmie Patricia, MD  REFERRING PROVIDER: Philip Aspen, Limmie Patricia, MD   REFERRING DIAG: H81.11 (ICD-10-CM) - BPPV (benign paroxysmal positional vertigo), right  THERAPY DIAG:  Dizziness and giddiness  Unsteadiness on feet  ONSET DATE: 05/01/2023  Rationale for Evaluation and Treatment: Rehabilitation  SUBJECTIVE:   SUBJECTIVE STATEMENT: Feeling some better.  Squatting down and standing up and bending over still provoke symptoms.  Noting at work relying on using cart for stability when walking.  Eye sight is still blurry when focusing on computer screen. Current symptoms 3/10 with  imbalance with occasional dizziness with exertion (walking too fast).  Vertical and lateral gaze are not as symptomatic Pt accompanied by: self  PERTINENT HISTORY: Jacob Williamson is a 53 y.o. male who is coming in today for the above mentioned reasons.  On the 13th he had a coronary CT scan.  As he was getting up from the CT scanner he had immediate onset of severe vertigo, nausea and balance disorder.  It is still present today although getting better.  He has been using Dramamine with some relief.  2 days ago he started experiencing extreme fatigue, diaphoresis, head cold symptoms with congestion, runny nose, cough and headache.   PMH asthma, GERD, HLD, obesity  PAIN:  Are you having pain? No  PRECAUTIONS: Fall  RED FLAGS: None   WEIGHT BEARING RESTRICTIONS: No  FALLS: Has patient fallen in last 6 months? Yes. Number of falls 2 In the past few days trying to get to bathroom  LIVING ENVIRONMENT: Lives with: lives with their spouse Has following equipment at home: None  PLOF: Independent and Vocation/Vocational requirements: standing and walking all day long  PATIENT GOALS: To feel right again  OBJECTIVE:   TODAY'S TREATMENT: 05/15/23 Activity Comments  HEP review Improved comfort to saccades Guarded to VOR w/ slow movements due to provoked symptoms  Brandt-Daroff x 4 reps Slow, guarded, extensive cues for sequence. Requiring 60-90 sec in sidelying for symptoms to subside. Notes  increased discomfort to left dependent position, 6/10  M-CTSIB Condition 1: normal Condition 2: normal-mild Condition 3: mild Condition 4: moderate X 30 sec for all                     PATIENT EDUCATION: Education details: Reviewed and educated on symptom management with exercises; discussed walking for activity level (for safety-have someone with him if he's walking outdoors), good hydration,encouraging increased activity level; habituation with bed mobility.  Discussed results of DGI  and fall risk per 13/24 score; additions/progressions of HEP.   Person educated: Patient Education method: Explanation Education comprehension: verbalized understanding  HOME EXERCISE PROGRAM: Access Code: [email or web address] URL: [email or web address] Date: 05/05/2023 Prepared by: Shary Decamp  Exercises - Seated Horizontal Saccades  - 1 x daily - 7 x weekly - 3 sets - 10 reps - Seated Vertical Saccades  - 1 x daily - 7 x weekly - 3 sets - 10 reps - Seated Gaze Stabilization with Head Rotation  - 1-2 x daily - 7 x weekly - 3 sets - 10 reps - Seated Left Head Turns Vestibular Habituation  - 1-2 x daily - 7 x weekly - 3 sets - 5 reps - Seated Right Head Turns Vestibular Habituation  - 1-2 x daily - 7 x weekly - 3 sets - 5 reps - Brandt-Daroff Vestibular Exercise  - 1 x daily - 7 x weekly - 1-2 sets - 5 reps  Note: Objective measures were completed at Evaluation unless otherwise noted.  VITALS (Baseline):  117/98  HR 95 bpm     COGNITION: Overall cognitive status: Within functional limits for tasks assessed   POSTURE:  Pt does hold head in slight lateral tilt to R  BED MOBILITY:  Very slowed and guarded  TRANSFERS: Assistive device utilized: None  Sit to stand: SBA Stand to sit: SBA  GAIT: Gait pattern:  slowed, guarded gait pattern, step through pattern, and wide BOS Distance walked: 25 ft Assistive device utilized: None Level of assistance: SBA Comments: slowed, guarded, reached out for doorway, furniture   PATIENT SURVEYS:  DHI 82  VESTIBULAR ASSESSMENT:  GENERAL OBSERVATION: Pt appears anxious, reports he has been looking things up on the internet to try to figure out what is going on.  He does become increasingly sweaty during eval.     SYMPTOM BEHAVIOR:  Subjective history: Feels always "off".  He reports going in for CT scan for cardiology on 3/13 and when coming up from being in the machine, he felt dizzy, nauseous, and off balance.  He reports feeling  "off", especially when coming up from sitting or standing up.  He does report several falls/stumbles due to unsteadiness when getting out of bed.  No hx of migraines, but does report increased congestion in head and chest with headache due to a cold (flu and Covid were negative in MD office 05/01/2023)  Non-Vestibular symptoms: diplopia, headaches, and nausea/vomiting  Type of dizziness: Diplopia, Imbalance (Disequilibrium), Spinning/Vertigo, Unsteady with head/body turns, and "Funny feeling in the head"  Frequency: "always off"  Duration: lasts all day  Aggravating factors: Induced by position change: supine to sit and Induced by motion: turning body quickly, turning head quickly, and standing too long can't pick things up from the floor  Relieving factors: head stationary, closing eyes, slow movements, and avoid busy/distracting environments  Progression of symptoms: worse  OCULOMOTOR EXAM:  Holds head in R lateral tilt.  difficult looking up to either side due to headache pain  Ocular Alignment: normal  Ocular ROM:  difficulty vertical>horizontal due to headache pain.  Spontaneous Nystagmus: absent  Gaze-Induced Nystagmus: absent  Smooth Pursuits:  WNL--notes increased HA  Saccades:  poor accuracy all directions--reports forehead discomfort  Convergence/Divergence: 3 cm   VESTIBULAR - OCULAR REFLEX:   Slow VOR: horizontal-initial difficulty with horizontal with refixation, improved with trials; vertical-- no issue  VOR Cancellation: Comment: WNL--reports onset of dizziness  Head-Impulse Test: Difficulty maintaining eye contact of target at rest and with head thrust  Dynamic Visual Acuity: Not able to be assessed *No positive test of skew, HIT unable to assess fully   POSITIONAL TESTING: Right Dix-Hallpike: holds eyes closed, when he opens, note eyes fluttering in test position; no nystagmus noted; very nauseous upon return to sit; pt has double vision upon sitting up, with eyes converged  and takes 10-15 seconds to regain focus Left Dix-Hallpike: no nystagmus and eyes fluttering in test position (difficulty keeping eyes open); nausea/dizzy symptoms upon coming up to sit, with eyes converged/crosseyed, double vision, takes 10-15 seconds to focus Right Roll Test: no nystagmus and difficulty maintaining eyes open Left Roll Test: no nystagmus and difficulty maintaining eyes open; upon sit EOM from this position, pt has nausea and dry heaves ---see above in "today's treatment" for positional testing results                                                                                                                           TREATMENT DATE: 05/02/2023     GOALS: Goals reviewed with patient? Yes  SHORT TERM GOALS: = LTGs  LONG TERM GOALS: Target date: 06/02/2023  Pt will be independent with HEP for improved dizziness, balance. Baseline:  Goal status: IN PROGRESS  2.  Pt will report 0/10 dizziness with bed mobility. Baseline:  Goal status: INITIAL  3.  DHI score to improve by at least 18 points, to demo improved dizziness and balance for improved functional mobility. Baseline:  Goal status: INITIAL  4.  Dynamic Gait Index score to be assessed with pt to improve to at least 19/24 for decreased fall risk. Baseline: TBA Goal status: INITIAL  SSESSMENT:  CLINICAL IMPRESSION: Pt reports some improvements to baseline symptoms since initiating Prednisone rating symptoms at rest to be 3/10 with feeling of imbalance and blurred vision during focused activities being chief complaint.  Review of initial HEP with good tolerance and accuracy to vertical/horizontal saccadic eye movements.  Increased symptoms to VOR x 1 with slower, guarded movements.  Pt notes ongoing motion sensitivity to change of position.  Instructed in use of Brandt-Daroff exercise for habituation to motion sensitivity moving in a very slow, guarded fashion throughout and requiring 1 to 1.5 min in each position  for symptom resolution and notes increased intensity when left sidelying.  No nystagmus appreciated during positioning but he was noted to frequently keep eyes closed.  Education on timeline regarding vestibular rehabilitation and verbalizes understanding.  M-CTSIB for position 1 and 2  normal-mild with increased sway when on compliant surfaces with verbal cues to remain still in condition 4 with improved performance.  Continued sessions to progress POC details to improve mobility and reduce symptoms for improved function.  OBJECTIVE IMPAIRMENTS: Abnormal gait, decreased activity tolerance, decreased balance, decreased mobility, difficulty walking, dizziness, and pain.   ACTIVITY LIMITATIONS: bending, sitting, standing, sleeping, transfers, bed mobility, reach over head, and locomotion level  PARTICIPATION LIMITATIONS: meal prep, cleaning, laundry, community activity, and occupation  PERSONAL FACTORS: 3+ comorbidities: see above  are also affecting patient's functional outcome.   REHAB POTENTIAL: Good  CLINICAL DECISION MAKING: Evolving/moderate complexity  EVALUATION COMPLEXITY: Moderate   PLAN:  PT FREQUENCY: 1-2x/week  PT DURATION: 4 weeks  PLANNED INTERVENTIONS: 97110-Therapeutic exercises, 97530- Therapeutic activity, 97112- Neuromuscular re-education, 97535- Self Care, [id number]- Manual therapy, [phone]- Canalith repositioning, Patient/Family education, Balance training, and Vestibular training  PLAN FOR NEXT SESSION:  review and progress HEP, address motion sensitivity with habituation, progress to standing activities to incorporate balance testing   Dion Body, PT 05/15/2023, 10:15 AM   [facility] at [facility] [address] [place], [place] [address] Phone # [phone] Fax # [phone]

## 2023-05-17 ENCOUNTER — Encounter: Payer: Self-pay | Admitting: Physical Therapy

## 2023-05-17 ENCOUNTER — Ambulatory Visit: Attending: Internal Medicine | Admitting: Physical Therapy

## 2023-05-17 DIAGNOSIS — R42 Dizziness and giddiness: Secondary | ICD-10-CM | POA: Insufficient documentation

## 2023-05-17 DIAGNOSIS — R2681 Unsteadiness on feet: Secondary | ICD-10-CM | POA: Insufficient documentation

## 2023-05-17 NOTE — Therapy (Signed)
 OUTPATIENT PHYSICAL THERAPY VESTIBULAR TREATMENT     Patient Name: Jacob Williamson MRN: [id number] DOB:06/07/1970, 53 y.o., male Today's Date: 05/17/2023  END OF SESSION:  PT End of Session - 05/17/23 0849     Visit Number 5    Number of Visits 8    Date for PT Re-Evaluation 05/26/23    Authorization Type BCBS    PT Start Time 0850    PT Stop Time 0933    PT Time Calculation (min) 43 min    Activity Tolerance Patient tolerated treatment well    Behavior During Therapy [facility] for tasks assessed/performed               Past Medical History:  Diagnosis Date   Asthma    past hx - not current    Diverticulosis    sigmoid colon and descending colon   GERD (gastroesophageal reflux disease)    Hyperlipidemia    no medications    Obesity (BMI 35.0-39.9 without comorbidity)    Past Surgical History:  Procedure Laterality Date   COLONOSCOPY  05/20/2019   Patient Active Problem List   Diagnosis Date Noted   Primary hypertension 02/16/2023   Tendinitis of left triceps 05/05/2021   Acute right-sided thoracic back pain 12/19/2019   Vitamin D deficiency 05/07/2019   Vitamin B12 deficiency 05/07/2019   IGT (impaired glucose tolerance) 05/07/2019   Hyperlipidemia 05/07/2019   Obesity (BMI 35.0-39.9 without comorbidity)    GERD (gastroesophageal reflux disease)     PCP: Philip Aspen, Limmie Patricia, MD  REFERRING PROVIDER: Philip Aspen, Limmie Patricia, MD   REFERRING DIAG: H81.11 (ICD-10-CM) - BPPV (benign paroxysmal positional vertigo), right  THERAPY DIAG:  Dizziness and giddiness  Unsteadiness on feet  ONSET DATE: 05/01/2023  Rationale for Evaluation and Treatment: Rehabilitation  SUBJECTIVE:   SUBJECTIVE STATEMENT: I'm much better, less dizziness, more of a tired feeling.  The squatting all the way to the floor and getting up is better.  Getting up out of the bed is better, but still slow.   Have to be careful and slow with some of the turning activities at  work. Pt accompanied by: self  PERTINENT HISTORY: Jacob Williamson is a 52 y.o. male who is coming in today for the above mentioned reasons.  On the 13th he had a coronary CT scan.  As he was getting up from the CT scanner he had immediate onset of severe vertigo, nausea and balance disorder.  It is still present today although getting better.  He has been using Dramamine with some relief.  2 days ago he started experiencing extreme fatigue, diaphoresis, head cold symptoms with congestion, runny nose, cough and headache.   PMH asthma, GERD, HLD, obesity  PAIN:  Are you having pain? No  PRECAUTIONS: Fall  RED FLAGS: None   WEIGHT BEARING RESTRICTIONS: No  FALLS: Has patient fallen in last 6 months? Yes. Number of falls 2 In the past few days trying to get to bathroom  LIVING ENVIRONMENT: Lives with: lives with their spouse Has following equipment at home: None  PLOF: Independent and Vocation/Vocational requirements: standing and walking all day long  PATIENT GOALS: To feel right again  OBJECTIVE:   Rates symptoms 1-2/10 TODAY'S TREATMENT: 05/17/2023 Activity Comments  Review of Brandt-Daroff : to L 2/10, subsides in 30 sec 3-4/10 upon return to sit, resolves in 30 sec  Review of Brandt-Daroff to R 2-3/10 subsides in 30 sec, 3/10 return to sit  Standing L and R head  turns to targets, 5 reps 2/10 symptoms, pressure behind eyes  Worked on 180 degree turns in doorway, 3 reps, with UE support;  Modified to use method for 180 turns with head turn>body turn, 3 reps Min guard, narrowed BOS and retropulsion upon stopping  Mild symptoms and better balance  Attempted habituation with squat to touch cones, upon coming up-pt with strong retropulsion; Modified exercise for standing nose to knee, 3 reps each side Requires min assist for safety    Retropulsion upon coming up and guarded motions  Seated nose to knee, 3 reps 3-4/10 symptoms, cues to avoid excess posterior lean and to use  visual cue/target upon coming up to sit   HOME EXERCISE PROGRAM: Access Code: [email or web address] URL: [email or web address] Date: 05/17/2023 Prepared by: [facility] - Outpatient  Rehab - [facility]  Exercises - Seated Horizontal Saccades  - 1 x daily - 7 x weekly - 3 sets - 10 reps - Seated Vertical Saccades  - 1 x daily - 7 x weekly - 3 sets - 10 reps - Seated Gaze Stabilization with Head Rotation  - 1-2 x daily - 7 x weekly - 3 sets - 10 reps - Brandt-Daroff Vestibular Exercise  - 1 x daily - 7 x weekly - 1-2 sets - 5 reps - Standing with Head Rotation  - 1 x daily - 7 x weekly - 3 sets - 5 reps - Standing with Head Nod  - 1 x daily - 7 x weekly - 3 sets - 5 reps - 180 Degree Pivot Turn with Single Point Cane  - 1-2 x daily - 7 x weekly - 3 sets - 5 reps - Seated Nose to Left Knee Vestibular Habituation  - 1-2 x daily - 7 x weekly - 1-2 sets - 5 reps - Seated Nose to Right Knee Vestibular Habituation  - 1-2 x daily - 7 x weekly - 1-2 sets - 5 reps             PATIENT EDUCATION: Education details: Updates to HEP; continued to encourage physical activity and movement activities; explained again habituation of movement activities.   Person educated: Patient Education method: Explanation Education comprehension: verbalized understanding   ------------------------------------------- Note: Objective measures were completed at Evaluation unless otherwise noted.  VITALS (Baseline):  117/98  HR 95 bpm     COGNITION: Overall cognitive status: Within functional limits for tasks assessed   POSTURE:  Pt does hold head in slight lateral tilt to R  BED MOBILITY:  Very slowed and guarded  TRANSFERS: Assistive device utilized: None  Sit to stand: SBA Stand to sit: SBA  GAIT: Gait pattern:  slowed, guarded gait pattern, step through pattern, and wide BOS Distance walked: 25 ft Assistive device utilized: None Level of assistance: SBA Comments: slowed, guarded,  reached out for doorway, furniture   PATIENT SURVEYS:  DHI 82  VESTIBULAR ASSESSMENT:  GENERAL OBSERVATION: Pt appears anxious, reports he has been looking things up on the internet to try to figure out what is going on.  He does become increasingly sweaty during eval.     SYMPTOM BEHAVIOR:  Subjective history: Feels always "off".  He reports going in for CT scan for cardiology on 3/13 and when coming up from being in the machine, he felt dizzy, nauseous, and off balance.  He reports feeling "off", especially when coming up from sitting or standing up.  He does report several falls/stumbles due to unsteadiness when getting out of bed.  No hx of  migraines, but does report increased congestion in head and chest with headache due to a cold (flu and Covid were negative in MD office 05/01/2023)  Non-Vestibular symptoms: diplopia, headaches, and nausea/vomiting  Type of dizziness: Diplopia, Imbalance (Disequilibrium), Spinning/Vertigo, Unsteady with head/body turns, and "Funny feeling in the head"  Frequency: "always off"  Duration: lasts all day  Aggravating factors: Induced by position change: supine to sit and Induced by motion: turning body quickly, turning head quickly, and standing too long can't pick things up from the floor  Relieving factors: head stationary, closing eyes, slow movements, and avoid busy/distracting environments  Progression of symptoms: worse  OCULOMOTOR EXAM:  Holds head in R lateral tilt.  difficult looking up to either side due to headache pain  Ocular Alignment: normal  Ocular ROM:  difficulty vertical>horizontal due to headache pain.  Spontaneous Nystagmus: absent  Gaze-Induced Nystagmus: absent  Smooth Pursuits:  WNL--notes increased HA  Saccades:  poor accuracy all directions--reports forehead discomfort  Convergence/Divergence: 3 cm   VESTIBULAR - OCULAR REFLEX:   Slow VOR: horizontal-initial difficulty with horizontal with refixation, improved with trials;  vertical-- no issue  VOR Cancellation: Comment: WNL--reports onset of dizziness  Head-Impulse Test: Difficulty maintaining eye contact of target at rest and with head thrust  Dynamic Visual Acuity: Not able to be assessed *No positive test of skew, HIT unable to assess fully   POSITIONAL TESTING: Right Dix-Hallpike: holds eyes closed, when he opens, note eyes fluttering in test position; no nystagmus noted; very nauseous upon return to sit; pt has double vision upon sitting up, with eyes converged and takes 10-15 seconds to regain focus Left Dix-Hallpike: no nystagmus and eyes fluttering in test position (difficulty keeping eyes open); nausea/dizzy symptoms upon coming up to sit, with eyes converged/crosseyed, double vision, takes 10-15 seconds to focus Right Roll Test: no nystagmus and difficulty maintaining eyes open Left Roll Test: no nystagmus and difficulty maintaining eyes open; upon sit EOM from this position, pt has nausea and dry heaves ---see above in "today's treatment" for positional testing results                                                                                                                           TREATMENT DATE: 05/02/2023     GOALS: Goals reviewed with patient? Yes  SHORT TERM GOALS: = LTGs  LONG TERM GOALS: Target date: 06/02/2023  Pt will be independent with HEP for improved dizziness, balance. Baseline:  Goal status: IN PROGRESS  2.  Pt will report 0/10 dizziness with bed mobility. Baseline:  Goal status: INITIAL  3.  DHI score to improve by at least 18 points, to demo improved dizziness and balance for improved functional mobility. Baseline:  Goal status: INITIAL  4.  Dynamic Gait Index score to be assessed with pt to improve to at least 19/24 for decreased fall risk. Baseline: 14/24 Goal status: INITIAL  SSESSMENT:  CLINICAL IMPRESSION: Pt presents today with continued reports that symptoms  are improving, and reports that he is  figuring out how to move to avoid bringing on symptoms. Skilled PT session focused on progression of exercises, as Brandt-Daroff and seated head motions bring on very low symptoms compared to first time.  Also continued to educate patient on purpose of habituation of movement patterns and trying to move as normally as possible, when safe, to avoid excess guarding and compensations for movements.  Worked today to address things that pt is reporting difficulty with at work-turns,picking up things from floor; when large and fast movement patterns, he demo retropulsion and very unsteady/narrow BOS; with cues to modify these exercises, he is much safer and the movements still bring on symptoms appropriately.  Pt will continue to benefit from skilled PT towards goals for improved functional mobility and decreased fall risk.    OBJECTIVE IMPAIRMENTS: Abnormal gait, decreased activity tolerance, decreased balance, decreased mobility, difficulty walking, dizziness, and pain.   ACTIVITY LIMITATIONS: bending, sitting, standing, sleeping, transfers, bed mobility, reach over head, and locomotion level  PARTICIPATION LIMITATIONS: meal prep, cleaning, laundry, community activity, and occupation  PERSONAL FACTORS: 3+ comorbidities: see above  are also affecting patient's functional outcome.   REHAB POTENTIAL: Good  CLINICAL DECISION MAKING: Evolving/moderate complexity  EVALUATION COMPLEXITY: Moderate   PLAN:  PT FREQUENCY: 1-2x/week  PT DURATION: 4 weeks  PLANNED INTERVENTIONS: 97110-Therapeutic exercises, 97530- Therapeutic activity, 97112- Neuromuscular re-education, 97535- Self Care, [id number]- Manual therapy, [phone]- Canalith repositioning, Patient/Family education, Balance training, and Vestibular training  PLAN FOR NEXT SESSION:  review and progress HEP, address motion sensitivity with habituation, progress to standing activities to incorporate balance    Katiejo Gilroy W., PT 05/17/2023, 12:05 PM    [facility] at [facility] [address] [place], [place] [address] Phone # [phone] Fax # [phone]

## 2023-05-23 ENCOUNTER — Encounter: Payer: Self-pay | Admitting: *Deleted

## 2023-05-24 ENCOUNTER — Ambulatory Visit

## 2023-05-24 DIAGNOSIS — R2681 Unsteadiness on feet: Secondary | ICD-10-CM | POA: Diagnosis not present

## 2023-05-24 DIAGNOSIS — R42 Dizziness and giddiness: Secondary | ICD-10-CM | POA: Diagnosis not present

## 2023-05-24 NOTE — Therapy (Signed)
 OUTPATIENT PHYSICAL THERAPY VESTIBULAR TREATMENT     Patient Name: Jacob Williamson MRN: [id number] DOB:1970/05/31, 53 y.o., male Today's Date: 05/24/2023  END OF SESSION:  PT End of Session - 05/24/23 0852     Visit Number 6    Number of Visits 8    Date for PT Re-Evaluation 05/26/23    Authorization Type [employer]    PT Start Time [id number]    PT Stop Time 0930    PT Time Calculation (min) 40 min    Activity Tolerance Patient tolerated treatment well    Behavior During Therapy [facility] for tasks assessed/performed               Past Medical History:  Diagnosis Date   Asthma    past hx - not current    Diverticulosis    sigmoid colon and descending colon   GERD (gastroesophageal reflux disease)    Hyperlipidemia    no medications    Obesity (BMI 35.0-39.9 without comorbidity)    Past Surgical History:  Procedure Laterality Date   COLONOSCOPY  05/20/2019   Patient Active Problem List   Diagnosis Date Noted   Primary hypertension 02/16/2023   Tendinitis of left triceps 05/05/2021   Acute right-sided thoracic back pain 12/19/2019   Vitamin D deficiency 05/07/2019   Vitamin B12 deficiency 05/07/2019   IGT (impaired glucose tolerance) 05/07/2019   Hyperlipidemia 05/07/2019   Obesity (BMI 35.0-39.9 without comorbidity)    GERD (gastroesophageal reflux disease)     PCP: Philip Aspen, Limmie Patricia, MD  REFERRING PROVIDER: Philip Aspen, Limmie Patricia, MD   REFERRING DIAG: H81.11 (ICD-10-CM) - BPPV (benign paroxysmal positional vertigo), right  THERAPY DIAG:  Dizziness and giddiness  Unsteadiness on feet  ONSET DATE: 05/01/2023  Rationale for Evaluation and Treatment: Rehabilitation  SUBJECTIVE:   SUBJECTIVE STATEMENT: No symptoms over past couple of days. Unsure of percentage improvement, but no episodes as of late.  I think the prednisone helped.  Haven't tried to do anything overly strenuous such as jogging short distances.  Have been able to bend over a little  easier, still limited in squatting too low or laying flat but able to arise quicker without provocation Pt accompanied by: self  PERTINENT HISTORY: Jacob Williamson is a 53 y.o. male who is coming in today for the above mentioned reasons.  On the 13th he had a coronary CT scan.  As he was getting up from the CT scanner he had immediate onset of severe vertigo, nausea and balance disorder.  It is still present today although getting better.  He has been using Dramamine with some relief.  2 days ago he started experiencing extreme fatigue, diaphoresis, head cold symptoms with congestion, runny nose, cough and headache.   PMH asthma, GERD, HLD, obesity  PAIN:  Are you having pain? No  PRECAUTIONS: Fall  RED FLAGS: None   WEIGHT BEARING RESTRICTIONS: No  FALLS: Has patient fallen in last 6 months? Yes. Number of falls 2 In the past few days trying to get to bathroom  LIVING ENVIRONMENT: Lives with: lives with their spouse Has following equipment at home: None  PLOF: Independent and Vocation/Vocational requirements: standing and walking all Jacob long  PATIENT GOALS: To feel right again  OBJECTIVE:   TODAY'S TREATMENT: 05/24/23 Activity Comments  Symptoms baseline: 0/10   Forward-T at counter Visual fixation--notes lag for gaze stability  Standing gaze stabilization x 30 sec VOR x 1, no issues  Walking VOR x 1 No issues, very  slight symptoms  Standing VOR x 2 No issues, 1/10 symptoms  Brandt-Daroff x 3 reps Improved speed, no symptoms  Squat and reach 0/10 symptoms  Bean bag catch and turn to table Required multiple instances of squatting to pick up items from ground--no symptoms     Rates symptoms 1-2/10 TODAY'S TREATMENT: 05/17/2023 Activity Comments  Review of Brandt-Daroff : to L 2/10, subsides in 30 sec 3-4/10 upon return to sit, resolves in 30 sec  Review of Brandt-Daroff to R 2-3/10 subsides in 30 sec, 3/10 return to sit  Standing L and R head turns to targets, 5 reps  2/10 symptoms, pressure behind eyes  Worked on 180 degree turns in doorway, 3 reps, with UE support;  Modified to use method for 180 turns with head turn>body turn, 3 reps Min guard, narrowed BOS and retropulsion upon stopping  Mild symptoms and better balance  Attempted habituation with squat to touch cones, upon coming up-pt with strong retropulsion; Modified exercise for standing nose to knee, 3 reps each side Requires min assist for safety    Retropulsion upon coming up and guarded motions  Seated nose to knee, 3 reps 3-4/10 symptoms, cues to avoid excess posterior lean and to use visual cue/target upon coming up to sit   HOME EXERCISE PROGRAM: Access Code: [email or web address] URL: [email or web address] Date: 05/17/2023 Prepared by: [facility] - Outpatient  Rehab - [facility]  Exercises - Seated Horizontal Saccades  - 1 x daily - 7 x weekly - 3 sets - 10 reps - Seated Vertical Saccades  - 1 x daily - 7 x weekly - 3 sets - 10 reps - Seated Gaze Stabilization with Head Rotation  - 1-2 x daily - 7 x weekly - 3 sets - 10 reps - Brandt-Daroff Vestibular Exercise  - 1 x daily - 7 x weekly - 1-2 sets - 5 reps - Standing with Head Rotation  - 1 x daily - 7 x weekly - 3 sets - 5 reps - Standing with Head Nod  - 1 x daily - 7 x weekly - 3 sets - 5 reps - 180 Degree Pivot Turn with Single Point Cane  - 1-2 x daily - 7 x weekly - 3 sets - 5 reps - Seated Nose to Left Knee Vestibular Habituation  - 1-2 x daily - 7 x weekly - 1-2 sets - 5 reps - Seated Nose to Right Knee Vestibular Habituation  - 1-2 x daily - 7 x weekly - 1-2 sets - 5 reps - Walking Gaze Stabilization Head Rotation  - 1 x daily - 7 x weekly - 3-5 sets - 15-30 sec hold - Gaze Stability (VOR) x 2 Feet Together With Horizontal Head Turns  - 1 x daily - 7 x weekly - 3-5 sets - 15-30 sec hold             PATIENT EDUCATION: Education details: Updates to HEP; continued to encourage physical activity and movement  activities; explained again habituation of movement activities.   Person educated: Patient Education method: Explanation Education comprehension: verbalized understanding   ------------------------------------------- Note: Objective measures were completed at Evaluation unless otherwise noted.  VITALS (Baseline):  117/98  HR 95 bpm     COGNITION: Overall cognitive status: Within functional limits for tasks assessed   POSTURE:  Pt does hold head in slight lateral tilt to R  BED MOBILITY:  Very slowed and guarded  TRANSFERS: Assistive device utilized: None  Sit to stand: SBA Stand to sit: SBA  GAIT: Gait pattern:  slowed, guarded gait pattern, step through pattern, and wide BOS Distance walked: 25 ft Assistive device utilized: None Level of assistance: SBA Comments: slowed, guarded, reached out for doorway, furniture   PATIENT SURVEYS:  DHI 82  VESTIBULAR ASSESSMENT:  GENERAL OBSERVATION: Pt appears anxious, reports he has been looking things up on the internet to try to figure out what is going on.  He does become increasingly sweaty during eval.     SYMPTOM BEHAVIOR:  Subjective history: Feels always "off".  He reports going in for CT scan for cardiology on 3/13 and when coming up from being in the machine, he felt dizzy, nauseous, and off balance.  He reports feeling "off", especially when coming up from sitting or standing up.  He does report several falls/stumbles due to unsteadiness when getting out of bed.  No hx of migraines, but does report increased congestion in head and chest with headache due to a cold (flu and Covid were negative in MD office 05/01/2023)  Non-Vestibular symptoms: diplopia, headaches, and nausea/vomiting  Type of dizziness: Diplopia, Imbalance (Disequilibrium), Spinning/Vertigo, Unsteady with head/body turns, and "Funny feeling in the head"  Frequency: "always off"  Duration: lasts all Jacob  Aggravating factors: Induced by position change:  supine to sit and Induced by motion: turning body quickly, turning head quickly, and standing too long can't pick things up from the floor  Relieving factors: head stationary, closing eyes, slow movements, and avoid busy/distracting environments  Progression of symptoms: worse  OCULOMOTOR EXAM:  Holds head in R lateral tilt.  difficult looking up to either side due to headache pain  Ocular Alignment: normal  Ocular ROM:  difficulty vertical>horizontal due to headache pain.  Spontaneous Nystagmus: absent  Gaze-Induced Nystagmus: absent  Smooth Pursuits:  WNL--notes increased HA  Saccades:  poor accuracy all directions--reports forehead discomfort  Convergence/Divergence: 3 cm   VESTIBULAR - OCULAR REFLEX:   Slow VOR: horizontal-initial difficulty with horizontal with refixation, improved with trials; vertical-- no issue  VOR Cancellation: Comment: WNL--reports onset of dizziness  Head-Impulse Test: Difficulty maintaining eye contact of target at rest and with head thrust  Dynamic Visual Acuity: Not able to be assessed *No positive test of skew, HIT unable to assess fully   POSITIONAL TESTING: Right Dix-Hallpike: holds eyes closed, when he opens, note eyes fluttering in test position; no nystagmus noted; very nauseous upon return to sit; pt has double vision upon sitting up, with eyes converged and takes 10-15 seconds to regain focus Left Dix-Hallpike: no nystagmus and eyes fluttering in test position (difficulty keeping eyes open); nausea/dizzy symptoms upon coming up to sit, with eyes converged/crosseyed, double vision, takes 10-15 seconds to focus Right Roll Test: no nystagmus and difficulty maintaining eyes open Left Roll Test: no nystagmus and difficulty maintaining eyes open; upon sit EOM from this position, pt has nausea and dry heaves ---see above in "today's treatment" for positional testing results  TREATMENT DATE: 05/02/2023     GOALS: Goals reviewed with patient? Yes  SHORT TERM GOALS: = LTGs  LONG TERM GOALS: Target date: 06/02/2023  Pt will be independent with HEP for improved dizziness, balance. Baseline:  Goal status: IN PROGRESS  2.  Pt will report 0/10 dizziness with bed mobility. Baseline:  Goal status: INITIAL  3.  DHI score to improve by at least 18 points, to demo improved dizziness and balance for improved functional mobility. Baseline:  Goal status: INITIAL  4.  Dynamic Gait Index score to be assessed with pt to improve to at least 19/24 for decreased fall risk. Baseline: 14/24 Goal status: INITIAL  SSESSMENT:  CLINICAL IMPRESSION: Pt presents today with continued reports that symptoms are improving with minimal to no issues over past couple of days.  Review of HEP without significant provocation and able to move with increased speed/amplitude without issue.  Proceeded with more complex and challenging movements to meet his work environment demands without issue for forward-T at counter or squatting to pick up items from ground.  Able to perform quicker turns of head/body and reactive balance. Progressed VOR x 1 to walking and standing VOR x 2 without significant issues.  Notes overall improvement and tolerating increased activity without detriment. IF continued improvement anticipate D/C  OBJECTIVE IMPAIRMENTS: Abnormal gait, decreased activity tolerance, decreased balance, decreased mobility, difficulty walking, dizziness, and pain.   ACTIVITY LIMITATIONS: bending, sitting, standing, sleeping, transfers, bed mobility, reach over head, and locomotion level  PARTICIPATION LIMITATIONS: meal prep, cleaning, laundry, community activity, and occupation  PERSONAL FACTORS: 3+ comorbidities: see above  are also affecting patient's functional outcome.   REHAB POTENTIAL: Good  CLINICAL DECISION MAKING: Evolving/moderate  complexity  EVALUATION COMPLEXITY: Moderate   PLAN:  PT FREQUENCY: 1-2x/week  PT DURATION: 4 weeks  PLANNED INTERVENTIONS: 97110-Therapeutic exercises, 97530- Therapeutic activity, 97112- Neuromuscular re-education, 97535- Self Care, 45409- Manual therapy, [phone]- Canalith repositioning, Patient/Family education, Balance training, and Vestibular training  PLAN FOR NEXT SESSION: Re-assessment, ready for D/C   Dion Body, PT 05/24/2023, 8:53 AM   [facility] at [facility] [address], [address] [place], [place] [address] Phone # [phone] Fax # [phone]

## 2023-05-26 ENCOUNTER — Encounter: Payer: Self-pay | Admitting: Physical Therapy

## 2023-05-26 ENCOUNTER — Ambulatory Visit: Admitting: Physical Therapy

## 2023-05-26 DIAGNOSIS — R42 Dizziness and giddiness: Secondary | ICD-10-CM | POA: Diagnosis not present

## 2023-05-26 DIAGNOSIS — R2681 Unsteadiness on feet: Secondary | ICD-10-CM | POA: Diagnosis not present

## 2023-05-26 NOTE — Therapy (Signed)
 OUTPATIENT PHYSICAL THERAPY VESTIBULAR TREATMENT/DISCHARGE SUMMARY     Patient Name: Jacob Williamson MRN: [id number] DOB:1970/11/24, 53 y.o., male Today's Date: 05/26/2023  END OF SESSION:  PT End of Session - 05/26/23 0854     Visit Number 7    Number of Visits 8    Date for PT Re-Evaluation 05/26/23    Authorization Type BCBS    PT Start Time 0855   Pt arrives late   PT Stop Time 0922    PT Time Calculation (min) 27 min    Activity Tolerance Patient tolerated treatment well    Behavior During Therapy [facility] for tasks assessed/performed                Past Medical History:  Diagnosis Date   Asthma    past hx - not current    Diverticulosis    sigmoid colon and descending colon   GERD (gastroesophageal reflux disease)    Hyperlipidemia    no medications    Obesity (BMI 35.0-39.9 without comorbidity)    Past Surgical History:  Procedure Laterality Date   COLONOSCOPY  05/20/2019   Patient Active Problem List   Diagnosis Date Noted   Primary hypertension 02/16/2023   Tendinitis of left triceps 05/05/2021   Acute right-sided thoracic back pain 12/19/2019   Vitamin D deficiency 05/07/2019   Vitamin B12 deficiency 05/07/2019   IGT (impaired glucose tolerance) 05/07/2019   Hyperlipidemia 05/07/2019   Obesity (BMI 35.0-39.9 without comorbidity)    GERD (gastroesophageal reflux disease)     PCP: Jacob Williamson, Jacob Patricia, MD  REFERRING PROVIDER: Philip Williamson, Jacob Patricia, MD   REFERRING DIAG: H81.11 (ICD-10-CM) - BPPV (benign paroxysmal positional vertigo), right  THERAPY DIAG:  Dizziness and giddiness  Unsteadiness on feet  ONSET DATE: 05/01/2023  Rationale for Evaluation and Treatment: Rehabilitation  SUBJECTIVE:   SUBJECTIVE STATEMENT: Reports continuing to do better.  About 90% back to normal.  Have not had any dizziness spells. Pt accompanied by: self  PERTINENT HISTORY: Jacob Williamson is a 53 y.o. male who is coming in today for the  above mentioned reasons.  On the 13th he had a coronary CT scan.  As he was getting up from the CT scanner he had immediate onset of severe vertigo, nausea and balance disorder.  It is still present today although getting better.  He has been using Dramamine with some relief.  2 days ago he started experiencing extreme fatigue, diaphoresis, head cold symptoms with congestion, runny nose, cough and headache.   PMH asthma, GERD, HLD, obesity  PAIN:  Are you having pain? No  PRECAUTIONS: Fall  RED FLAGS: None   WEIGHT BEARING RESTRICTIONS: No  FALLS: Has patient fallen in last 6 months? Yes. Number of falls 2 In the past few days trying to get to bathroom  LIVING ENVIRONMENT: Lives with: lives with their spouse Has following equipment at home: None  PLOF: Independent and Vocation/Vocational requirements: standing and walking all day long  PATIENT GOALS: To feel right again  OBJECTIVE:    TODAY'S TREATMENT: 05/26/2023 Activity Comments  DGI 24/24 Improved from 13/24  FGA 27/30   DHI:  8 Improved from 82             [facility] PT Assessment - 05/26/23 0001       Standardized Balance Assessment   Standardized Balance Assessment Dynamic Gait Index      Dynamic Gait Index   Level Surface Normal    Change in Gait Speed  Normal    Gait with Horizontal Head Turns Normal    Gait with Vertical Head Turns Normal    Gait and Pivot Turn Normal    Step Over Obstacle Normal    Step Around Obstacles Normal    Steps Normal    Total Score 24    DGI comment: Improved from 13/24      Functional Gait  Assessment   Gait assessed  Yes    Gait Level Surface Walks 20 ft in less than 7 sec but greater than 5.5 sec, uses assistive device, slower speed, mild gait deviations, or deviates 6-10 in outside of the 12 in walkway width.   6 sec   Change in Gait Speed Able to smoothly change walking speed without loss of balance or gait deviation. Deviate no more than 6 in outside of the 12 in walkway  width.    Gait with Horizontal Head Turns Performs head turns smoothly with no change in gait. Deviates no more than 6 in outside 12 in walkway width    Gait with Vertical Head Turns Performs head turns with no change in gait. Deviates no more than 6 in outside 12 in walkway width.    Gait and Pivot Turn Pivot turns safely within 3 sec and stops quickly with no loss of balance.    Step Over Obstacle Is able to step over 2 stacked shoe boxes taped together (9 in total height) without changing gait speed. No evidence of imbalance.    Gait with Narrow Base of Support Is able to ambulate for 10 steps heel to toe with no staggering.    Gait with Eyes Closed Walks 20 ft, slow speed, abnormal gait pattern, evidence for imbalance, deviates 10-15 in outside 12 in walkway width. Requires more than 9 sec to ambulate 20 ft.    Ambulating Backwards Walks 20 ft, no assistive devices, good speed, no evidence for imbalance, normal gait    Steps Alternating feet, no rail.    Total Score 27             HOME EXERCISE PROGRAM: Access Code: [email or web address] URL: [email or web address] Date: 05/17/2023 Prepared by: [facility] Outpatient  Rehab - [facility]  Exercises - Seated Horizontal Saccades  - 1 x daily - 7 x weekly - 3 sets - 10 reps - Seated Vertical Saccades  - 1 x daily - 7 x weekly - 3 sets - 10 reps - Seated Gaze Stabilization with Head Rotation  - 1-2 x daily - 7 x weekly - 3 sets - 10 reps - Brandt-Daroff Vestibular Exercise  - 1 x daily - 7 x weekly - 1-2 sets - 5 reps - Standing with Head Rotation  - 1 x daily - 7 x weekly - 3 sets - 5 reps - Standing with Head Nod  - 1 x daily - 7 x weekly - 3 sets - 5 reps - 180 Degree Pivot Turn with Single Point Cane  - 1-2 x daily - 7 x weekly - 3 sets - 5 reps - Seated Nose to Left Knee Vestibular Habituation  - 1-2 x daily - 7 x weekly - 1-2 sets - 5 reps - Seated Nose to Right Knee Vestibular Habituation  - 1-2 x daily - 7 x weekly - 1-2  sets - 5 reps - Walking Gaze Stabilization Head Rotation  - 1 x daily - 7 x weekly - 3-5 sets - 15-30 sec hold - Gaze Stability (VOR) x 2 Feet Together  With Horizontal Head Turns  - 1 x daily - 7 x weekly - 3-5 sets - 15-30 sec hold          [facility] PT Assessment - 05/26/23 0001       Standardized Balance Assessment   Standardized Balance Assessment Dynamic Gait Index      Dynamic Gait Index   Level Surface Normal    Change in Gait Speed Normal    Gait with Horizontal Head Turns Normal    Gait with Vertical Head Turns Normal    Gait and Pivot Turn Normal    Step Over Obstacle Normal    Step Around Obstacles Normal    Steps Normal    Total Score 24    DGI comment: Improved from 13/24      Functional Gait  Assessment   Gait assessed  Yes    Gait Level Surface Walks 20 ft in less than 7 sec but greater than 5.5 sec, uses assistive device, slower speed, mild gait deviations, or deviates 6-10 in outside of the 12 in walkway width.   6 sec   Change in Gait Speed Able to smoothly change walking speed without loss of balance or gait deviation. Deviate no more than 6 in outside of the 12 in walkway width.    Gait with Horizontal Head Turns Performs head turns smoothly with no change in gait. Deviates no more than 6 in outside 12 in walkway width    Gait with Vertical Head Turns Performs head turns with no change in gait. Deviates no more than 6 in outside 12 in walkway width.    Gait and Pivot Turn Pivot turns safely within 3 sec and stops quickly with no loss of balance.    Step Over Obstacle Is able to step over 2 stacked shoe boxes taped together (9 in total height) without changing gait speed. No evidence of imbalance.    Gait with Narrow Base of Support Is able to ambulate for 10 steps heel to toe with no staggering.    Gait with Eyes Closed Walks 20 ft, slow speed, abnormal gait pattern, evidence for imbalance, deviates 10-15 in outside 12 in walkway width. Requires more than 9 sec to  ambulate 20 ft.    Ambulating Backwards Walks 20 ft, no assistive devices, good speed, no evidence for imbalance, normal gait    Steps Alternating feet, no rail.    Total Score 27               PATIENT EDUCATION: Education details: Progress towards goals and plans for discharge   Person educated: Patient Education method: Explanation Education comprehension: verbalized understanding   ------------------------------------------- Note: Objective measures were completed at Evaluation unless otherwise noted.  VITALS (Baseline):  117/98  HR 95 bpm     COGNITION: Overall cognitive status: Within functional limits for tasks assessed   POSTURE:  Pt does hold head in slight lateral tilt to R  BED MOBILITY:  Very slowed and guarded  TRANSFERS: Assistive device utilized: None  Sit to stand: SBA Stand to sit: SBA  GAIT: Gait pattern:  slowed, guarded gait pattern, step through pattern, and wide BOS Distance walked: 25 ft Assistive device utilized: None Level of assistance: SBA Comments: slowed, guarded, reached out for doorway, furniture   PATIENT SURVEYS:  DHI 82  VESTIBULAR ASSESSMENT:  GENERAL OBSERVATION: Pt appears anxious, reports he has been looking things up on the internet to try to figure out what is going on.  He does  become increasingly sweaty during eval.     SYMPTOM BEHAVIOR:  Subjective history: Feels always "off".  He reports going in for CT scan for cardiology on 3/13 and when coming up from being in the machine, he felt dizzy, nauseous, and off balance.  He reports feeling "off", especially when coming up from sitting or standing up.  He does report several falls/stumbles due to unsteadiness when getting out of bed.  No hx of migraines, but does report increased congestion in head and chest with headache due to a cold (flu and Covid were negative in MD office 05/01/2023)  Non-Vestibular symptoms: diplopia, headaches, and nausea/vomiting  Type of  dizziness: Diplopia, Imbalance (Disequilibrium), Spinning/Vertigo, Unsteady with head/body turns, and "Funny feeling in the head"  Frequency: "always off"  Duration: lasts all day  Aggravating factors: Induced by position change: supine to sit and Induced by motion: turning body quickly, turning head quickly, and standing too long can't pick things up from the floor  Relieving factors: head stationary, closing eyes, slow movements, and avoid busy/distracting environments  Progression of symptoms: worse  OCULOMOTOR EXAM:  Holds head in R lateral tilt.  difficult looking up to either side due to headache pain  Ocular Alignment: normal  Ocular ROM:  difficulty vertical>horizontal due to headache pain.  Spontaneous Nystagmus: absent  Gaze-Induced Nystagmus: absent  Smooth Pursuits:  WNL--notes increased HA  Saccades:  poor accuracy all directions--reports forehead discomfort  Convergence/Divergence: 3 cm   VESTIBULAR - OCULAR REFLEX:   Slow VOR: horizontal-initial difficulty with horizontal with refixation, improved with trials; vertical-- no issue  VOR Cancellation: Comment: WNL--reports onset of dizziness  Head-Impulse Test: Difficulty maintaining eye contact of target at rest and with head thrust  Dynamic Visual Acuity: Not able to be assessed *No positive test of skew, HIT unable to assess fully   POSITIONAL TESTING: Right Dix-Hallpike: holds eyes closed, when he opens, note eyes fluttering in test position; no nystagmus noted; very nauseous upon return to sit; pt has double vision upon sitting up, with eyes converged and takes 10-15 seconds to regain focus Left Dix-Hallpike: no nystagmus and eyes fluttering in test position (difficulty keeping eyes open); nausea/dizzy symptoms upon coming up to sit, with eyes converged/crosseyed, double vision, takes 10-15 seconds to focus Right Roll Test: no nystagmus and difficulty maintaining eyes open Left Roll Test: no nystagmus and difficulty  maintaining eyes open; upon sit EOM from this position, pt has nausea and dry heaves ---see above in "today's treatment" for positional testing results                                                                                                                           TREATMENT DATE: 05/02/2023     GOALS: Goals reviewed with patient? Yes  SHORT TERM GOALS: = LTGs  LONG TERM GOALS: Target date: 06/02/2023  Pt will be independent with HEP for improved dizziness, balance. Baseline:  Goal status: MET  2.  Pt will report  0/10 dizziness with bed mobility. Baseline: No c/o dizziness Goal status: MET 05/26/2023  3.  DHI score to improve by at least 18 points, to demo improved dizziness and balance for improved functional mobility. Baseline: Initial score 82; today's score 8, 05/26/2023 Goal status: MET 05/26/2023  4.  Dynamic Gait Index score to be assessed with pt to improve to at least 19/24 for decreased fall risk. Baseline: 14/24>24/24 Goal status: MET 05/26/2023  SSESSMENT:  CLINICAL IMPRESSION: Pt presents today reporting continued improvement, feeling like he is 90% of his normal. Skilled PT session focused on assessing LTGs.  He has met 4 of 4 LTGs.  He is not experiencing any symptoms and he is at low fall risk per DGI score of 24/24 and FGA score of 27/30.  He is appropriate for discharge at this time. *did discuss holding chart open for 30 days in case he has any return of symptoms; if he doesn't return, we will discharge at that time*  OBJECTIVE IMPAIRMENTS: Abnormal gait, decreased activity tolerance, decreased balance, decreased mobility, difficulty walking, dizziness, and pain.   ACTIVITY LIMITATIONS: bending, sitting, standing, sleeping, transfers, bed mobility, reach over head, and locomotion level  PARTICIPATION LIMITATIONS: meal prep, cleaning, laundry, community activity, and occupation  PERSONAL FACTORS: 3+ comorbidities: see above  are also affecting patient's  functional outcome.   REHAB POTENTIAL: Good  CLINICAL DECISION MAKING: Evolving/moderate complexity  EVALUATION COMPLEXITY: Moderate   PLAN:  PT FREQUENCY: 1-2x/week  PT DURATION: 4 weeks  PLANNED INTERVENTIONS: 97110-Therapeutic exercises, 97530- Therapeutic activity, 97112- Neuromuscular re-education, 97535- Self Care, 16109- Manual therapy, [phone]- Canalith repositioning, Patient/Family education, Balance training, and Vestibular training  PLAN FOR NEXT SESSION: Hold chart open for 30 days; if haven't heard from pt by that time, plan to complete discharge   Sidda Humm W., PT 05/26/2023, 9:23 AM   [facility] at [facility] [address] [place], [place] [address] Phone # [phone] Fax # [phone]

## 2023-05-30 ENCOUNTER — Ambulatory Visit

## 2023-06-05 ENCOUNTER — Ambulatory Visit

## 2023-06-06 ENCOUNTER — Ambulatory Visit: Payer: BC Managed Care – PPO | Admitting: Internal Medicine

## 2023-06-06 ENCOUNTER — Other Ambulatory Visit

## 2023-06-06 LAB — VITAMIN D 25 HYDROXY (VIT D DEFICIENCY, FRACTURES): VITD: 15.89 ng/mL — ABNORMAL LOW (ref 30.00–100.00)

## 2023-06-06 LAB — HEMOGLOBIN A1C: Hgb A1c MFr Bld: 6.3 % (ref 4.6–6.5)

## 2023-06-07 ENCOUNTER — Encounter: Payer: Self-pay | Admitting: Internal Medicine

## 2023-06-07 ENCOUNTER — Other Ambulatory Visit: Payer: Self-pay | Admitting: Internal Medicine

## 2023-06-07 DIAGNOSIS — E559 Vitamin D deficiency, unspecified: Secondary | ICD-10-CM

## 2023-06-07 MED ORDER — VITAMIN D (ERGOCALCIFEROL) 1.25 MG (50000 UNIT) PO CAPS: 50000.0000 [IU] | ORAL_CAPSULE | ORAL | 0 refills | Status: DC

## 2023-06-12 ENCOUNTER — Ambulatory Visit (INDEPENDENT_AMBULATORY_CARE_PROVIDER_SITE_OTHER)

## 2023-06-12 DIAGNOSIS — E538 Deficiency of other specified B group vitamins: Secondary | ICD-10-CM

## 2023-06-12 MED ORDER — CYANOCOBALAMIN 1000 MCG/ML IJ SOLN
1000.0000 ug | Freq: Once | INTRAMUSCULAR | Status: AC
Start: 2023-06-12 — End: 2023-06-12
  Administered 2023-06-12: 1000 ug via INTRAMUSCULAR

## 2023-06-12 NOTE — Progress Notes (Signed)
 Patient is in office today for a nurse visit for B12 Injection. Patient Injection was given in the  Right deltoid. Patient tolerated injection well.

## 2023-08-15 ENCOUNTER — Ambulatory Visit: Payer: Self-pay

## 2023-08-15 NOTE — Telephone Encounter (Signed)
 Copied from CRM [id number]. Topic: Clinical - Red Word Triage >> Aug 15, 2023  3:10 PM [place] M wrote: Red Word that prompted transfer to Nurse Triage: High fever last night, chills, diarrhea and vomiting. started over the weekend     FYI Only or Action Required?: FYI only for provider.  Patient was last seen in primary care on 05/08/2023 by Theophilus Andrews, Tully GRADE, MD. Called Nurse Triage reporting Vomiting. Symptoms began yesterday. Interventions attempted: Rest, hydration, or home remedies. Symptoms are: unchanged.  Triage Disposition: See Physician Within 24 Hours  Patient/caregiver understands and will follow disposition?: Yes     Reason for Disposition  [1] MILD or MODERATE vomiting AND [2] present > 48 hours (2 days) (Exception: Mild vomiting with associated diarrhea.)  Answer Assessment - Initial Assessment Questions 1. VOMITING SEVERITY: How many times have you vomited in the past 24 hours?     - MILD:  1 - 2 times/day    - MODERATE: 3 - 5 times/day, decreased oral intake without significant weight loss or symptoms of dehydration    - SEVERE: 6 or more times/day, vomits everything or nearly everything, with significant weight loss, symptoms of dehydration      6 in the last 24 hours  2. ONSET: When did the vomiting begin?      Yesterday  3. FLUIDS: What fluids or food have you vomited up today? Have you been able to keep any fluids down?     Keeping some fluids down  4. ABDOMEN PAIN: Are your having any abdomen pain? If Yes : How bad is it and what does it feel like? (e.g., crampy, dull, intermittent, constant)      Some pain in certain positions  5. DIARRHEA: Is there any diarrhea? If Yes, ask: How many times today?      Yes, for 2 weeks 6. CONTACTS: Is there anyone else in the family with the same symptoms?      Kids are also sick  7. CAUSE: What do you think is causing your vomiting?     Unsure  8. HYDRATION STATUS: Any signs of dehydration?  (e.g., dry mouth [not only dry lips], too weak to stand) When did you last urinate?     No 9. OTHER SYMPTOMS: Do you have any other symptoms? (e.g., fever, headache, vertigo, vomiting blood or coffee grounds, recent head injury)     Fever of 101 F  Protocols used: Vomiting-A-AH

## 2023-08-15 NOTE — Telephone Encounter (Signed)
 Noted

## 2023-08-16 ENCOUNTER — Ambulatory Visit: Admitting: Family Medicine

## 2023-08-16 ENCOUNTER — Encounter: Payer: Self-pay | Admitting: Family Medicine

## 2023-08-16 ENCOUNTER — Ambulatory Visit: Payer: Self-pay | Admitting: Family Medicine

## 2023-08-16 VITALS — BP 114/80 | HR 71 | Temp 97.7°F | Wt 241.1 lb

## 2023-08-16 DIAGNOSIS — R197 Diarrhea, unspecified: Secondary | ICD-10-CM

## 2023-08-16 DIAGNOSIS — E782 Mixed hyperlipidemia: Secondary | ICD-10-CM

## 2023-08-16 DIAGNOSIS — E559 Vitamin D deficiency, unspecified: Secondary | ICD-10-CM

## 2023-08-16 DIAGNOSIS — R112 Nausea with vomiting, unspecified: Secondary | ICD-10-CM

## 2023-08-16 LAB — CBC WITH DIFFERENTIAL/PLATELET
Basophils Absolute: 0 10*3/uL (ref 0.0–0.1)
Basophils Relative: 0.2 % (ref 0.0–3.0)
Eosinophils Absolute: 0.1 10*3/uL (ref 0.0–0.7)
Eosinophils Relative: 2 % (ref 0.0–5.0)
HCT: 44.8 % (ref 39.0–52.0)
Hemoglobin: 14.5 g/dL (ref 13.0–17.0)
Lymphocytes Relative: 56.5 % — ABNORMAL HIGH (ref 12.0–46.0)
Lymphs Abs: 2.4 10*3/uL (ref 0.7–4.0)
MCHC: 32.2 g/dL (ref 30.0–36.0)
MCV: 81.6 fl (ref 78.0–100.0)
Monocytes Absolute: 0.5 10*3/uL (ref 0.1–1.0)
Monocytes Relative: 10.8 % (ref 3.0–12.0)
Neutro Abs: 1.3 10*3/uL — ABNORMAL LOW (ref 1.4–7.7)
Neutrophils Relative %: 30.5 % — ABNORMAL LOW (ref 43.0–77.0)
Platelets: 243 10*3/uL (ref 150.0–400.0)
RBC: 5.5 Mil/uL (ref 4.22–5.81)
RDW: 14.5 % (ref 11.5–15.5)
WBC: 4.3 10*3/uL (ref 4.0–10.5)

## 2023-08-16 LAB — COMPREHENSIVE METABOLIC PANEL WITH GFR
ALT: 30 U/L (ref 0–53)
AST: 25 U/L (ref 0–37)
Albumin: 4.3 g/dL (ref 3.5–5.2)
Alkaline Phosphatase: 56 U/L (ref 39–117)
BUN: 11 mg/dL (ref 6–23)
CO2: 26 meq/L (ref 19–32)
Calcium: 9.4 mg/dL (ref 8.4–10.5)
Chloride: 104 meq/L (ref 96–112)
Creatinine, Ser: 1.02 mg/dL (ref 0.40–1.50)
GFR: 85.43 mL/min (ref >60.00)
Glucose, Bld: 102 mg/dL — ABNORMAL HIGH (ref 70–99)
Potassium: 3.6 meq/L (ref 3.5–5.1)
Sodium: 139 meq/L (ref 135–145)
Total Bilirubin: 0.5 mg/dL (ref 0.2–1.2)
Total Protein: 7.3 g/dL (ref 6.0–8.3)

## 2023-08-16 LAB — LIPID PANEL
Cholesterol: 164 mg/dL (ref 0–200)
HDL: 36.1 mg/dL — ABNORMAL LOW (ref >39.00)
LDL Cholesterol: 106 mg/dL — ABNORMAL HIGH (ref 0–99)
NonHDL: 127.45
Total CHOL/HDL Ratio: 5
Triglycerides: 106 mg/dL (ref 0.0–149.0)
VLDL: 21.2 mg/dL (ref 0.0–40.0)

## 2023-08-16 LAB — VITAMIN D 25 HYDROXY (VIT D DEFICIENCY, FRACTURES): VITD: 13.26 ng/mL — ABNORMAL LOW (ref 30.00–100.00)

## 2023-08-16 NOTE — Addendum Note (Signed)
 Addended by: METTA KRISTEN CROME on: 08/16/2023 07:56 AM   Modules accepted: Orders

## 2023-08-16 NOTE — Progress Notes (Signed)
 Established Patient Office Visit  Subjective   Patient ID: Jacob Williamson, male    DOB: [birth year]  Age: 53 y.o. MRN: [id number]  Chief Complaint  Patient presents with   Emesis   Nausea   Diarrhea    HPI   Jacob Williamson seen with nausea, vomiting, diarrhea symptoms.  He states he actually had onset of diarrhea at least 2 weeks ago.  Usually going about 2-3 times per day.  Some watery stools and some loose.  No blood.  He did not vomit any until couple days ago.  He states he had a grandson and son who had some vomiting he thinks the vomiting may not be related to the diarrhea symptoms.  He does have some chronic lactose intolerance and avoids milk and cheese.  He is also vegan.  Had colonoscopy 2021 which showed diverticular changes.  No history of diverticulitis.  Denies any associated abdominal pain.  He thinks he may have had some fever Monday but none since then.  No vomiting today thus far.  No right upper quadrant pain.  His chronic problems include history of B12 deficiency, hypertension, hyperlipidemia.  No known history of food allergies.  No history of gluten sensitivity.  Past Medical History:  Diagnosis Date   Asthma    past hx - not current    Diverticulosis    sigmoid colon and descending colon   GERD (gastroesophageal reflux disease)    Hyperlipidemia    no medications    Obesity (BMI 35.0-39.9 without comorbidity)    Past Surgical History:  Procedure Laterality Date   COLONOSCOPY  05/20/2019    reports that he has quit smoking. He has never used smokeless tobacco. He reports current alcohol use. He reports that he does not use drugs. family history includes Asthma in his father; Diabetes in his maternal grandmother and mother; Hypertension in his father and mother. Allergies  Allergen Reactions   Shellfish Allergy Anaphylaxis, Itching and Swelling   Catfish [Fish Allergy] Itching and Other (See Comments)    Mainly focused on throat and inside mouth, itching     Other     Brazilian nuts    Review of Systems  Constitutional:  Positive for chills and fever. Negative for weight loss.  Cardiovascular:  Negative for chest pain.  Gastrointestinal:  Positive for diarrhea, nausea and vomiting. Negative for abdominal pain, blood in stool, heartburn and melena.      Objective:     BP 114/80 (BP Location: Left Arm, Patient Position: Sitting, Cuff Size: Large)   Pulse 71   Temp 97.7 F (36.5 C) (Oral)   Wt 241 lb 1.6 oz (109.4 kg)   SpO2 96%   BMI 36.66 kg/m  BP Readings from Last 3 Encounters:  08/16/23 114/80  05/08/23 110/70  05/01/23 124/80   Wt Readings from Last 3 Encounters:  08/16/23 241 lb 1.6 oz (109.4 kg)  05/08/23 244 lb 14.4 oz (111.1 kg)  05/01/23 236 lb 3.2 oz (107.1 kg)      Physical Exam Vitals reviewed.  Constitutional:      General: He is not in acute distress.    Appearance: He is not toxic-appearing.  Cardiovascular:     Rate and Rhythm: Normal rate and regular rhythm.  Pulmonary:     Effort: Pulmonary effort is normal.     Breath sounds: Normal breath sounds.  Abdominal:     Palpations: Abdomen is soft.     Comments: Normal bowel sounds.  Abdomen is soft  and nontender.  No guarding or rebound.  Neurological:     Mental Status: He is alert.      No results found for any visits on 08/16/23.    The 10-year ASCVD risk score (Arnett DK, et al., 2019) is: 7.5%    Assessment & Plan:   Problem List Items Addressed This Visit   None Visit Diagnoses       Diarrhea, unspecified type    -  Primary   Relevant Orders   CBC with Differential/Platelet   CMP   Gastrointestinal Panel by PCR , Stool     Nausea and vomiting, unspecified vomiting type         Patient presents with over 2-week history of diarrhea stools.  He relates 2 days of some intermittent nausea and vomiting and he is not sure these are related since his diarrhea preceded the nausea/ vomiting.  Nausea somewhat improved today.  No  abdominal pain.  Question viral etiology.  Does not any red flags such as abdominal pain, weight loss, bloody stools, etc.  No recent travel or antibiotics.  Patient concerned about etiology such as Salmonella.  We explained usually Salmonella infection accompanied by higher fever and probably more profuse diarrhea  -Check labs with CBC and CMP.  Check stool GI pathogen panel by PCR - Discussed bland diet with handout given - Stay well-hydrated - Follow-up immediately for any recurrent fever, abdominal pain, or worsening diarrhea symptoms - If above normal and symptoms persist consider GI referral within the next week or 2  No follow-ups on file.    Jacob Scarlet, MD

## 2023-08-17 ENCOUNTER — Telehealth: Payer: Self-pay

## 2023-08-17 NOTE — Telephone Encounter (Signed)
 Noted

## 2023-08-17 NOTE — Telephone Encounter (Signed)
 Results read to patient per Dr. Parks note in chart: Electrolytes and liver panel normal. CBC normal except for elevated % lymphocytes- which might reflect recent viral illness. GI stool pathogen panel pending. Patient verbalizes understanding   Copied from CRM [id number]. Topic: Clinical - Lab/Test Results >> Aug 17, 2023 11:43 AM Jacob Williamson wrote: Reason for CRM: Patient wanting a nurse to contact him to go over lab results to get a better understanding of them

## 2023-08-22 ENCOUNTER — Telehealth: Payer: Self-pay

## 2023-08-22 ENCOUNTER — Ambulatory Visit: Payer: Self-pay | Admitting: Internal Medicine

## 2023-08-22 DIAGNOSIS — E559 Vitamin D deficiency, unspecified: Secondary | ICD-10-CM

## 2023-08-22 LAB — GASTROINTESTINAL PATHOGEN PNL

## 2023-08-22 MED ORDER — VITAMIN D (ERGOCALCIFEROL) 1.25 MG (50000 UNIT) PO CAPS: 50000.0000 [IU] | ORAL_CAPSULE | ORAL | 0 refills | Status: AC

## 2023-08-22 NOTE — Telephone Encounter (Signed)
 Left detailed message on vm informing patient of message below

## 2023-08-22 NOTE — Telephone Encounter (Signed)
 Copied from CRM [id number]. Topic: Clinical - Lab/Test Results >> Aug 22, 2023  8:58 AM Laymon HERO wrote: Reason for CRM: Patient calling to get an update on GI stool pathogen, he has not heard back about it.

## 2023-08-23 ENCOUNTER — Other Ambulatory Visit: Payer: Self-pay

## 2023-08-23 ENCOUNTER — Encounter (HOSPITAL_BASED_OUTPATIENT_CLINIC_OR_DEPARTMENT_OTHER): Payer: Self-pay | Admitting: *Deleted

## 2023-08-23 ENCOUNTER — Telehealth: Payer: Self-pay

## 2023-08-23 ENCOUNTER — Emergency Department (HOSPITAL_BASED_OUTPATIENT_CLINIC_OR_DEPARTMENT_OTHER)
Admission: EM | Admit: 2023-08-23 | Discharge: 2023-08-23 | Disposition: A | Discharge status: Home / Self Care | Payer: Worker's Compensation | Attending: Emergency Medicine | Admitting: Emergency Medicine

## 2023-08-23 DIAGNOSIS — Z7952 Long term (current) use of systemic steroids: Secondary | ICD-10-CM | POA: Insufficient documentation

## 2023-08-23 DIAGNOSIS — Y990 Civilian activity done for income or pay: Secondary | ICD-10-CM | POA: Insufficient documentation

## 2023-08-23 DIAGNOSIS — S8992XA Unspecified injury of left lower leg, initial encounter: Secondary | ICD-10-CM | POA: Diagnosis not present

## 2023-08-23 DIAGNOSIS — W208XXA Other cause of strike by thrown, projected or falling object, initial encounter: Secondary | ICD-10-CM | POA: Insufficient documentation

## 2023-08-23 DIAGNOSIS — Z87891 Personal history of nicotine dependence: Secondary | ICD-10-CM | POA: Insufficient documentation

## 2023-08-23 DIAGNOSIS — R197 Diarrhea, unspecified: Secondary | ICD-10-CM

## 2023-08-23 DIAGNOSIS — J45909 Unspecified asthma, uncomplicated: Secondary | ICD-10-CM | POA: Insufficient documentation

## 2023-08-23 NOTE — ED Notes (Signed)
 Pt d/c instructions, medications, and follow-up care reviewed with pt. Pt verbalized understanding and had no further questions at time of d/c. Pt CA&Ox4, ambulatory with steady gait, and in NAD at time of d/c

## 2023-08-23 NOTE — ED Provider Notes (Signed)
 [facility] AT [facility] Provider Note   CSN: [id number] Arrival date & time: 08/23/23  1635     Patient presents with: Leg Injury   Jacob Williamson is a 53 y.o. male.   HPI   53 year old male presents to the emergency department after work-related injury.  Works in [employer] facility and 1 fell on his steel toed shoe.  States that it was on the steel part never put pressure on his foot.  States that he was like this 5 minutes trying to hold the sheet up so would not fall on him before it was lifted off.  Was sent here by his supervisor for assessment.  Denies any pain, weakness or deformity.  Past medical history significant for asthma, diverticulosis, GERD, hyperlipidemia, obesity  Prior to Admission medications   Medication Sig Start Date End Date Taking? Authorizing Provider  albuterol  (VENTOLIN  HFA) 108 (90 Base) MCG/ACT inhaler Inhale 2 puffs into the lungs every 6 (six) hours as needed for wheezing or shortness of breath. 11/16/22   Johnny Garnette LABOR, MD  amLODipine  (NORVASC ) 5 MG tablet Take 1 tablet (5 mg total) by mouth daily. 11/22/21   Theophilus Andrews, Tully GRADE, MD  BLACK CURRANT SEED OIL PO Take by mouth daily.    [provider]  cyanocobalamin  (VITAMIN B12) 1000 MCG/ML injection Inject 1ml in deltoid once weekly for 4 weeks, then inject 1 ml once a month thereafter 05/01/23   Theophilus Andrews, Estela Y, MD  Multiple Vitamin (MULTIVITAMIN) tablet Take 1 tablet by mouth daily.    [provider]  nitroGLYCERIN  (NITRODUR - DOSED IN MG/24 HR) 0.2 mg/hr patch Place 1 patch (0.2 mg total) onto the skin daily. Place 1/4 of the patch over the affected area. 05/05/21   Joane Artist RAMAN, MD  predniSONE  (STERAPRED UNI-PAK 21 TAB) 10 MG (21) TBPK tablet Take as directed 05/08/23   Theophilus Andrews, Tully GRADE, MD  SYRINGE-NEEDLE, DISP, 3 ML (BD SAFETYGLIDE SYRINGE/NEEDLE) 25G X 1 3 ML MISC Use for B12 injections 05/01/23   Theophilus Andrews, Tully GRADE, MD  Vitamin D , Ergocalciferol , (DRISDOL ) 1.25 MG (50000 UNIT) CAPS capsule Take 1 capsule (50,000 Units total) by mouth every 7 (seven) days for 12 doses. 08/22/23 11/08/23  Theophilus Andrews Tully GRADE, MD    Allergies: Shellfish allergy, Catfish [fish allergy], and Other    Review of Systems  All other systems reviewed and are negative.   Updated Vital Signs BP (!) 136/96   Pulse 84   Temp 98.4 F (36.9 C)   Resp 18   SpO2 99%   Physical Exam Vitals and nursing note reviewed.  Constitutional:      General: He is not in acute distress.    Appearance: He is well-developed.  HENT:     Head: Normocephalic and atraumatic.  Eyes:     Conjunctiva/sclera: Conjunctivae normal.  Cardiovascular:     Rate and Rhythm: Normal rate and regular rhythm.     Heart sounds: No murmur heard. Pulmonary:     Effort: Pulmonary effort is normal. No respiratory distress.     Breath sounds: Normal breath sounds.  Abdominal:     Palpations: Abdomen is soft.     Tenderness: There is no abdominal tenderness.  Musculoskeletal:        General: No swelling.     Cervical back: Neck supple.     Comments: No tenderness bilateral ankles, feet.  Pedal posttibial tibial pulses 2+ bilaterally.  Full range  of motion of bilateral ankles, digits.  Skin:    General: Skin is warm and dry.     Capillary Refill: Capillary refill takes less than 2 seconds.  Neurological:     Mental Status: He is alert.  Psychiatric:        Mood and Affect: Mood normal.     (all labs ordered are listed, but only abnormal results are displayed) Labs Reviewed - No data to display  EKG: None  Radiology: No results found.   Procedures   Medications Ordered in the ED - No data to display                                  Medical Decision Making  This patient presents to the ED for concern of work-related injury, this involves an extensive number of treatment options, and is a complaint that carries with  it a high risk of complications and morbidity.  The differential diagnosis includes fracture, strain/pain, dislocation, ligamentous or tendon injury, neurovascular mice, osteoarthritis, septic arthritis, other   Co morbidities that complicate the patient evaluation  See HPI   Additional history obtained:  Additional history obtained from EMR External records from outside source obtained and reviewed including hospital records   Lab Tests:  N/a   Imaging Studies ordered:  N/a   Cardiac Monitoring: / EKG:  N/a   Consultations Obtained:  N/a   Problem List / ED Course / Critical interventions / Medication management  Work related injury Reevaluation of the patient showed that the patient stayed the same I have reviewed the patients home medicines and have made adjustments as needed   Social Determinants of Health:  Former cigarette use.  Denies illicit drug use.   Test / Admission - Considered:  Work-related injury Vitals signs within normal range and stable throughout visit. 53 year old male presents to the emergency department after work-related injury.  Works in [employer] facility and 1 fell on his steel toed shoe.  States that it was on the steel part never put pressure on his foot.  States that he was like this 5 minutes trying to hold the sheet up so would not fall on him before it was lifted off.  Was sent here by his supervisor for assessment.  Denies any pain, weakness or deformity. On exam, no reproducible tenderness or appreciable traumatic injury.  No pulse deficits.  Full range of motion of bilateral lower extremities.  Able to ambulate dependently without obvious gait deviation.  Given patient without any pain and without appreciable traumatic injury on exam, imaging studies were foregone.  Recommend routine follow-up with primary care.  Treatment plan discussed with patient and he acknowledged understanding was agreeable to said plan.  Patient  well-appearing, afebrile in no acute distress. Worrisome signs and symptoms were discussed with the patient, and the patient acknowledged understanding to return to the ED if noticed. Patient was stable upon discharge.       Final diagnoses:  Work related injury    ED Discharge Orders     None          Silver Wonda LABOR, [place] 08/23/23 1814    Elnor Bernarda SQUIBB, DO 08/23/23 1933

## 2023-08-23 NOTE — Discharge Instructions (Addendum)
 As discussed your ED visit was reassuring.  I do not think you need imaging studies at this time as you are not having pain.  Recommend follow-up with primary care for reassessment if you develop any new symptoms.  Please do not hesitate to return to emergency department if the worrisome signs and symptoms were discussed become apparent.

## 2023-08-23 NOTE — Telephone Encounter (Signed)
 It appears test was cancelled due to the Collection tube is incorrectly filled.

## 2023-08-23 NOTE — ED Triage Notes (Signed)
 Patient to ED reporting he had a large number of metal sheets fall on his left leg today at work. Patient was trapped under them for approximately 5 minutes and since has been having leg weakness and pain. No obvious injury or deformity.

## 2023-08-23 NOTE — Telephone Encounter (Signed)
 Copied from CRM [id number]. Topic: Clinical - Lab/Test Results >> Aug 22, 2023  8:58 AM [place] M wrote: Reason for CRM: Patient calling to get an update on GI stool pathogen, he has not heard back about it. >> Aug 23, 2023  1:50 PM Drema MATSU wrote: Patient states that on Mychart for his stoll sample result it says value cancelled, collection tube is correctly filled. Patient wants to know whats going on with his sample.

## 2023-08-24 NOTE — Addendum Note (Signed)
 Addended by: METTA MATTOCKS L on: 08/24/2023 08:30 AM   Modules accepted: Orders

## 2023-08-24 NOTE — Telephone Encounter (Signed)
 Patient reported he is still having bowel issues and would like to have repeat testing done. Labs orders have been placed.

## 2023-08-25 ENCOUNTER — Telehealth: Payer: Self-pay

## 2023-08-25 NOTE — Transitions of Care (Post Inpatient/ED Visit) (Signed)
   08/25/2023  Name: Jacob Williamson MRN: [id number] DOB: [birth year]  Today's TOC FU Call Status: Today's TOC FU Call Status:: Successful TOC FU Call Completed TOC FU Call Complete Date: 08/25/23 Patient's Name and Date of Birth confirmed.  Transition Care Management Follow-up Telephone Call Date of Discharge: 08/23/23 Discharge Facility: [facility] ([facility] Type of Discharge: Emergency Department Reason for ED Visit: Other: How have you been since you were released from the hospital?: Better Any questions or concerns?: No  Items Reviewed: Did you receive and understand the discharge instructions provided?: Yes Medications obtained,verified, and reconciled?: Yes (Medications Reviewed) Any new allergies since your discharge?: No Dietary orders reviewed?: No Do you have support at home?: Yes People in Home [RPT]: parent(s) Name of Support/Comfort Primary Source: beluah  Medications Reviewed Today: Medications Reviewed Today     Reviewed by Elner Shan NOVAK, CMA (Certified Medical Assistant) on 08/25/23 at 1122  Med List Status: <None>   Medication Order Taking? Sig Documenting Provider Last Dose Status Informant  albuterol  (VENTOLIN  HFA) 108 (90 Base) MCG/ACT inhaler [id number] Yes Inhale 2 puffs into the lungs every 6 (six) hours as needed for wheezing or shortness of breath. Johnny Garnette LABOR, MD  Active   amLODipine  (NORVASC ) 5 MG tablet [id number] Yes Take 1 tablet (5 mg total) by mouth daily. Theophilus Andrews, Tully GRADE, MD  Active   BLACK CURRANT SEED OIL PO [id number] Yes Take by mouth daily. [provider]  Active   cyanocobalamin  (VITAMIN B12) 1000 MCG/ML injection [id number] Yes Inject 1ml in deltoid once weekly for 4 weeks, then inject 1 ml once a month thereafter Theophilus Andrews, Estela Y, MD  Active   Multiple Vitamin (MULTIVITAMIN) tablet [id number] Yes Take 1 tablet by mouth daily. [provider]  Active   nitroGLYCERIN  (NITRODUR - DOSED IN  MG/24 HR) 0.2 mg/hr patch [id number] Yes Place 1 patch (0.2 mg total) onto the skin daily. Place 1/4 of the patch over the affected area. Joane Artist RAMAN, MD  Active   predniSONE  [facility] UNI-PAK 21 TAB) 10 MG (21) TBPK tablet [id number] Yes Take as directed Theophilus Andrews, Tully GRADE, MD  Active   SYRINGE-NEEDLE, DISP, 3 ML (BD SAFETYGLIDE SYRINGE/NEEDLE) 25G X 1 3 ML MISC [id number] Yes Use for B12 injections Hernandez Acosta, Tully GRADE, MD  Active   Vitamin D , Ergocalciferol , (DRISDOL ) 1.25 MG (50000 UNIT) CAPS capsule [id number] Yes Take 1 capsule (50,000 Units total) by mouth every 7 (seven) days for 12 doses. Theophilus Andrews, Tully GRADE, MD  Active             Home Care and Equipment/Supplies: Were Home Health Services Ordered?: No Any new equipment or medical supplies ordered?: No  Functional Questionnaire: Do you need assistance with bathing/showering or dressing?: No Do you need assistance with meal preparation?: No Do you need assistance with eating?: No Do you have difficulty maintaining continence: No Do you need assistance with getting out of bed/getting out of a chair/moving?: No Do you have difficulty managing or taking your medications?: No  Follow up appointments reviewed: PCP Follow-up appointment confirmed?: NA Specialist Hospital Follow-up appointment confirmed?: NA Do you need transportation to your follow-up appointment?: No Do you understand care options if your condition(s) worsen?: Yes-patient verbalized understanding    SIGNATURE Rashada Klontz,CMA

## 2023-12-15 ENCOUNTER — Other Ambulatory Visit: Payer: Self-pay | Admitting: Internal Medicine

## 2023-12-15 DIAGNOSIS — R062 Wheezing: Secondary | ICD-10-CM

## 2023-12-15 NOTE — Telephone Encounter (Signed)
 Copied from CRM [id number]. Topic: Clinical - Medication Refill >> Dec 15, 2023  2:00 PM Alexandria E wrote: Medication: albuterol  (VENTOLIN  HFA) 108 (90 Base) MCG/ACT inhaler  Has the patient contacted their pharmacy? No (Agent: If no, request that the patient contact the pharmacy for the refill. If patient does not wish to contact the pharmacy document the reason why and proceed with request.) (Agent: If yes, when and what did the pharmacy advise?)  This is the patient's preferred pharmacy:  [facility] [address], [place] - [address] [place] [address] [place] [address] Phone: [phone] Fax: [phone]   Is this the correct pharmacy for this prescription? Yes If no, delete pharmacy and type the correct one.   Has the prescription been filled recently? Yes  Is the patient out of the medication? No, 5 sprays left.  Has the patient been seen for an appointment in the last year OR does the patient have an upcoming appointment? Yes  Can we respond through MyChart? Yes  Agent: Please be advised that Rx refills may take up to 3 business days. We ask that you follow-up with your pharmacy.

## 2023-12-19 MED ORDER — ALBUTEROL SULFATE HFA 108 (90 BASE) MCG/ACT IN AERS: 2.0000 | INHALATION_SPRAY | Freq: Four times a day (QID) | RESPIRATORY_TRACT | 0 refills | Status: AC | PRN
